# Patient Record
Sex: Female | Born: 1960 | Race: Black or African American | Hispanic: No | Marital: Married | Smoking: Former smoker
Health system: Southern US, Community
[De-identification: ages and names within clinical notes are randomized; demographics above are authoritative.]

## PROBLEM LIST (undated history)

## (undated) DIAGNOSIS — I480 Paroxysmal atrial fibrillation: Secondary | ICD-10-CM

## (undated) DIAGNOSIS — D696 Thrombocytopenia, unspecified: Secondary | ICD-10-CM

## (undated) DIAGNOSIS — I10 Essential (primary) hypertension: Secondary | ICD-10-CM

## (undated) DIAGNOSIS — D259 Leiomyoma of uterus, unspecified: Secondary | ICD-10-CM

## (undated) DIAGNOSIS — E049 Nontoxic goiter, unspecified: Secondary | ICD-10-CM

## (undated) DIAGNOSIS — M1990 Unspecified osteoarthritis, unspecified site: Secondary | ICD-10-CM

## (undated) DIAGNOSIS — D649 Anemia, unspecified: Secondary | ICD-10-CM

## (undated) DIAGNOSIS — N979 Female infertility, unspecified: Secondary | ICD-10-CM

## (undated) DIAGNOSIS — D219 Benign neoplasm of connective and other soft tissue, unspecified: Secondary | ICD-10-CM

## (undated) DIAGNOSIS — E0590 Thyrotoxicosis, unspecified without thyrotoxic crisis or storm: Secondary | ICD-10-CM

## (undated) DIAGNOSIS — J209 Acute bronchitis, unspecified: Secondary | ICD-10-CM

## (undated) DIAGNOSIS — R079 Chest pain, unspecified: Secondary | ICD-10-CM

## (undated) DIAGNOSIS — E876 Hypokalemia: Secondary | ICD-10-CM

## (undated) DIAGNOSIS — I4891 Unspecified atrial fibrillation: Secondary | ICD-10-CM

## (undated) HISTORY — DX: Hypokalemia: E87.6

## (undated) HISTORY — DX: Nontoxic goiter, unspecified: E04.9

## (undated) HISTORY — DX: Thrombocytopenia, unspecified: D69.6

## (undated) HISTORY — DX: Unspecified atrial fibrillation: I48.91

## (undated) HISTORY — DX: Benign neoplasm of connective and other soft tissue, unspecified: D21.9

## (undated) HISTORY — DX: Hypomagnesemia: E83.42

## (undated) HISTORY — DX: Thyrotoxicosis, unspecified without thyrotoxic crisis or storm: E05.90

## (undated) HISTORY — DX: Acute bronchitis, unspecified: J20.9

## (undated) HISTORY — DX: Leiomyoma of uterus, unspecified: D25.9

## (undated) HISTORY — DX: Paroxysmal atrial fibrillation: I48.0

## (undated) HISTORY — DX: Chest pain, unspecified: R07.9

## (undated) HISTORY — DX: Anemia, unspecified: D64.9

## (undated) HISTORY — PX: ABDOMINAL HYSTERECTOMY: SHX81

## (undated) HISTORY — DX: Female infertility, unspecified: N97.9

## (undated) HISTORY — PX: THYROIDECTOMY: SHX17

---

## 2001-10-25 ENCOUNTER — Emergency Department (HOSPITAL_COMMUNITY): Admission: EM | Admit: 2001-10-25 | Discharge: 2001-10-25 | Payer: Self-pay | Admitting: Emergency Medicine

## 2001-10-27 ENCOUNTER — Emergency Department (HOSPITAL_COMMUNITY): Admission: EM | Admit: 2001-10-27 | Discharge: 2001-10-27 | Payer: Self-pay | Admitting: Emergency Medicine

## 2002-07-30 ENCOUNTER — Emergency Department (HOSPITAL_COMMUNITY): Admission: EM | Admit: 2002-07-30 | Discharge: 2002-07-30 | Payer: Self-pay | Admitting: Emergency Medicine

## 2003-01-28 ENCOUNTER — Encounter: Payer: Self-pay | Admitting: Emergency Medicine

## 2003-01-28 ENCOUNTER — Emergency Department (HOSPITAL_COMMUNITY): Admission: EM | Admit: 2003-01-28 | Discharge: 2003-01-28 | Payer: Self-pay | Admitting: Emergency Medicine

## 2003-06-30 ENCOUNTER — Emergency Department (HOSPITAL_COMMUNITY): Admission: EM | Admit: 2003-06-30 | Discharge: 2003-06-30 | Payer: Self-pay | Admitting: Emergency Medicine

## 2004-01-26 ENCOUNTER — Emergency Department (HOSPITAL_COMMUNITY): Admission: EM | Admit: 2004-01-26 | Discharge: 2004-01-26 | Payer: Self-pay | Admitting: Emergency Medicine

## 2004-02-24 ENCOUNTER — Emergency Department (HOSPITAL_COMMUNITY): Admission: EM | Admit: 2004-02-24 | Discharge: 2004-02-24 | Payer: Self-pay | Admitting: Emergency Medicine

## 2005-09-25 ENCOUNTER — Emergency Department (HOSPITAL_COMMUNITY): Admission: EM | Admit: 2005-09-25 | Discharge: 2005-09-25 | Payer: Self-pay | Admitting: Emergency Medicine

## 2005-10-30 ENCOUNTER — Ambulatory Visit: Payer: Self-pay | Admitting: Internal Medicine

## 2006-02-01 ENCOUNTER — Encounter: Payer: Self-pay | Admitting: Internal Medicine

## 2006-02-01 ENCOUNTER — Ambulatory Visit: Payer: Self-pay | Admitting: Internal Medicine

## 2006-05-23 ENCOUNTER — Emergency Department (HOSPITAL_COMMUNITY): Admission: EM | Admit: 2006-05-23 | Discharge: 2006-05-23 | Payer: Self-pay | Admitting: Emergency Medicine

## 2006-09-26 ENCOUNTER — Emergency Department (HOSPITAL_COMMUNITY): Admission: EM | Admit: 2006-09-26 | Discharge: 2006-09-27 | Payer: Self-pay | Admitting: Emergency Medicine

## 2006-10-02 ENCOUNTER — Ambulatory Visit: Payer: Self-pay | Admitting: *Deleted

## 2006-11-30 ENCOUNTER — Emergency Department (HOSPITAL_COMMUNITY): Admission: EM | Admit: 2006-11-30 | Discharge: 2006-11-30 | Payer: Self-pay | Admitting: Emergency Medicine

## 2007-01-24 ENCOUNTER — Emergency Department (HOSPITAL_COMMUNITY): Admission: EM | Admit: 2007-01-24 | Discharge: 2007-01-24 | Payer: Self-pay | Admitting: Emergency Medicine

## 2007-01-27 ENCOUNTER — Emergency Department (HOSPITAL_COMMUNITY): Admission: EM | Admit: 2007-01-27 | Discharge: 2007-01-27 | Payer: Self-pay | Admitting: Emergency Medicine

## 2007-02-26 ENCOUNTER — Encounter (INDEPENDENT_AMBULATORY_CARE_PROVIDER_SITE_OTHER): Payer: Self-pay | Admitting: *Deleted

## 2007-11-22 ENCOUNTER — Emergency Department (HOSPITAL_COMMUNITY): Admission: EM | Admit: 2007-11-22 | Discharge: 2007-11-23 | Payer: Self-pay | Admitting: Physician Assistant

## 2008-08-21 ENCOUNTER — Encounter: Payer: Self-pay | Admitting: Emergency Medicine

## 2008-08-22 ENCOUNTER — Inpatient Hospital Stay (HOSPITAL_COMMUNITY): Admission: AD | Admit: 2008-08-22 | Discharge: 2008-08-24 | Payer: Self-pay | Admitting: Obstetrics and Gynecology

## 2008-08-22 DIAGNOSIS — N739 Female pelvic inflammatory disease, unspecified: Secondary | ICD-10-CM | POA: Insufficient documentation

## 2008-09-09 ENCOUNTER — Encounter (INDEPENDENT_AMBULATORY_CARE_PROVIDER_SITE_OTHER): Payer: Self-pay | Admitting: Family Medicine

## 2008-09-09 ENCOUNTER — Ambulatory Visit: Payer: Self-pay | Admitting: Obstetrics and Gynecology

## 2008-10-19 ENCOUNTER — Ambulatory Visit (HOSPITAL_COMMUNITY): Admission: RE | Admit: 2008-10-19 | Discharge: 2008-10-19 | Payer: Self-pay | Admitting: Family Medicine

## 2008-10-21 ENCOUNTER — Ambulatory Visit: Payer: Self-pay | Admitting: Internal Medicine

## 2008-10-21 DIAGNOSIS — N979 Female infertility, unspecified: Secondary | ICD-10-CM

## 2008-10-21 HISTORY — DX: Female infertility, unspecified: N97.9

## 2008-10-21 LAB — CONVERTED CEMR LAB
Bilirubin Urine: NEGATIVE
Blood in Urine, dipstick: NEGATIVE
Nitrite: NEGATIVE
Protein, U semiquant: NEGATIVE
Specific Gravity, Urine: 1.025
WBC Urine, dipstick: NEGATIVE

## 2008-11-04 ENCOUNTER — Encounter (INDEPENDENT_AMBULATORY_CARE_PROVIDER_SITE_OTHER): Payer: Self-pay | Admitting: Internal Medicine

## 2008-12-01 ENCOUNTER — Ambulatory Visit: Payer: Self-pay | Admitting: Obstetrics & Gynecology

## 2009-01-05 ENCOUNTER — Emergency Department (HOSPITAL_COMMUNITY): Admission: EM | Admit: 2009-01-05 | Discharge: 2009-01-05 | Payer: Self-pay | Admitting: Family Medicine

## 2009-01-23 ENCOUNTER — Emergency Department (HOSPITAL_COMMUNITY): Admission: EM | Admit: 2009-01-23 | Discharge: 2009-01-23 | Payer: Self-pay | Admitting: Emergency Medicine

## 2009-06-30 ENCOUNTER — Ambulatory Visit: Payer: Self-pay | Admitting: Internal Medicine

## 2009-06-30 DIAGNOSIS — R109 Unspecified abdominal pain: Secondary | ICD-10-CM

## 2009-06-30 LAB — CONVERTED CEMR LAB
Basophils Relative: 0 % (ref 0–1)
Beta hcg, urine, semiquantitative: NEGATIVE
Eosinophils Absolute: 0.2 10*3/uL (ref 0.0–0.7)
Eosinophils Relative: 2 % (ref 0–5)
HCT: 39.3 % (ref 36.0–46.0)
Lymphs Abs: 2.5 10*3/uL (ref 0.7–4.0)
MCHC: 31.8 g/dL (ref 30.0–36.0)
MCV: 86.9 fL (ref 78.0–100.0)
Monocytes Relative: 10 % (ref 3–12)
Nitrite: NEGATIVE
Platelets: 226 10*3/uL (ref 150–400)
Urobilinogen, UA: 0.2
WBC Urine, dipstick: NEGATIVE
WBC: 6.7 10*3/uL (ref 4.0–10.5)
Whiff Test: NEGATIVE
pH: 5

## 2009-07-07 ENCOUNTER — Ambulatory Visit (HOSPITAL_COMMUNITY): Admission: RE | Admit: 2009-07-07 | Discharge: 2009-07-07 | Payer: Self-pay | Admitting: Internal Medicine

## 2009-07-08 ENCOUNTER — Telehealth (INDEPENDENT_AMBULATORY_CARE_PROVIDER_SITE_OTHER): Payer: Self-pay | Admitting: Internal Medicine

## 2009-07-11 DIAGNOSIS — D259 Leiomyoma of uterus, unspecified: Secondary | ICD-10-CM | POA: Insufficient documentation

## 2009-07-11 HISTORY — DX: Leiomyoma of uterus, unspecified: D25.9

## 2010-07-06 ENCOUNTER — Emergency Department (HOSPITAL_COMMUNITY)
Admission: EM | Admit: 2010-07-06 | Discharge: 2010-07-06 | Payer: Self-pay | Source: Home / Self Care | Admitting: Emergency Medicine

## 2010-07-06 LAB — URINALYSIS, ROUTINE W REFLEX MICROSCOPIC
Hgb urine dipstick: NEGATIVE
Specific Gravity, Urine: 1.028 (ref 1.005–1.030)
Urine Glucose, Fasting: NEGATIVE mg/dL
pH: 6 (ref 5.0–8.0)

## 2010-07-06 LAB — CBC
HCT: 37.7 % (ref 36.0–46.0)
Hemoglobin: 12.5 g/dL (ref 12.0–15.0)
MCV: 86.3 fL (ref 78.0–100.0)
WBC: 5.6 10*3/uL (ref 4.0–10.5)

## 2010-07-06 LAB — POCT I-STAT, CHEM 8
Calcium, Ion: 1.14 mmol/L (ref 1.12–1.32)
Creatinine, Ser: 0.9 mg/dL (ref 0.4–1.2)
Glucose, Bld: 110 mg/dL — ABNORMAL HIGH (ref 70–99)
HCT: 40 % (ref 36.0–46.0)
Hemoglobin: 13.6 g/dL (ref 12.0–15.0)
Potassium: 3.9 mEq/L (ref 3.5–5.1)
Sodium: 140 mEq/L (ref 135–145)

## 2010-07-06 LAB — POCT PREGNANCY, URINE: Preg Test, Ur: NEGATIVE

## 2010-07-06 LAB — DIFFERENTIAL
Basophils Relative: 0 % (ref 0–1)
Eosinophils Absolute: 0.2 10*3/uL (ref 0.0–0.7)
Eosinophils Relative: 3 % (ref 0–5)
Lymphs Abs: 1.8 10*3/uL (ref 0.7–4.0)
Monocytes Relative: 6 % (ref 3–12)

## 2010-07-06 LAB — WET PREP, GENITAL: Yeast Wet Prep HPF POC: NONE SEEN

## 2010-07-07 ENCOUNTER — Emergency Department (HOSPITAL_COMMUNITY)
Admission: EM | Admit: 2010-07-07 | Discharge: 2010-07-07 | Payer: Self-pay | Source: Home / Self Care | Admitting: Emergency Medicine

## 2010-07-07 LAB — URINALYSIS, ROUTINE W REFLEX MICROSCOPIC
Hgb urine dipstick: NEGATIVE
Nitrite: NEGATIVE
Specific Gravity, Urine: 1.024 (ref 1.005–1.030)
Urobilinogen, UA: 0.2 mg/dL (ref 0.0–1.0)

## 2010-07-07 LAB — CBC
Hemoglobin: 12.6 g/dL (ref 12.0–15.0)
MCH: 28.3 pg (ref 26.0–34.0)
MCHC: 33.1 g/dL (ref 30.0–36.0)
Platelets: 237 10*3/uL (ref 150–400)
RBC: 4.45 MIL/uL (ref 3.87–5.11)
RDW: 14.1 % (ref 11.5–15.5)

## 2010-07-07 LAB — COMPREHENSIVE METABOLIC PANEL
ALT: 21 U/L (ref 0–35)
AST: 24 U/L (ref 0–37)
Alkaline Phosphatase: 83 U/L (ref 39–117)
CO2: 23 mEq/L (ref 19–32)
Chloride: 104 mEq/L (ref 96–112)
GFR calc non Af Amer: >60 mL/min (ref >60)
Sodium: 134 mEq/L — ABNORMAL LOW (ref 135–145)
Total Bilirubin: 0.3 mg/dL (ref 0.3–1.2)

## 2010-07-07 LAB — DIFFERENTIAL
Basophils Absolute: 0 10*3/uL (ref 0.0–0.1)
Basophils Relative: 0 % (ref 0–1)
Eosinophils Absolute: 0.1 10*3/uL (ref 0.0–0.7)
Eosinophils Relative: 1 % (ref 0–5)
Lymphs Abs: 1.6 10*3/uL (ref 0.7–4.0)

## 2010-07-07 LAB — GC/CHLAMYDIA PROBE AMP, GENITAL: Chlamydia, DNA Probe: NEGATIVE

## 2010-07-07 LAB — LIPASE, BLOOD: Lipase: 13 U/L (ref 11–59)

## 2010-07-11 NOTE — Assessment & Plan Note (Signed)
Summary: STOMACH HURTING REALLY BAD//SS   Vital Signs:  Patient profile:   50 year old female Menstrual status:  regular LMP:     05/11/2009 Weight:      268. pounds Temp:     97.2 degrees F oral Pulse rate:   66 / minute Pulse rhythm:   regular Resp:     18 per minute BP sitting:   133 / 82  (left arm) Cuff size:   large  Vitals Entered By: Geanie Cooley  (June 30, 2009 4:18 PM) CC: pt states she has been having stomach painson her right side since December, she thought it was from her cycle and it was just cramping but it has continued. Pt states that she wants to see a fertility doctor about helping her to get pregnant, pt states she wants to see if maybe her ovaries are blocked. Pain Assessment Patient in pain? yes     Location: abdomen Intensity: 8 Type: sharp Onset of pain  december  Does patient need assistance? Functional Status Self care Ambulation Normal Comments pt states she wanted to go to a fertility clinic, she was to obtain the doctor information and Dr. Delrae Alfred will see about referring her to one. Pt gave me information for a [facility]rolog[facility] Dr. Lynelle Smoke  not a fertility doctor call pt back and left message to call us back. LMP (date): 05/11/2009     Menstrual Status regular Enter LMP: 05/11/2009 Last PAP Result NEGATIVE FOR INTRAEPITHELIAL LESIONS OR MALIGNANCY.   CC:  pt states she has been having stomach painson her right side since December, she thought it was from her cycle and it was just cramping but it has continued. Pt states that she wants to see a fertility doctor about helping her to get pregnant, and pt states she wants to see if maybe her ovaries are blocked.Olivia Schmidt  History of Present Illness: 1.  Right sided pelvic/ lower abdominal pains since Christmas.  Feels like menstrual cramps.  Comes and goes.  Has at least every other day.  Can last anywhere from an hour to 24 hours.  LMP was the 1st of December.  Has not had a  period yet this month.  Feels like she needs to start her period.  No breast tenderness.  Was pregnant at age 64, but cannot recall what it felt like.  No nausea, diarrhea, constipation.  No dysuria.  No improvement with BM or urinating.  No vaginal discharge.  No fever.  This does not feel like it did with PID.    2.  Wants to go to a fertility specialist.  Discussed reality of having PID, advanced age, never able to get pregnant in past 15 years of marriage and concern for the cost, which generally is all out of pocket and quite expensive.  Pt. states her husband is seeing a specialist who has him on an unknown med to increase sperm count.  She can go to him as well.  She just needs to send her records.  Allergies (verified): No Known Drug Allergies  Physical Exam  General:  Morbidly obese, NAD Lungs:  Normal respiratory effort, chest expands symmetrically. Lungs are clear to auscultation, no crackles or wheezes. Heart:  Normal rate and regular rhythm. S1 and S2 normal without gallop, murmur, click, rub or other extra sounds. Abdomen:  No flank tenderness.  Soft, +BS, no HSM, no mass.  Tender RLQ--,mild Genitalia:  Scant thin white discharge.  No odor.  No cervical lesion.  No CMT.  Tender right adnexa, no mass appreciated, but limited with pt's size.  No uterine or left adnexal mass or tenderness.   Impression & Recommendations:  Problem # 1:  PELVIC PAIN, RIGHT (ICD-789.09) Suspect ovarian cyst. Unlikely an ectopic Orders: Urine Pregnancy Test  (16109) UA Dipstick w/o Micro (manual) (60454) T-CBC w/Diff (09811-91478) T- GC Chlamydia (29562) Pelvic ultrasound  Problem # 2:  FEMALE INFERTILITY (ICD-628.9) Long discussion regarding the reality of difficulty getting pregnant at this stage with age, husband's issues, and hx of PID.   Pt. just wants to see if any possibility. Counseled on adoption if unable   Other Orders: Ultrasound (Ultrasound)  Patient Instructions: 1)  Call  with name of fertility specialist  Laboratory Results   Urine Tests  Date/Time Received: June 30, 2009 6:10 PM   Routine Urinalysis   Color: lt. yellow Appearance: Clear Glucose: negative   (Normal Range: Negative) Bilirubin: negative   (Normal Range: Negative) Ketone: smal (15)   (Normal Range: Negative) Spec. Gravity: >=1.030   (Normal Range: 1.003-1.035) Blood: trace-intact   (Normal Range: Negative) pH: 5.0   (Normal Range: 5.0-8.0) Protein: negative   (Normal Range: Negative) Urobilinogen: 0.2   (Normal Range: 0-1) Nitrite: negative   (Normal Range: Negative) Leukocyte Esterace: negative   (Normal Range: Negative)    Urine HCG: negative   Wet Mount/KOH Source: vaginal WBC/hpf: 1-5 Bacteria/hpf: 1+ Clue cells/hpf: none  Negative whiff Yeast/hpf: none Trichomonas/hpf: none   Laboratory Results   Urine Tests    Routine Urinalysis   Color: lt. yellow Appearance: Clear Glucose: negative   (Normal Range: Negative) Bilirubin: negative   (Normal Range: Negative) Ketone: smal (15)   (Normal Range: Negative) Spec. Gravity: >=1.030   (Normal Range: 1.003-1.035) Blood: trace-intact   (Normal Range: Negative) pH: 5.0   (Normal Range: 5.0-8.0) Protein: negative   (Normal Range: Negative) Urobilinogen: 0.2   (Normal Range: 0-1) Nitrite: negative   (Normal Range: Negative) Leukocyte Esterace: negative   (Normal Range: Negative)    Urine HCG: negative   Wet Mount  Negative whiff Wet Mount KOH: Negative     Vital Signs:  Patient profile:   50 year old female Menstrual status:  regular LMP:     05/11/2009 Weight:      268. pounds Temp:     97.2 degrees F oral Pulse rate:   66 / minute Pulse rhythm:   regular Resp:     18 per minute BP sitting:   133 / 82  (left arm) Cuff size:   large  Vitals Entered By: Geanie Cooley  (June 30, 2009 4:18 PM)

## 2010-07-11 NOTE — Progress Notes (Signed)
Summary: Requesting for lab results   Phone Note Call from Patient Call back at Home Phone [phone]   Summary of Call: Pt wants to get her lab results.  Blakelynn Scheeler Md Initial call taken by: Manon Hilding,  July 08, 2009 3:46 PM  Follow-up for Phone Call        fwd for results. Follow-up by: Vesta Mixer CMA,  July 08, 2009 3:53 PM  Additional Follow-up for Phone Call Additional follow up Details #1::        Let her know her labs were fine.  Her pelvic ultrasound showed 3 fibroids of the uterus which are unchanged from previous studies--when she had the CT of her pelvis at the hospital, these were noted--they are not cancerous.   See if she is still having pelvic discomfort. Did she ever get the name of the fertility specialist as well--last I heard, he may have been a urologist, who would not do female infertility Additional Follow-up by: Julieanne Manson MD,  July 11, 2009 1:54 PM    Additional Follow-up for Phone Call Additional follow up Details #2::    Pt notified.  She said she is still having some pelvic discomfort.  She will look for name of fertility doctor. Follow-up by: Vesta Mixer CMA,  July 11, 2009 2:59 PM  Additional Follow-up for Phone Call Additional follow up Details #3:: Details for Additional Follow-up Action Taken: She actually was going to give me the name of the fertility specialist her husband goes to--was that a urologist?  Julieanne Manson MD  July 12, 2009 6:03 PM   Spoke with pt her husband doctor name is Dr. Lynelle Smoke L. Tannebaum............... Vesta Mixer CMA  July 15, 2009 5:07 PM  Please let her know that that he is not a fertility physician--he is a urologist and the urologists are also refusing to see our patients.  Julieanne Manson MD  July 21, 2009 6:10 PM   Pt notified and she will look around for a fertility specialist. Additional Follow-up by: Vesta Mixer CMA,  July 26, 2009 9:09 AM

## 2010-07-11 NOTE — Letter (Signed)
Summary: REFERRAL/ULTRASOUND  REFERRAL/ULTRASOUND   Imported By: Arta Bruce 08/23/2009 12:52:11  _____________________________________________________________________  External Attachment:    Type:   Image     Comment:   External Document

## 2010-08-29 ENCOUNTER — Inpatient Hospital Stay (HOSPITAL_COMMUNITY)
Admission: AD | Admit: 2010-08-29 | Discharge: 2010-08-29 | Disposition: A | Discharge status: Home / Self Care | Payer: Self-pay | Source: Ambulatory Visit | Attending: Obstetrics and Gynecology | Admitting: Obstetrics and Gynecology

## 2010-08-29 DIAGNOSIS — R1031 Right lower quadrant pain: Secondary | ICD-10-CM

## 2010-08-29 DIAGNOSIS — D259 Leiomyoma of uterus, unspecified: Secondary | ICD-10-CM | POA: Insufficient documentation

## 2010-09-11 ENCOUNTER — Other Ambulatory Visit: Payer: Self-pay | Admitting: Family Medicine

## 2010-09-11 ENCOUNTER — Encounter: Payer: Self-pay | Admitting: Occupational Therapy

## 2010-09-11 ENCOUNTER — Other Ambulatory Visit: Payer: Self-pay | Admitting: Obstetrics & Gynecology

## 2010-09-11 DIAGNOSIS — N949 Unspecified condition associated with female genital organs and menstrual cycle: Secondary | ICD-10-CM

## 2010-09-11 DIAGNOSIS — Z1231 Encounter for screening mammogram for malignant neoplasm of breast: Secondary | ICD-10-CM

## 2010-09-11 DIAGNOSIS — D259 Leiomyoma of uterus, unspecified: Secondary | ICD-10-CM

## 2010-09-12 NOTE — Progress Notes (Signed)
NAMEERIENNE, SPELMAN NO.:  [id number]  MEDICAL RECORD NO.:  [id number]           PATIENT TYPE:  A  LOCATION:  [facility]                   FACILITY:  [facility]  PHYSICIAN:  Allie Bossier, MD        DATE OF BIRTH:  1960-06-30  DATE OF SERVICE:  09/11/2010                                 CLINIC NOTE  IDENTIFICATION:  [name] is a 50 year old married black G1, P0, A1 who comes here complaining of painful periods.  Her periods have become significantly heavier and her pelvic pain (right greater than left) is daily.  She has been given Percocet and diclofenac which she has recently run out of it, she is currently using Aleve 3 times a day.  An ultrasound was done as they entered the [facility]U on July 07, 2010, and it showed a uterus that measures 10 x 5.6 x 6.2 with several fibroids, one of which in the fundus measures 5.8 x 5.3 x 5.4 cm.  There are other smaller fibroid some was partial submucosal component.  Her endometrium is normal at 6.4 mm and both ovaries are normal.  Other finding shows a trace of simple free fluid in the cul-de-sac.  PAST MEDICAL HISTORY:  Morbid obesity, fibroids, and chronic pelvic pain for last 10 years.  REVIEW OF SYSTEMS:  She has been married for last 18 years.  She has had dyspareunia for at least 10 of those years.  She has been having unprotected intercourse since she was 50 years old.  Her husband has had lymphoma and status post chemotherapy, but he seems to be doing well now.  She works as a Lawyer in [place].  Her last Pap smear was at [facility], and is currently due and it was always normal.  She says her mammogram is due and that her last mammogram was abnormal, but she did not followup.  PAST SURGICAL HISTORY:  She has elective AB at age 78.  FAMILY HISTORY:  Negative for breast, GYN, and colon malignancies.  No known drug allergies.  No latex allergies.  SOCIAL HISTORY:  She quit smoking 10 years ago but prior to that had  a 40 pack-year history.  MEDICATIONS: 1. She takes Aleve on a p.r.n. basis. 2. She recently finished Percocet and diclofenac.  PHYSICAL EXAMINATION:  GENERAL:  A well-nourished, well-hydrated very pleasant obese black female. VITAL SIGNS:  Weight 271 pounds, blood pressure 131/79, pulse 68, afebrile. HEENT:  Normal. HEART:  Regular rate and rhythm. LUNGS:  Clear to auscultation bilaterally. BREASTS:  Normal bilaterally. ABDOMEN:  Obese and tense. EXTERNAL GENITALIA:  Normal.  Cervix appears normal and a Pap smear was obtained.  Bimanual exam is essentially noncontributory since I am not able to feel her pelvic organs due to her centripetal obesity and the tense nature of her abdomen, her exam is however nontender.  ASSESSMENT AND PLAN: 1. Annual exam.  We will check her Pap smear, scheduled a mammogram.     Recommended weight loss, self-breast exam, and self-vulvar exam. 2. Symptomatic uterine fibroids and chronic pelvic pain.  After     discussion of risks, benefits, and alternatives, she  prefers to     have the robotic hysterectomy.  I have explained that I would     recommend removing her ovaries as well since she does have chronic     pelvic pain.  The surgery be scheduled in May and will await the     results of her Pap smear and mammogram.     Allie Bossier, MD    MCD/MEDQ  D:  09/11/2010  T:  09/12/2010  Job:  [id number]

## 2010-09-19 ENCOUNTER — Ambulatory Visit (HOSPITAL_COMMUNITY)
Admission: RE | Admit: 2010-09-19 | Discharge: 2010-09-19 | Disposition: A | Discharge status: Home / Self Care | Payer: Self-pay | Source: Ambulatory Visit | Attending: Family Medicine | Admitting: Family Medicine

## 2010-09-19 DIAGNOSIS — Z1231 Encounter for screening mammogram for malignant neoplasm of breast: Secondary | ICD-10-CM | POA: Insufficient documentation

## 2010-09-21 LAB — CBC
HCT: 32.3 % — ABNORMAL LOW (ref 36.0–46.0)
Hemoglobin: 10.9 g/dL — ABNORMAL LOW (ref 12.0–15.0)
Hemoglobin: 12.4 g/dL (ref 12.0–15.0)
Hemoglobin: 10.6 g/dL — ABNORMAL LOW (ref 12.0–15.0)
MCV: 86.9 fL (ref 78.0–100.0)
Platelets: 153 10*3/uL (ref 150–400)
RBC: 3.8 MIL/uL — ABNORMAL LOW (ref 3.87–5.11)
RBC: 4.3 MIL/uL (ref 3.87–5.11)
RBC: 3.7 MIL/uL — ABNORMAL LOW (ref 3.87–5.11)
WBC: 8.3 10*3/uL (ref 4.0–10.5)
WBC: 14.3 10*3/uL — ABNORMAL HIGH (ref 4.0–10.5)
WBC: 13.4 10*3/uL — ABNORMAL HIGH (ref 4.0–10.5)

## 2010-09-21 LAB — URINALYSIS, ROUTINE W REFLEX MICROSCOPIC
Glucose, UA: NEGATIVE mg/dL
Specific Gravity, Urine: 1.031 — ABNORMAL HIGH (ref 1.005–1.030)
Urobilinogen, UA: 1 mg/dL (ref 0.0–1.0)
pH: 5.5 (ref 5.0–8.0)

## 2010-09-21 LAB — LIPASE, BLOOD: Lipase: 13 U/L (ref 11–59)

## 2010-09-21 LAB — DIFFERENTIAL
Basophils Absolute: 0.1 10*3/uL (ref 0.0–0.1)
Basophils Relative: 1 % (ref 0–1)
Basophils Relative: 1 % (ref 0–1)
Eosinophils Absolute: 0 10*3/uL (ref 0.0–0.7)
Eosinophils Relative: 0 % (ref 0–5)
Eosinophils Relative: 0 % (ref 0–5)
Lymphocytes Relative: 14 % (ref 12–46)
Lymphocytes Relative: 7 % — ABNORMAL LOW (ref 12–46)
Lymphs Abs: 1.1 10*3/uL (ref 0.7–4.0)
Lymphs Abs: 0.9 10*3/uL (ref 0.7–4.0)
Monocytes Absolute: 0.8 10*3/uL (ref 0.1–1.0)
Monocytes Absolute: 1 10*3/uL (ref 0.1–1.0)
Monocytes Relative: 10 % (ref 3–12)
Neutro Abs: 6.2 10*3/uL (ref 1.7–7.7)
Neutrophils Relative %: 74 % (ref 43–77)

## 2010-09-21 LAB — URINE CULTURE

## 2010-09-21 LAB — COMPREHENSIVE METABOLIC PANEL
ALT: 14 U/L (ref 0–35)
AST: 17 U/L (ref 0–37)
CO2: 23 mEq/L (ref 19–32)
Chloride: 102 mEq/L (ref 96–112)
Creatinine, Ser: 0.83 mg/dL (ref 0.4–1.2)
GFR calc Af Amer: >60 mL/min (ref >60)
GFR calc non Af Amer: >60 mL/min (ref >60)
Sodium: 132 mEq/L — ABNORMAL LOW (ref 135–145)
Total Bilirubin: 1.1 mg/dL (ref 0.3–1.2)

## 2010-09-21 LAB — URINE MICROSCOPIC-ADD ON

## 2010-09-21 LAB — CA 125: CA 125: 20.8 U/mL (ref 0.0–30.2)

## 2010-09-22 ENCOUNTER — Inpatient Hospital Stay (HOSPITAL_COMMUNITY)
Admission: AD | Admit: 2010-09-22 | Discharge: 2010-09-22 | Disposition: A | Discharge status: Home / Self Care | Payer: Self-pay | Source: Ambulatory Visit | Attending: Obstetrics & Gynecology | Admitting: Obstetrics & Gynecology

## 2010-09-22 DIAGNOSIS — R109 Unspecified abdominal pain: Secondary | ICD-10-CM | POA: Insufficient documentation

## 2010-09-22 DIAGNOSIS — D259 Leiomyoma of uterus, unspecified: Secondary | ICD-10-CM | POA: Insufficient documentation

## 2010-10-18 ENCOUNTER — Ambulatory Visit: Payer: Self-pay | Admitting: Obstetrics & Gynecology

## 2010-10-18 ENCOUNTER — Other Ambulatory Visit: Payer: Self-pay | Admitting: Obstetrics & Gynecology

## 2010-10-18 ENCOUNTER — Encounter (HOSPITAL_COMMUNITY): Payer: Self-pay | Attending: Obstetrics & Gynecology

## 2010-10-18 DIAGNOSIS — Z01812 Encounter for preprocedural laboratory examination: Secondary | ICD-10-CM | POA: Insufficient documentation

## 2010-10-18 DIAGNOSIS — Z124 Encounter for screening for malignant neoplasm of cervix: Secondary | ICD-10-CM

## 2010-10-18 DIAGNOSIS — N949 Unspecified condition associated with female genital organs and menstrual cycle: Secondary | ICD-10-CM

## 2010-10-18 DIAGNOSIS — Z01818 Encounter for other preprocedural examination: Secondary | ICD-10-CM | POA: Insufficient documentation

## 2010-10-18 LAB — CBC
HCT: 38.5 % (ref 36.0–46.0)
MCV: 87.5 fL (ref 78.0–100.0)
RDW: 14.2 % (ref 11.5–15.5)
WBC: 7.7 10*3/uL (ref 4.0–10.5)

## 2010-10-19 NOTE — Group Therapy Note (Signed)
NAMERUPINDER, LIVINGSTON               ACCOUNT NO.:  [id number]  MEDICAL RECORD NO.:  [id number]           PATIENT TYPE:  O  LOCATION:  [facility]                    [facility]  PHYSICIAN:  Allie Bossier, MD        DATE OF BIRTH:  1960/06/25  DATE OF SERVICE:  10/18/2010                                 CLINIC NOTE  [name] is a 50 year old married black G1, P0, A1 who is scheduled for robotic laparoscopic T hysterectomy and BSO next week.  She comes in because she needed a followup Pap smear.  Her past Pap smear which was done on September 11, 2010, came back with Trichomonas as well as absent transformational/endocervical cells.  She says that she and her partner have both taken the Flagyl, and she thinks that the vaginal smell has resolved.  On exam today, I am doing a wet prep.  I do not see any abnormal discharge.  We did her Pap smear as well.  She is requesting a refill of her Percocet and says that she would prefer the 10 mg over the 5 since the 5 is not helping her pain.  We once again discussed her surgery.  I have given her 40 of the Percocet 10 mg/325 mg.  These will be used preoperatively and postoperatively again risks of surgery explained, understood, and accepted.     Allie Bossier, MD    MCD/MEDQ  D:  10/18/2010  T:  10/19/2010  Job:  [id number]

## 2010-10-24 NOTE — Group Therapy Note (Signed)
Olivia Schmidt, Schmidt NO.:  [id number]   MEDICAL RECORD NO.:  [id number]          PATIENT TYPE:  WOC   LOCATION:  [facility]                   FACILITY:  [facility]   PHYSICIAN:  Argentina Donovan, MD        DATE OF BIRTH:  04-08-1961   DATE OF SERVICE:  09/09/2008                                  CLINIC NOTE   REASON FOR VISIT:  Follow-up for hospitalization for PID.   HISTORY OF PRESENT ILLNESS:  [name] is a 50 year old gravida  1, para 0 0 1 0 who is here for follow-up for recent hospitalization for  PID.  The patient presented to the [facility] on August 21, 2008 with acutely  worsening abdominal pain and fevers. An ultrasound revealed a 4 cm  fibroid as well as a left ovarian complex cystic lesion with thick  septations approximately 6x7 cm.  Initially she had a white count of  14,000.  Patient was hospitalized and treated with antibiotics with  which she improved. She went home on 2-week course of doxycycline and  Flagyl.  She is here today for follow-up.  She relates improvement her  pain, although she does have some mid epigastric pain which existed  prior to this episode.  She notes a thick whitish vaginal discharge with  some redness and itchiness of her vaginal area.   PAST MEDICAL HISTORY:  None.   PAST SURGICAL HISTORY:  I and D  of some abscesses.   PAST OBSTETRICAL HISTORY:  She had elective termination.   PAST GYN HISTORY:  She has a remote history of gonorrhea at age17.  She  has not had a Pap smear in over 5 years.   SOCIAL HISTORY:  She denies tobacco and alcohol.   MEDICATIONS:  She states she is not taking any medications.   ALLERGIES:  She has no known allergies.   REVIEW OF SYSTEMS:  The patient relates a long history of infertility  for which she has previously been evaluated and states that she recalled  she had one tube blocked. Otherwise she had no problems.  She relates  monthly regular menstrual cycles.  She also does note that on  one  occasion her husband tried to have semen analysis, but it was reported  that he did not produce enough semen for them to do the analysis.   PHYSICAL EXAM:  VITAL SIGNS:  Blood pressure is 123/66, heart rate 68,  temperature 97, respiratory rate 20.  She is 5 feet 4 inches tall and  weighs 245 pounds.  GENERAL:  She is healthy-appearing, in no acute distress.  LUNGS:  Clear to auscultation.  CARDIOVASCULAR:  Regular.  ABDOMEN:  Soft. Some mild midepigastric tenderness.  No lower quadrant  tenderness.  GU: Her external genitalia is normal. Vaginal mucosa is normal.  She has  some thick whitish vaginal discharge.  Cervix is nulliparous-appearing  without lesion. There is no cervical motion tenderness and no mass could  be appreciated in the adnexal areas.  Uterus is normal size.  BREASTS: Her breasts were large and pendulous without evidence of  discrete masses or  nodules.  She had no axillary adenopathy.   ASSESSMENT/PLAN:  1. Tubo-ovarian abscess. This appears to be resolved. Most likely      etiology of her complex cystic mass.  However, given that large      cyst a follow-up ultrasound will be scheduled in 6 weeks from now      to document resolution of the cyst.  2. Infertility.  Given the patient's history I gave her a prescription      for her husband to have a semen analysis.  3. Vaginal candidiasis. Prescription for Diflucan was given.  4. Gastroesophageal reflux disease.  I gave the patient prescription      for a 1 month trial of Pepcid and told her that she is to follow up      with her primary care Kongmeng Santoro for further management of this      problem.  Pap smear was done today and the patient was also given      an appointment for a mammogram as well.     ______________________________  Odie Sera, D.O.    ______________________________  Argentina Donovan, MD    MC/MEDQ  D:  09/09/2008  T:  09/09/2008  Job:  [id number]

## 2010-10-24 NOTE — Discharge Summary (Signed)
Olivia Schmidt, Olivia Schmidt               ACCOUNT NO.:  [id number]   MEDICAL RECORD NO.:  [id number]          PATIENT TYPE:  INP   LOCATION:  9306                          [facility]   PHYSICIAN:  Tanya S. Shawnie Pons, M.D.   DATE OF BIRTH:  Oct 11, 1960   DATE OF ADMISSION:  08/22/2008  DATE OF DISCHARGE:  08/24/2008                               DISCHARGE SUMMARY   DIAGNOSES:  1. Pelvic inflammatory disease with a left tubo-ovarian abscess.  2. Obesity.  3. Infertility.  4. Abdominal pain.  5. Nausea, vomiting, and diarrhea.   PERTINENT LABORATORY DATA:  A CA-125 at 20.8.  Hemoglobin is 14.3, that  was down to 8 prior to discharge.  Hemoglobin of 10.6 and platelets of  153.   PERTINENT RADIOLOGY FINDINGS:  Partial ileus on flat and upright CT that  reveals a left complex mass separate from the sigmoid, pelvic sonogram  reveals a complex left cystic mass with air fluid levels and internal  debris consistent with TOA.   REASON FOR ADMISSION:  Briefly, please see H and P on chart.  The  patient was admitted from [facility] to Dr. Maxie Better' service.  She was found to have pretty significant cervical  motion tenderness, abdominal pain, white count, low grade fever of  100.4, and a left adnexal mass.  She was admitted for presenting to [facility]  for IV antibiotics.   HOSPITAL COURSE:  The patient was admitted to the third floor of [facility].  She was put on Mefoxin and doxycycline, which were continued  IV for 48 hours.  She was afebrile x48 hours.  She was tolerating  regular diet.  She had a much lower white count, left abdominal pain,  was discontinued off PCA.  On hospital day 2, for p.o. Percocet and it  was felt that the patient was stable for discharge at that time.   DISCHARGE DISPOSITION AND CONDITION:  Discharged home in good condition  or in improved condition.  Follow up in the OB/GYN clinic on August 09, 2008 at 1:45 p.m.   DISCHARGE MEDICATIONS:  1.  Percocet 5/325 one to two p.o. q.4-6 h. p.r.n. pain, #40 given.  2. Doxycycline 100 mg p.o. b.i.d. x14 days.  3. Flagyl 500 mg p.o. b.i.d. x14 days.  4. The patient is not to have sexual activity.  She is allowed to      resume work on Thursday if she feels up to it.     [name]. Shawnie Pons, M.D.  Electronically Signed    TSP/MEDQ  D:  08/24/2008  T:  08/25/2008  Job:  [id number]

## 2010-10-24 NOTE — Group Therapy Note (Signed)
Olivia Schmidt, Olivia Schmidt NO.:  [id number]   MEDICAL RECORD NO.:  [id number]          PATIENT TYPE:  WOC   LOCATION:  [facility]                   [facility]ACILITY:  [facility]   PHYSICIAN:  Allie Bossier, MD        DATE OF BIRTH:  [birth year]   DATE OF SERVICE:  12/01/2008                                  CLINIC NOTE   CHIEF COMPLAINT:  Follow up results.   HISTORY OF PRESENT ILLNESS:  Patient is a 50 year old G1, P0-0-1-0,  status post elective abortion at age 61, who presents for follow-up  ultrasound after recent hospitalization for PID and left tubo-ovarian  abscess.  The previous ultrasound showed a left ovarian complex cystic  lesion with thick septations, consistent with an abscess.  Given the  size of this, it was recommended for a 6-week follow-up ultrasounds to  assure resolutions.  That was obtained on May 11th and showed complete  interval resolution of left TOA.  It also still showed a fibroid uterus  with 2 subserosal fibroids measuring about 2 cm each.  Endometrial  stripe was within normal limits.  The patient reports feeling fine and  not having any other fevers, chills, or abdominal pain.  She does  endorse no further complications and states she has completed her  antibiotics.   Patient also has a longstanding history of infertility.  She states that  she has been trying to get pregnant with her husband for the last  several years and does not think she has undergone any workup but does  believe her husband underwent semen analysis but is unsure of the  results.  She desires to pursue fertility further today.   Past medical history, surgical history, social history and meds reviewed  and not otherwise significant.   PREVENTATIVE HEALTH:  She did have a Pap smear in April of this year,  which was normal, and a mammogram the same month, which was normal.   PHYSICAL EXAMINATION:  Temperature 97.6, pulse 84, respiratory rate 24,  blood pressure 112/80.  Weight  255.9 pounds.  GENERAL:  An African American female in no acute distress sitting in the  chair.  ABDOMEN:  Soft, nontender, nondistended.  Normoactive bowel sounds.   ASSESSMENT/PLAN:  1. Left tubo-ovarian abscess, complete resolution of abscess, as      witnessed by repeat ultrasound.  2. Infertility:  As patient desires further evaluation for this issue,      we provided her with a number for reproductive endocrinologist.  We      did advise the patient that this would be expensive and possibly      not successful, but      patient states that she still would like to pursue this.  3. Patient is to return to the clinic in 1 year for well-woman exam.      Allie Bossier, MD    ______________________________  Allie Bossier, MD    MCD/MEDQ  D:  12/01/2008  T:  12/01/2008  Job:  [id number]

## 2010-10-25 ENCOUNTER — Ambulatory Visit (HOSPITAL_COMMUNITY)
Admission: AD | Admit: 2010-10-25 | Discharge: 2010-10-25 | Disposition: A | Discharge status: Home / Self Care | Payer: Self-pay | Source: Ambulatory Visit | Attending: Obstetrics & Gynecology | Admitting: Obstetrics & Gynecology

## 2010-10-25 ENCOUNTER — Other Ambulatory Visit: Payer: Self-pay | Admitting: Obstetrics & Gynecology

## 2010-10-25 DIAGNOSIS — Z01818 Encounter for other preprocedural examination: Secondary | ICD-10-CM | POA: Insufficient documentation

## 2010-10-25 DIAGNOSIS — N949 Unspecified condition associated with female genital organs and menstrual cycle: Secondary | ICD-10-CM

## 2010-10-25 DIAGNOSIS — N946 Dysmenorrhea, unspecified: Secondary | ICD-10-CM | POA: Insufficient documentation

## 2010-10-25 DIAGNOSIS — D251 Intramural leiomyoma of uterus: Secondary | ICD-10-CM | POA: Insufficient documentation

## 2010-10-25 DIAGNOSIS — Z01812 Encounter for preprocedural laboratory examination: Secondary | ICD-10-CM | POA: Insufficient documentation

## 2010-10-25 DIAGNOSIS — E669 Obesity, unspecified: Secondary | ICD-10-CM

## 2010-10-25 LAB — HCG, SERUM, QUALITATIVE: Preg, Serum: NEGATIVE

## 2010-10-25 LAB — TYPE AND SCREEN

## 2010-10-25 LAB — ABO/RH: ABO/RH(D): O POS

## 2010-11-20 NOTE — Op Note (Signed)
Olivia Schmidt, Olivia Schmidt               ACCOUNT NO.:  [id number]  MEDICAL RECORD NO.:  [id number]           PATIENT TYPE:  LOCATION:                                 FACILITY:  PHYSICIAN:  Allie Bossier, MD        DATE OF BIRTH:  April 27, 1961  DATE OF PROCEDURE:  10/25/2010 DATE OF DISCHARGE:                              OPERATIVE REPORT   PREOPERATIVE DIAGNOSES: 1. Obesity. 2. Chronic pelvic pain. 3. Uterine fibroids. 4. Infertility.  POSTOPERATIVE DIAGNOSES: 1. Obesity. 2. Chronic pelvic pain. 3. Uterine fibroids. 4. Probable history of pelvic inflammatory disease, bilateral     hydrosalpinges.  PROCEDURES: 1. Robotic-assisted laparoscopic total hysterectomy. 2. Bilateral salpingo-oophorectomy. 3. Lysis of adhesions. 4. Cystoscopy.  SURGEON:  Allie Bossier, MD  ASSISTANT:  Horton Chin, MD  COMPLICATIONS:  None.  ESTIMATED BLOOD LOSS:  Less than 200 mL.  SPECIMENS:  Uterus, tubes, and ovaries.  FINDINGS: 1. Normal upper abdomen. 2. Ovaries were densely adherent to both side walls. 3. Dilated hydrosalpinges, bilateral. 4. Multiple fibroids including pedunculated and fundal intramural     fibroids.  DETAIL OF PROCEDURE AND FINDINGS:  The risks, benefits, and alternatives of surgery were explained, understood, and accepted.  Consents were signed.  She was taken to the operating room and placed in dorsal lithotomy position.  General anesthesia was applied without complication.  She was placed in Trendelenburg.  Her vagina and abdomen were prepped and draped in usual sterile fashion.  A Foley catheter was placed.  It drained clear urine throughout.  A weighted speculum was placed and a RUMI II uterine manipulator was placed.  Please note, the uterus sounded to 10 cm and a 4-cm cervical cuff was used based on the measurements of the cervix.  Please note, a bimanual exam revealed a generous suprapubic arch.  Gloves were changed.  Attention was turned to the abdomen.   A vertical umbilical incision was made after injecting approximately 3 mL of 0.5% Marcaine in the area to be cut.  A Veress needle was placed intraperitoneally.  Low-flow CO2 was used to insufflate the abdomen, approximately 4 L.  Once a good pneumoperitoneum was established, a 10-mm trocar was placed.  Laparoscopy confirmed correct placement.  The upper abdomen was inspected as was the remainder of the pelvis.  The above listed findings were noted.  The 8-mm ports were the 3 robotic arms were placed appropriately one to either side of the laparoscopic approximately 10 or 11 cm lateral to the laparoscope and approximately 2 cm below.  A third arm for the robot was placed in the left lower quadrant and an assistant port was placed on the right. Once the robot was docked and the robotic instruments were all placed in the appropriate position, I scrubbed out and went to the robotic console.  I began by visually inspecting the pelvis.  The above findings were noted.  Because the ovaries were so densely adherent, I felt it prudent to go ahead and do the hysterectomy to allow better visualization.  I cauterized the utero-ovarian ligaments, the oviducts, and then the round ligaments.  Excellent hemostasis was maintained.  I created a bladder flap anteriorly and made sure the uterine vessels bilaterally were skeletonized and cauterized.  I then made sure the bladder was well out of the operative site.  I entered the vaginal cuff, keeping the blue cervical ring inside.  I carried this around circumferentially and then the uterus was pulled into the vagina.  At this point, I was able to inspect all the pedicles once more and they were noted to be hemostatic.  At this point, I began the careful dissection of the ovaries free from the sidewall.  Please note that all of her tissue was indurated throughout the pelvis and much thicker than one would usually see.  This would certainly be consistent with  a longstanding inflammatory process.  I put the infundibulopelvic ligaments on stretch and I cauterized as close to the ovary as possible. Excellent hemostasis was noted.  At this point, I sutured the vagina with 4 interrupted z0 Vicryl sutures.  Excellent hemostasis was noted and cuff appeared to be well closed.  I then rescrubbed and undocked the robot and removed the ports.  The fascia at the 12 mm assistant port and at the umbilicus were closed with 0 Vicryl suture.  All of the incision sites were closed with a subcuticular closure of 4-0 Vicryl.  Steri- Strips were placed across each incision.  Excellent cosmetic results were obtained.  At this point, the patient was given indigo carmine and I visualized blue jets from both ureters.  In my attempt to manipulate the cystoscope, I caused some trauma to the uroepithelium and there was some blood in the catheter at the end of the case, this resolved.  She was taken to recovery room in stable condition.  The instrument, sponge, and needle counts were correct.  She tolerated the procedure well.     Allie Bossier, MD     MCD/MEDQ  D:  10/25/2010  T:  10/26/2010  Job:  [id number]  Electronically Signed by Nicholaus Bloom MD on 11/20/2010 09:45:23 AM

## 2010-11-22 ENCOUNTER — Ambulatory Visit: Payer: Self-pay | Admitting: Obstetrics & Gynecology

## 2010-11-22 ENCOUNTER — Inpatient Hospital Stay (HOSPITAL_COMMUNITY)
Admission: AD | Admit: 2010-11-22 | Discharge: 2010-11-23 | Disposition: A | Discharge status: Home / Self Care | Payer: Self-pay | Source: Ambulatory Visit | Attending: Obstetrics & Gynecology | Admitting: Obstetrics & Gynecology

## 2010-11-22 DIAGNOSIS — IMO0002 Reserved for concepts with insufficient information to code with codable children: Secondary | ICD-10-CM

## 2010-11-22 DIAGNOSIS — Z09 Encounter for follow-up examination after completed treatment for conditions other than malignant neoplasm: Secondary | ICD-10-CM

## 2010-11-22 DIAGNOSIS — Y838 Other surgical procedures as the cause of abnormal reaction of the patient, or of later complication, without mention of misadventure at the time of the procedure: Secondary | ICD-10-CM | POA: Insufficient documentation

## 2010-11-23 LAB — CBC
Hemoglobin: 10.6 g/dL — ABNORMAL LOW (ref 12.0–15.0)
MCHC: 32 g/dL (ref 30.0–36.0)
Platelets: 197 10*3/uL (ref 150–400)
RDW: 14.3 % (ref 11.5–15.5)

## 2010-11-23 NOTE — Group Therapy Note (Unsigned)
NAMEALVERTA, CACCAMO NO.:  [id number]  MEDICAL RECORD NO.:  [id number]           PATIENT TYPE:  O  LOCATION:  [facility]                    [facility]  PHYSICIAN:  Allie Bossier, MD        DATE OF BIRTH:  [birth year]  DATE OF SERVICE:  11/22/2010                                 CLINIC NOTE  Olivia Schmidt is a 50 year old lady who had a robotic assisted laparoscopic total hysterectomy, BSO and lysis of adhesions, and cystoscopy on Oct 25, 2010, she comes in today.  Her only complaint is that of some bloody mucus since surgery.  She has had no particular pain.  She has returned to work.  She has not had intercourse.  Her bowel and bladder function are normal.  On exam, her incisions were all well-healed.  Her vaginal cuff is well-healed.  She has some bloody mucus that one would expect with the dissolution of stitches.  I have recommended she return in 3 weeks or p.r.n. sooner and I have highly recommended that she not have intercourse until I see her in 3 weeks.     Allie Bossier, MD    MCD/MEDQ  D:  11/22/2010  T:  11/23/2010  Job:  [id number]

## 2010-11-24 NOTE — H&P (Signed)
  Olivia Schmidt, COSTABILE NO.:  [id number]  MEDICAL RECORD NO.:  [id number]  LOCATION:  [facility]                          FACILITY:  [facility]  PHYSICIAN:  Lazaro Arms, M.D.   DATE OF BIRTH:  [birth year]  DATE OF ADMISSION:  11/22/2010 DATE OF DISCHARGE:                             HISTORY & PHYSICAL   Olivia Schmidt is a 50 year old lady who is status post a robotic hysterectomy and BSO on May 16.  She had been doing terrific postop with no problems. She actually had her postop visit today, was seen by Dr. Marice Potter and had a speculum exam and everything appeared to be great.  No bleeding.  No problems at all.  However about 45 minutes after she had her exam, she started having a large amount of bright red vaginal bleeding.  She thought it may resolve and it did not.  She comes into the ER tonight, continued to have a large amount of passing clots and basically wearing a chuck with a large amount of blood.  She has minor pain nothing major. She did not have sex, did not put anything in her vagina.  On examination here in the MAU, she has bright red bleeding coming from the vaginal cuff.  I cannot certainly no vaginal dehiscence and I cannot specifically see the exact areas it is coming from, but it is coming from top of the cuff and it is fairly significant.  I held some pressure there.  It was unsuccessful and I have now packed her with Kerlix in the hopes of applying some pressure may indeed help and can avoid having her take her back to the OR to do vaginal suture placing.  The patient understands that and she will be admitted overnight for observation.  PAST MEDICAL HISTORY:  Significant for morbid obesity.  ALLERGIES:  She has no known drug allergies.  Her current medications are just as needed, pain meds that she went on postop.  PHYSICAL EXAMINATION:  HEENT:  Unremarkable. BREASTS:  Deferred. ABDOMEN:  Benign. PELVIC: As stated above, she has got blood on the external  vulva and on exam she has significant amount of blood coming from the vaginal apex cuff.  I do not see any sutures that had been disrupted by preciousness probably during the speculum exam, some tissue actually pulled through the sutures. EXTREMITIES:  Warmth with 2+ edema.  IMPRESSION:  Delayed postoperative vaginal bleeding probably secondary to tissue pulling through the sutures.  PLAN:  The patient has been packed and we will reevaluate in the morning for the possibility of placement of vaginal sutures if needed.  The patient understands and agrees and will stay overnight.     Lazaro Arms, M.D.     Loraine Maple  D:  11/23/2010  T:  11/23/2010  Job:  [id number]  Electronically Signed by Duane Lope M.D. on 11/24/2010 08:50:39 AM

## 2010-11-27 ENCOUNTER — Ambulatory Visit: Payer: Self-pay | Admitting: Obstetrics & Gynecology

## 2010-11-27 DIAGNOSIS — D259 Leiomyoma of uterus, unspecified: Secondary | ICD-10-CM

## 2010-11-28 NOTE — Group Therapy Note (Unsigned)
NAMEEARLY, ORD NO.:  [id number]  MEDICAL RECORD NO.:  [id number]           PATIENT TYPE:  A  LOCATION:  [facility]                   FACILITY:  [facility]  PHYSICIAN:  Scheryl Darter, MD       DATE OF BIRTH:  26-Feb-1961  DATE OF SERVICE:  11/27/2010                                 CLINIC NOTE  The patient returns today after she had episode of vaginal bleeding and she went to the emergency room on November 22, 2010.  The patient is status post robotic-assisted laparoscopic total hysterectomy, BSO, and lysis of adhesions and cystoscopy.  She saw Dr. Marice Potter last week.  She had noted some vaginal bleeding and she went to the MAU.  During that time, her vaginal bleeding stopped and she was discharged.  She has no bleeding now.  She asked if I could defer pelvic exam today and wait for about 2 weeks.  She will return to see Dr. Marice Potter.  She should notify us if her symptoms return.  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     Scheryl Darter, MD    JA/MEDQ  D:  11/27/2010  T:  11/28/2010  Job:  [id number]

## 2010-12-25 ENCOUNTER — Ambulatory Visit: Payer: Self-pay | Admitting: Obstetrics & Gynecology

## 2011-01-24 ENCOUNTER — Ambulatory Visit (INDEPENDENT_AMBULATORY_CARE_PROVIDER_SITE_OTHER): Payer: Self-pay | Admitting: Family Medicine

## 2011-01-24 ENCOUNTER — Encounter: Payer: Self-pay | Admitting: Family Medicine

## 2011-01-24 VITALS — BP 146/92 | HR 72 | Temp 97.3°F | Resp 20 | Ht 64.0 in | Wt 263.8 lb

## 2011-01-24 DIAGNOSIS — Z90710 Acquired absence of both cervix and uterus: Secondary | ICD-10-CM

## 2011-01-24 NOTE — Progress Notes (Deleted)
Subjective:     Patient ID: Olivia Schmidt, female   DOB: November 14, 1960, 50 y.o.   MRN: [id number]  HPI   Review of Systems     Objective:   Physical Exam     Assessment:     ***    Plan:     ***

## 2011-01-24 NOTE — Patient Instructions (Signed)
Work with your family doctor to further assess reasons for pain. We will call you regarding your lab results.

## 2011-01-24 NOTE — Progress Notes (Signed)
Pt requests refill of HCTZ

## 2011-01-24 NOTE — Progress Notes (Signed)
Subjective:     Olivia Schmidt Minor is a 50 y.o. female who presents to the clinic for follow up, status post total abdominal hysterectomy for abnormal uterine bleeding and fibroids. Eating a regular diet without difficulty. Bowel movements are normal. Pain is not well controlled.  Medications being used: ibuprofen (OTC).  Patient complains of intermittent pain, that is episodic.  She has pain in her legs about the time she expects her periods to start.  It is a deep ache, and makes it difficult for her to work.  She also complains of increased vaginal discharge since surgery.   The following portions of the patient's history were reviewed and updated as appropriate: allergies, current medications, past family history, past medical history, past social history, past surgical history and problem list.  Review of Systems Pertinent items are noted in HPI.    Objective:    BP 146/92  Pulse 72  Temp(Src) 97.3 F (36.3 C) (Oral)  Resp 20  Ht 5\' 4"  (1.626 m)  Wt 263 lb 12.8 oz (119.659 kg)  BMI 45.28 kg/m2  LMP 09/14/2010 General:  alert, cooperative and no distress  Abdomen: soft, bowel sounds active, non-tender  Incision:   healing well, no drainage, no erythema, no hernia, no seroma, no swelling, no dehiscence, incision well approximated    Pelvic exam: Mild tenderness with exam, vagina appears normal, no lesions, vaginal cuff well healed.  Thin white discharge.  Assessment:    Doing well postoperatively. Operative findings again reviewed.   Plan:    1. Continue any current medications. 2. Advised starting Vitamin D and Calcium supplements in setting of bone pain and surgical menopause.  Will check Vitamin D level, advised pt follow up with PCP for leg pain.  3. Activity restrictions: none 4. Anticipated return to work: now. 5. Follow up as needed.  Ardyth Gal, MD Family Practice Resident, PGY2  __________________________ Pt seen and examined with Dr Lula Olszewski. Agree with  above.  Lucina Mellow, DO Fellow

## 2011-01-27 LAB — VITAMIN D 1,25 DIHYDROXY
Vitamin D 1, 25 (OH)2 Total: 81 pg/mL — ABNORMAL HIGH (ref 18–72)
Vitamin D3 1, 25 (OH)2: 81 pg/mL

## 2011-01-30 ENCOUNTER — Telehealth (HOSPITAL_COMMUNITY): Payer: Self-pay

## 2011-01-30 NOTE — Telephone Encounter (Signed)
Called pt and informed pt of vit D deficiency and informed her to cont the regimen of vit d and calcium that Dr. Natale Milch recommended to her and to follow up with her PCP in 4-69months.  Pt stated understanding.

## 2011-01-30 NOTE — Telephone Encounter (Signed)
Message copied by Faythe Casa on Tue Jan 30, 2011 11:01 AM ------      Message from: Delena Bali      Created: Tue Jan 30, 2011 10:19 AM       Please notify patient that she did have a vitamin d deficiency and to cont to take the vitamin d and calcium as I prescribed/recommended; she will need to follow this with her primary care provider (repeat testing in 4-6 months would be appropriate). Thanks!

## 2011-03-08 LAB — COMPREHENSIVE METABOLIC PANEL
ALT: 15
AST: 17
CO2: 27
Calcium: 9
Creatinine, Ser: 1.01
GFR calc Af Amer: >60
GFR calc non Af Amer: 60 — ABNORMAL LOW
Sodium: 139
Total Protein: 6.7

## 2011-03-08 LAB — URINALYSIS, ROUTINE W REFLEX MICROSCOPIC
Nitrite: NEGATIVE
Specific Gravity, Urine: 1.034 — ABNORMAL HIGH
Urobilinogen, UA: 0.2
pH: 6

## 2011-03-08 LAB — DIFFERENTIAL
Eosinophils Relative: 2
Lymphocytes Relative: 29
Lymphs Abs: 2.1
Monocytes Relative: 8
Neutrophils Relative %: 60

## 2011-03-08 LAB — URINE CULTURE: Culture: NO GROWTH

## 2011-03-08 LAB — URINE MICROSCOPIC-ADD ON

## 2011-03-08 LAB — PREGNANCY, URINE: Preg Test, Ur: NEGATIVE

## 2011-03-08 LAB — CBC
MCHC: 33.2
MCV: 86
RBC: 4.09
RDW: 14.6

## 2011-03-08 LAB — LIPASE, BLOOD: Lipase: 11

## 2011-03-23 LAB — WOUND CULTURE: Gram Stain: NONE SEEN

## 2012-08-19 ENCOUNTER — Encounter (HOSPITAL_COMMUNITY): Payer: Self-pay | Admitting: Emergency Medicine

## 2012-08-19 ENCOUNTER — Emergency Department (HOSPITAL_COMMUNITY): Payer: Self-pay

## 2012-08-19 ENCOUNTER — Emergency Department (HOSPITAL_COMMUNITY)
Admission: EM | Admit: 2012-08-19 | Discharge: 2012-08-19 | Disposition: A | Discharge status: Home / Self Care | Payer: Self-pay | Attending: Emergency Medicine | Admitting: Emergency Medicine

## 2012-08-19 DIAGNOSIS — Z79899 Other long term (current) drug therapy: Secondary | ICD-10-CM | POA: Insufficient documentation

## 2012-08-19 DIAGNOSIS — R0602 Shortness of breath: Secondary | ICD-10-CM | POA: Insufficient documentation

## 2012-08-19 DIAGNOSIS — Z8742 Personal history of other diseases of the female genital tract: Secondary | ICD-10-CM | POA: Insufficient documentation

## 2012-08-19 DIAGNOSIS — R110 Nausea: Secondary | ICD-10-CM | POA: Insufficient documentation

## 2012-08-19 DIAGNOSIS — R42 Dizziness and giddiness: Secondary | ICD-10-CM | POA: Insufficient documentation

## 2012-08-19 DIAGNOSIS — Z87891 Personal history of nicotine dependence: Secondary | ICD-10-CM | POA: Insufficient documentation

## 2012-08-19 LAB — URINALYSIS, ROUTINE W REFLEX MICROSCOPIC
Glucose, UA: NEGATIVE mg/dL
Hgb urine dipstick: NEGATIVE
Ketones, ur: NEGATIVE mg/dL
Leukocytes, UA: NEGATIVE
Protein, ur: NEGATIVE mg/dL

## 2012-08-19 LAB — CBC WITH DIFFERENTIAL/PLATELET
Basophils Absolute: 0.1 10*3/uL (ref 0.0–0.1)
Eosinophils Absolute: 0.1 10*3/uL (ref 0.0–0.7)
Eosinophils Relative: 2 % (ref 0–5)
Lymphs Abs: 1.5 10*3/uL (ref 0.7–4.0)
MCH: 27.3 pg (ref 26.0–34.0)
MCHC: 32.2 g/dL (ref 30.0–36.0)
MCV: 84.9 fL (ref 78.0–100.0)
Monocytes Absolute: 0.4 10*3/uL (ref 0.1–1.0)
Platelets: 178 10*3/uL (ref 150–400)
RDW: 14.6 % (ref 11.5–15.5)

## 2012-08-19 LAB — BASIC METABOLIC PANEL
CO2: 24 mEq/L (ref 19–32)
Calcium: 8.9 mg/dL (ref 8.4–10.5)
Creatinine, Ser: 0.75 mg/dL (ref 0.50–1.10)
GFR calc non Af Amer: >90 mL/min (ref >90)

## 2012-08-19 MED ORDER — MECLIZINE HCL 25 MG PO TABS
25.0000 mg | ORAL_TABLET | Freq: Once | ORAL | Status: AC
  Administered 2012-08-19: 25 mg via ORAL
  Filled 2012-08-19: qty 1

## 2012-08-19 MED ORDER — MECLIZINE HCL 25 MG PO TABS: 25.0000 mg | ORAL_TABLET | Freq: Three times a day (TID) | ORAL | Status: DC | PRN

## 2012-08-19 MED ORDER — SODIUM CHLORIDE 0.9 % IV BOLUS (SEPSIS)
500.0000 mL | Freq: Once | INTRAVENOUS | Status: AC
  Administered 2012-08-19: 500 mL via INTRAVENOUS

## 2012-08-19 MED ORDER — DIAZEPAM 5 MG/ML IJ SOLN
5.0000 mg | Freq: Once | INTRAMUSCULAR | Status: AC
  Administered 2012-08-19: 5 mg via INTRAVENOUS
  Filled 2012-08-19: qty 2

## 2012-08-19 NOTE — ED Notes (Signed)
Per patient, states woke up this am with room spinning/SOB-states she doesn't feel right

## 2012-08-19 NOTE — ED Notes (Signed)
[id number]<AV> Expected date:<BR> Expected time:<BR> Means of arrival:<BR> Comments:<BR> For triage 2

## 2012-08-19 NOTE — ED Provider Notes (Signed)
History     CSN: [id number]  Arrival date & time 08/19/12  6213   First MD Initiated Contact with Patient 08/19/12 [phone]      Chief Complaint  Patient presents with  . Dizziness  . Shortness of Breath    (Consider location/radiation/quality/duration/timing/severity/associated sxs/prior treatment) HPI Comments: Patient comes to the ER for evaluation of dizziness. Patient reports that she woke this morning feeling like the room is spinning. It has caused nausea. She continues to have symptoms here in the ER. Symptoms aren't worsened by moving her head, she feels better if she lies still with her eyes closed. She has not had any headache. There is no fever. Patient appears very anxious.  Patient is a 52 y.o. female presenting with shortness of breath.  Shortness of Breath Associated symptoms: no headaches     Past Medical History  Diagnosis Date  . Fibroids     Past Surgical History  Procedure Laterality Date  . Abdominal hysterectomy      Family History  Problem Relation Age of Onset  . Aneurysm Father     brain  . Hypertension Sister   . Hypertension Brother     History  Substance Use Topics  . Smoking status: Former Smoker -- 24 years    Quit date: 06/12/2003  . Smokeless tobacco: Not on file  . Alcohol Use: No    OB History   Grav Para Term Preterm Abortions TAB SAB Ect Mult Living   1    1           Review of Systems  Respiratory: Positive for shortness of breath.   Neurological: Positive for dizziness. Negative for headaches.  All other systems reviewed and are negative.    Allergies  Review of patient's allergies indicates no known allergies.  Home Medications   Current Outpatient Rx  Name  Route  Sig  Dispense  Refill  . hydrochlorothiazide 25 MG tablet   Oral   Take 25 mg by mouth daily.           Marland Kitchen ibuprofen (ADVIL,MOTRIN) 800 MG tablet   Oral   Take 800 mg by mouth at bedtime.             BP 171/85  Pulse 57  Temp(Src) 97.6  F (36.4 C) (Oral)  Resp 20  SpO2 100%  LMP 09/14/2010  Physical Exam  Constitutional: She is oriented to person, place, and time. She appears well-developed and well-nourished. No distress.  HENT:  Head: Normocephalic and atraumatic.  Right Ear: Hearing normal.  Nose: Nose normal.  Mouth/Throat: Oropharynx is clear and moist and mucous membranes are normal.  Eyes: Conjunctivae and EOM are normal. Pupils are equal, round, and reactive to light.  Neck: Normal range of motion. Neck supple.  Cardiovascular: Normal rate, regular rhythm, S1 normal and S2 normal.  Exam reveals no gallop and no friction rub.   No murmur heard. Pulmonary/Chest: Effort normal and breath sounds normal. No respiratory distress. She exhibits no tenderness.  Abdominal: Soft. Normal appearance and bowel sounds are normal. There is no hepatosplenomegaly. There is no tenderness. There is no rebound, no guarding, no tenderness at McBurney's point and negative Murphy's sign. No hernia.  Musculoskeletal: Normal range of motion.  Neurological: She is alert and oriented to person, place, and time. She has normal strength. No cranial nerve deficit or sensory deficit. Coordination normal. GCS eye subscore is 4. GCS verbal subscore is 5. GCS motor subscore is 6.  Skin: Skin  is warm, dry and intact. No rash noted. No cyanosis.  Psychiatric: She has a normal mood and affect. Her speech is normal and behavior is normal. Thought content normal.    ED Course  Procedures (including critical care time)    Date: 08/19/2012  Rate: 58  Rhythm: normal sinus rhythm  QRS Axis: normal  Intervals: normal  ST/T Wave abnormalities: normal  Conduction Disutrbances: none  Narrative Interpretation: unremarkable    Labs Reviewed  CBC WITH DIFFERENTIAL - Abnormal; Notable for the following:    Hemoglobin 11.9 (*)    All other components within normal limits  BASIC METABOLIC PANEL - Abnormal; Notable for the following:    Glucose, Bld  100 (*)    All other components within normal limits  URINALYSIS, ROUTINE W REFLEX MICROSCOPIC   Dg Chest 2 View  08/19/2012  *RADIOLOGY REPORT*  Clinical Data: Short of breath, generalized weakness  CHEST - 2 VIEW  Comparison: Chest radiograph 11/30/2006  Findings: Normal mediastinum and heart silhouette.  The are low lung volumes.  No effusion, infiltrate, or pneumothorax.  IMPRESSION: Low lung volumes.  No acute cardiopulmonary findings.   Original Report Authenticated By: Genevive Bi, M.D.    Ct Head Wo Contrast  08/19/2012  *RADIOLOGY REPORT*  Clinical Data: Dizziness.  Nausea and vomiting  CT HEAD WITHOUT CONTRAST  Technique:  Contiguous axial images were obtained from the base of the skull through the vertex without contrast.  Comparison: None  Findings: Mild patchy low attenuation is identified within the subcortical white matter.  The brain has an otherwise appearance without evidence for hemorrhage, infarction, hydrocephalus, or mass lesion.  There is no extra axial fluid collection.  The skull and paranasal sinuses are normal.  IMPRESSION:  1.  Nonspecific white matter abnormality likely reflecting mild small vessel ischemic change. This could be better assessed with MRI of the brain.  2.  No acute intracranial abnormalities   Original Report Authenticated By: Signa Kell, M.D.      Diagnosis: Vertigo    MDM  Patient presents to the ER for evaluation of severe dizziness which began this morning. Patient indicates that the room is spinning and she has worsening with moving her head and eyes. Symptoms are consistent with peripheral vertigo. The remainder of the neurologic exam was unremarkable. CT head was unremarkable. Patient has improved with IV fluids and meclizine. We'll continue outpatient treatment of her peripheral vertigo.  Patient did indicate that she has had shortness of breath earlier. This started after the dizziness. Patient was very anxious at arrival suspect there  is a component of this. Chest x-ray did not show any acute findings. Her oxygenation is 100% on room air. Patient did not have any suspicion for PE based on her presentation (PERC negative).        Gilda Crease, MD 08/19/12 1324

## 2013-07-04 ENCOUNTER — Encounter (HOSPITAL_COMMUNITY): Payer: Self-pay | Admitting: Emergency Medicine

## 2013-07-04 ENCOUNTER — Emergency Department (HOSPITAL_COMMUNITY)
Admission: EM | Admit: 2013-07-04 | Discharge: 2013-07-04 | Discharge status: Against Medical Advice | Payer: Self-pay | Attending: Emergency Medicine | Admitting: Emergency Medicine

## 2013-07-04 DIAGNOSIS — R079 Chest pain, unspecified: Secondary | ICD-10-CM | POA: Insufficient documentation

## 2013-07-04 DIAGNOSIS — J029 Acute pharyngitis, unspecified: Secondary | ICD-10-CM | POA: Insufficient documentation

## 2013-07-04 LAB — BASIC METABOLIC PANEL
BUN: 11 mg/dL (ref 6–23)
CALCIUM: 9.1 mg/dL (ref 8.4–10.5)
CHLORIDE: 102 meq/L (ref 96–112)
CO2: 28 meq/L (ref 19–32)
Creatinine, Ser: 0.81 mg/dL (ref 0.50–1.10)
GFR calc Af Amer: >90 mL/min (ref >90)
GFR calc non Af Amer: 83 mL/min — ABNORMAL LOW (ref >90)
GLUCOSE: 92 mg/dL (ref 70–99)
POTASSIUM: 4.7 meq/L (ref 3.7–5.3)
SODIUM: 139 meq/L (ref 137–147)

## 2013-07-04 LAB — POCT I-STAT TROPONIN I: Troponin i, poc: 0 ng/mL (ref 0.00–0.08)

## 2013-07-04 LAB — CBC
HCT: 38.2 % (ref 36.0–46.0)
HEMOGLOBIN: 12.5 g/dL (ref 12.0–15.0)
MCH: 28.2 pg (ref 26.0–34.0)
MCHC: 32.7 g/dL (ref 30.0–36.0)
MCV: 86.2 fL (ref 78.0–100.0)
PLATELETS: 179 10*3/uL (ref 150–400)
RBC: 4.43 MIL/uL (ref 3.87–5.11)
RDW: 14.7 % (ref 11.5–15.5)
WBC: 8.2 10*3/uL (ref 4.0–10.5)

## 2013-07-04 NOTE — ED Notes (Signed)
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

## 2013-07-04 NOTE — ED Notes (Signed)
Pt c/o CP and sore throat x 5 days.  States pain is over right chest and is sharp.

## 2013-07-04 NOTE — ED Notes (Signed)
Pt called x3, no repsonse

## 2013-07-06 ENCOUNTER — Emergency Department (HOSPITAL_COMMUNITY): Payer: Self-pay

## 2013-07-06 ENCOUNTER — Emergency Department (HOSPITAL_COMMUNITY)
Admission: EM | Admit: 2013-07-06 | Discharge: 2013-07-07 | Disposition: A | Discharge status: Home / Self Care | Payer: Self-pay | Attending: Emergency Medicine | Admitting: Emergency Medicine

## 2013-07-06 ENCOUNTER — Encounter (HOSPITAL_COMMUNITY): Payer: Self-pay | Admitting: Emergency Medicine

## 2013-07-06 DIAGNOSIS — Z7982 Long term (current) use of aspirin: Secondary | ICD-10-CM | POA: Insufficient documentation

## 2013-07-06 DIAGNOSIS — Z8742 Personal history of other diseases of the female genital tract: Secondary | ICD-10-CM | POA: Insufficient documentation

## 2013-07-06 DIAGNOSIS — Z87891 Personal history of nicotine dependence: Secondary | ICD-10-CM | POA: Insufficient documentation

## 2013-07-06 DIAGNOSIS — R197 Diarrhea, unspecified: Secondary | ICD-10-CM | POA: Insufficient documentation

## 2013-07-06 DIAGNOSIS — R071 Chest pain on breathing: Secondary | ICD-10-CM | POA: Insufficient documentation

## 2013-07-06 DIAGNOSIS — R0789 Other chest pain: Secondary | ICD-10-CM

## 2013-07-06 DIAGNOSIS — J069 Acute upper respiratory infection, unspecified: Secondary | ICD-10-CM | POA: Insufficient documentation

## 2013-07-06 LAB — POCT I-STAT TROPONIN I: Troponin i, poc: 0 ng/mL (ref 0.00–0.08)

## 2013-07-06 LAB — CBC
HCT: 36.7 % (ref 36.0–46.0)
HEMOGLOBIN: 11.6 g/dL — AB (ref 12.0–15.0)
MCH: 27 pg (ref 26.0–34.0)
MCHC: 31.6 g/dL (ref 30.0–36.0)
MCV: 85.5 fL (ref 78.0–100.0)
PLATELETS: 190 10*3/uL (ref 150–400)
RBC: 4.29 MIL/uL (ref 3.87–5.11)
RDW: 14.7 % (ref 11.5–15.5)
WBC: 7.3 10*3/uL (ref 4.0–10.5)

## 2013-07-06 LAB — BASIC METABOLIC PANEL
BUN: 14 mg/dL (ref 6–23)
CALCIUM: 9.3 mg/dL (ref 8.4–10.5)
CO2: 28 mEq/L (ref 19–32)
Chloride: 101 mEq/L (ref 96–112)
Creatinine, Ser: 0.79 mg/dL (ref 0.50–1.10)
GFR calc non Af Amer: >90 mL/min (ref >90)
GLUCOSE: 106 mg/dL — AB (ref 70–99)
Potassium: 4.5 mEq/L (ref 3.7–5.3)
Sodium: 140 mEq/L (ref 137–147)

## 2013-07-06 LAB — D-DIMER, QUANTITATIVE: D-Dimer, Quant: 1.26 ug/mL-FEU — ABNORMAL HIGH (ref 0.00–0.48)

## 2013-07-06 MED ORDER — IBUPROFEN 800 MG PO TABS
800.0000 mg | ORAL_TABLET | Freq: Once | ORAL | Status: AC
  Administered 2013-07-06: 800 mg via ORAL
  Filled 2013-07-06: qty 1

## 2013-07-06 MED ORDER — IOHEXOL 350 MG/ML SOLN
100.0000 mL | Freq: Once | INTRAVENOUS | Status: AC | PRN
  Administered 2013-07-06: 100 mL via INTRAVENOUS

## 2013-07-06 NOTE — Progress Notes (Signed)
Patient confirms she does not have a pcp or insurance. Patient reports she has applied for the orange card and has an appointment with [facility] on Feb 18th. [facility] provided patient with information regarding [place] and Medicaid, fiancial resources in the community such as local [employer] and [employer], [employer], discounted pharmacies, and dental assistance for patients without insurance.  Patient thankful for resources.

## 2013-07-06 NOTE — ED Provider Notes (Signed)
CSN: [id number]     Arrival date & time 07/06/13  1548 History   First MD Initiated Contact with Patient 07/06/13 1938     Chief Complaint  Patient presents with  . Chest Pain  . Sore Throat   (Consider location/radiation/quality/duration/timing/severity/associated sxs/prior Treatment) HPI Comments: 53 year old female presents with 6 days of intermittent chest pain. She states is sharp pain. There is no reason he comes or goes. The patient is not worse with exertion. Is not pleuritic. 2 days ago she started having a cough and sore throat. She states it hurts to swallow. She denies any fevers. Denies a shortness of breath, diaphoresis, nausea or vomiting. No abdominal pain. There are no sick contacts. She has been having muscle aches in the last couple days as well. She states now her cough has a little but of phlegm but no hemoptysis. No leg swelling or history of pulmonary embolism or DVT. She rates her pain as a 6/10 and states his been continuous over the past 12 hours.   Past Medical History  Diagnosis Date  . Fibroids    Past Surgical History  Procedure Laterality Date  . Abdominal hysterectomy     Family History  Problem Relation Age of Onset  . Aneurysm Father     brain  . Hypertension Sister   . Hypertension Brother    History  Substance Use Topics  . Smoking status: Former Smoker -- 24 years    Quit date: 06/12/2003  . Smokeless tobacco: Not on file  . Alcohol Use: No   OB History   Grav Para Term Preterm Abortions TAB SAB Ect Mult Living   1    1          Review of Systems  Constitutional: Negative for fever.  HENT: Positive for sore throat.   Respiratory: Positive for cough. Negative for shortness of breath.   Cardiovascular: Positive for chest pain.  Gastrointestinal: Positive for diarrhea. Negative for nausea, vomiting and abdominal pain.  Musculoskeletal: Negative for back pain.  Skin: Negative for rash.  Neurological: Negative for weakness.  All other  systems reviewed and are negative.    Allergies  Review of patient's allergies indicates no known allergies.  Home Medications   Current Outpatient Rx  Name  Route  Sig  Dispense  Refill  . aspirin 81 MG tablet   Oral   Take 81 mg by mouth daily. As needed for chest pain          BP 151/69  Pulse 72  Temp(Src) 98 F (36.7 C) (Oral)  Resp 18  SpO2 100%  LMP 09/14/2010 Physical Exam  Nursing note and vitals reviewed. Constitutional: She is oriented to person, place, and time. She appears well-developed and well-nourished. No distress.  HENT:  Head: Normocephalic and atraumatic.  Right Ear: External ear normal.  Left Ear: External ear normal.  Nose: Nose normal.  Mouth/Throat: Oropharynx is clear and moist. No oropharyngeal exudate.  Mild erythema to posterior oropharynx but no swelling or exudate  Eyes: Right eye exhibits no discharge. Left eye exhibits no discharge.  Neck: Neck supple.  Cardiovascular: Normal rate, regular rhythm and normal heart sounds.   Pulmonary/Chest: Effort normal and breath sounds normal. She exhibits tenderness (Left anterior chest).  Abdominal: Soft. She exhibits no distension. There is no tenderness.  Lymphadenopathy:    She has no cervical adenopathy.  Neurological: She is alert and oriented to person, place, and time.  Skin: Skin is warm and dry.  ED Course  Procedures (including critical care time) Labs Review Labs Reviewed  CBC - Abnormal; Notable for the following:    Hemoglobin 11.6 (*)    All other components within normal limits  BASIC METABOLIC PANEL - Abnormal; Notable for the following:    Glucose, Bld 106 (*)    All other components within normal limits  D-DIMER, QUANTITATIVE - Abnormal; Notable for the following:    D-Dimer, Quant 1.26 (*)    All other components within normal limits  POCT I-STAT TROPONIN I   Imaging Review Dg Chest 2 View  07/06/2013   CLINICAL DATA:  Chest pain.  EXAM: CHEST - 2 VIEW   COMPARISON:  08/19/2012  FINDINGS: The heart size and mediastinal contours are within normal limits. There is no evidence of pulmonary edema, consolidation, pneumothorax, nodule or pleural fluid. The visualized skeletal structures are unremarkable.  IMPRESSION: No active disease.   Electronically Signed   By: Aletta Edouard M.D.   On: 07/06/2013 20:20   Ct Angio Chest Pe W/cm &/or Wo Cm  07/07/2013   CLINICAL DATA:  Chest pain, sore throat, productive cough. Diarrhea. Assess for pulmonary embolism.  EXAM: CT ANGIOGRAPHY CHEST WITH CONTRAST  TECHNIQUE: Multidetector CT imaging of the chest was performed using the standard protocol during bolus administration of intravenous contrast. Multiplanar CT image reconstructions including MIPs were obtained to evaluate the vascular anatomy.  CONTRAST:  131mL OMNIPAQUE IOHEXOL 350 MG/ML SOLN  COMPARISON:  Chest radiograph July 06, 2013 at 2007 hr  FINDINGS: Suboptimal bolus timing may limit sensitivity for subsegmental pulmonary emboli. The main pulmonary artery is not enlarged. No pulmonary arterial filling defects to at least the segmental branches.  Heart is upper limits of normal in size. Pericardium is nonsuspicious. Two vessel aortic arch, thoracic aorta is normal.  Tracheobronchial tree is widely patent, no pneumothorax. Trace bronchial wall thickening. No pleural effusions or focal consolidations. No pulmonary nodules or masses.  No lymphadenopathy by CT size criteria. Thoracic esophagus is unremarkable. Included view of the abdomen is normal. 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  Review of the MIP images confirms the above findings.  IMPRESSION: No pulmonary embolism though, a suboptimal bolus timing may decrease sensitivity for subsegmental pulmonary artery filling defects.  Trace bronchial wall thickening may reflect bronchitis without pneumonia.   Electronically Signed   By: Elon Alas    On: 07/07/2013 00:04    EKG Interpretation    Date/Time:  Monday July 06 2013 16:08:09 EST Ventricular Rate:  82 PR Interval:  169 QRS Duration: 68 QT Interval:  370 QTC Calculation: 432 R Axis:   22 Text Interpretation:  Sinus rhythm Probable left atrial enlargement Baseline wander in lead(s) V1 No significant change since last tracing Confirmed by Destyni Hoppel  MD, Dagoberto Nealy ([id number]) on 07/06/2013 7:46:35 PM            MDM   1. Upper respiratory infection   2. Chest wall pain    Patient with atypical CP, now in the setting of URI symptoms. Normal voice, no neck swelling or trismus to suggest more serious neck infection. Given the CP started first, ddimer checked to r/o low risk PE. Ddimer elevated, CT obtained. Very atypical, unlikely to be ACS with normal EKG, low risk HEART score and prolonged symptoms.     Ephraim Hamburger, MD 07/07/13 [phone]

## 2013-07-06 NOTE — ED Notes (Signed)
Pt reports chest pain for 6 days and sore throat with cough that is productive. Denies n/v. Reports positive for diarrhea.

## 2013-07-07 NOTE — Discharge Instructions (Signed)
Chest Pain (Nonspecific) °It is often hard to give a specific diagnosis for the cause of chest pain. There is always a chance that your pain could be related to something serious, such as a heart attack or a blood clot in the lungs. You need to follow up with your caregiver for further evaluation. °CAUSES  °· Heartburn. °· Pneumonia or bronchitis. °· Anxiety or stress. °· Inflammation around your heart (pericarditis) or lung (pleuritis or pleurisy). °· A blood clot in the lung. °· A collapsed lung (pneumothorax). It can develop suddenly on its own (spontaneous pneumothorax) or from injury (trauma) to the chest. °· Shingles infection (herpes zoster virus). °The chest wall is composed of bones, muscles, and cartilage. Any of these can be the source of the pain. °· The bones can be bruised by injury. °· The muscles or cartilage can be strained by coughing or overwork. °· The cartilage can be affected by inflammation and become sore (costochondritis). °DIAGNOSIS  °Lab tests or other studies, such as X-rays, electrocardiography, stress testing, or cardiac imaging, may be needed to find the cause of your pain.  °TREATMENT  °· Treatment depends on what may be causing your chest pain. Treatment may include: °· Acid blockers for heartburn. °· Anti-inflammatory medicine. °· Pain medicine for inflammatory conditions. °· Antibiotics if an infection is present. °· You may be advised to change lifestyle habits. This includes stopping smoking and avoiding alcohol, caffeine, and chocolate. °· You may be advised to keep your head raised (elevated) when sleeping. This reduces the chance of acid going backward from your stomach into your esophagus. °· Most of the time, nonspecific chest pain will improve within 2 to 3 days with rest and mild pain medicine. °HOME CARE INSTRUCTIONS  °· If antibiotics were prescribed, take your antibiotics as directed. Finish them even if you start to feel better. °· For the next few days, avoid physical  activities that bring on chest pain. Continue physical activities as directed. °· Do not smoke. °· Avoid drinking alcohol. °· Only take over-the-counter or prescription medicine for pain, discomfort, or fever as directed by your caregiver. °· Follow your caregiver's suggestions for further testing if your chest pain does not go away. °· Keep any follow-up appointments you made. If you do not go to an appointment, you could develop lasting (chronic) problems with pain. If there is any problem keeping an appointment, you must call to reschedule. °SEEK MEDICAL CARE IF:  °· You think you are having problems from the medicine you are taking. Read your medicine instructions carefully. °· Your chest pain does not go away, even after treatment. °· You develop a rash with blisters on your chest. °SEEK IMMEDIATE MEDICAL CARE IF:  °· You have increased chest pain or pain that spreads to your arm, neck, jaw, back, or abdomen. °· You develop shortness of breath, an increasing cough, or you are coughing up blood. °· You have severe back or abdominal pain, feel nauseous, or vomit. °· You develop severe weakness, fainting, or chills. °· You have a fever. °THIS IS AN EMERGENCY. Do not wait to see if the pain will go away. Get medical help at once. Call your local emergency services (911 in [place] Do not drive yourself to the hospital. °MAKE SURE YOU:  °· Understand these instructions. °· Will watch your condition. °· Will get help right away if you are not doing well or get worse. °Document Released: 03/07/2005 Document Revised: 08/20/2011 Document Reviewed: 01/01/2008 °[employer]® Patient Information ©2014 [employer]   [employer]. ° °Chest Wall Pain °Chest wall pain is pain in or around the bones and muscles of your chest. It may take up to 6 weeks to get better. It may take longer if you must stay physically active in your work and activities.  °CAUSES  °Chest wall pain may happen on its own. However, it may be caused by: °· A viral illness  like the flu. °· Injury. °· Coughing. °· Exercise. °· Arthritis. °· Fibromyalgia. °· Shingles. °HOME CARE INSTRUCTIONS  °· Avoid overtiring physical activity. Try not to strain or perform activities that cause pain. This includes any activities using your chest or your abdominal and side muscles, especially if heavy weights are used. °· Put ice on the sore area. °· Put ice in a plastic bag. °· Place a towel between your skin and the bag. °· Leave the ice on for 15-20 minutes per hour while awake for the first 2 days. °· Only take over-the-counter or prescription medicines for pain, discomfort, or fever as directed by your caregiver. °SEEK IMMEDIATE MEDICAL CARE IF:  °· Your pain increases, or you are very uncomfortable. °· You have a fever. °· Your chest pain becomes worse. °· You have new, unexplained symptoms. °· You have nausea or vomiting. °· You feel sweaty or lightheaded. °· You have a cough with phlegm (sputum), or you cough up blood. °MAKE SURE YOU:  °· Understand these instructions. °· Will watch your condition. °· Will get help right away if you are not doing well or get worse. °Document Released: 05/28/2005 Document Revised: 08/20/2011 Document Reviewed: 01/22/2011 °Exit[employer]® Patient Information ©2014 [employer]. ° °

## 2013-07-31 ENCOUNTER — Ambulatory Visit: Payer: Self-pay

## 2014-03-06 IMAGING — CT CT ANGIO CHEST
1 of 2 series · 19 of 32 positions shown · IV contrast (OMNIPAQUE 300)
Comparison: Chest radiograph July 06, 2013 at 5224 hr

CLINICAL DATA: Chest pain, sore throat, productive cough. Diarrhea.
Assess for pulmonary embolism.

EXAM:
CT ANGIOGRAPHY CHEST WITH CONTRAST
TECHNIQUE: Multidetector CT imaging of the chest was performed using the
standard protocol during bolus administration of intravenous
contrast. Multiplanar CT image reconstructions including MIPs were
obtained to evaluate the vascular anatomy.
CONTRAST:  100mL OMNIPAQUE IOHEXOL 350 MG/ML SOLN

[Series 10: thins for pacs · axial · 0.74mm/px · z∈[-329,-135]mm · 19 of 216 slices shown]
[im 11/216  lung]
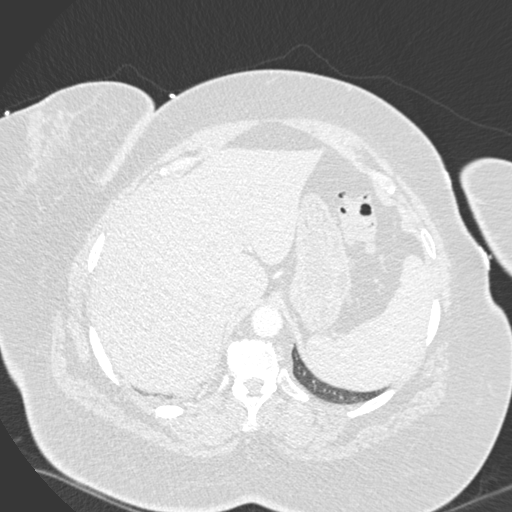
[im 22/216  mediastinal]
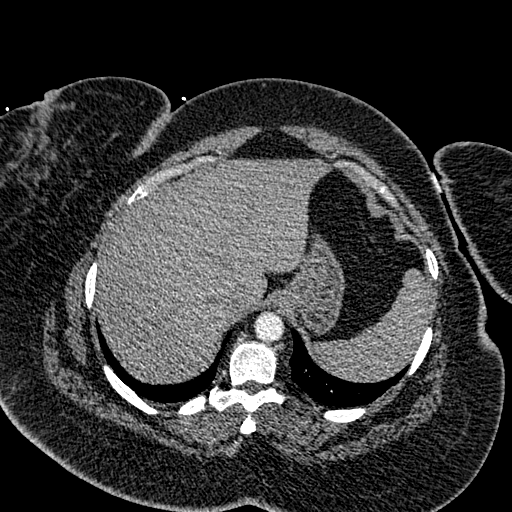
[im 33/216  lung]
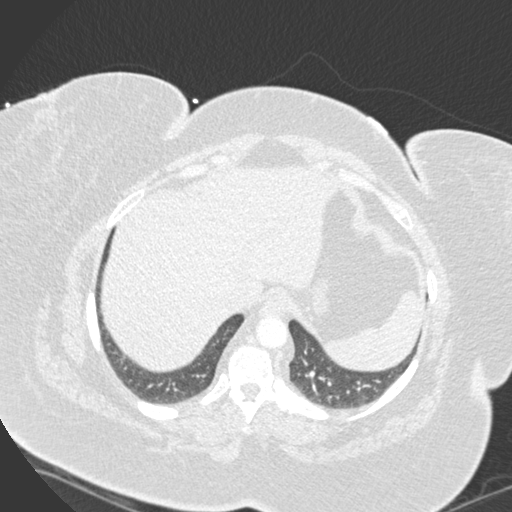
[im 54/216  mediastinal]
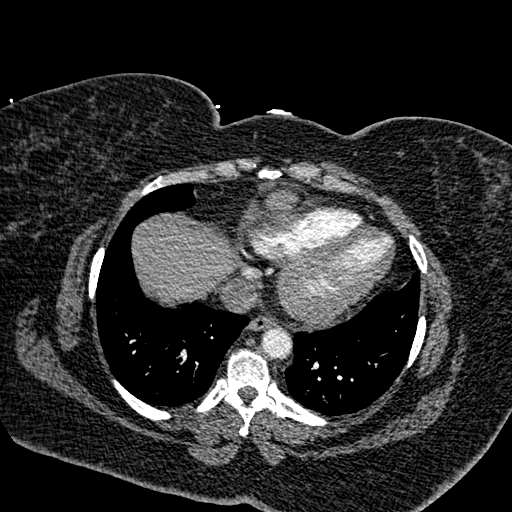
[im 65/216  lung]
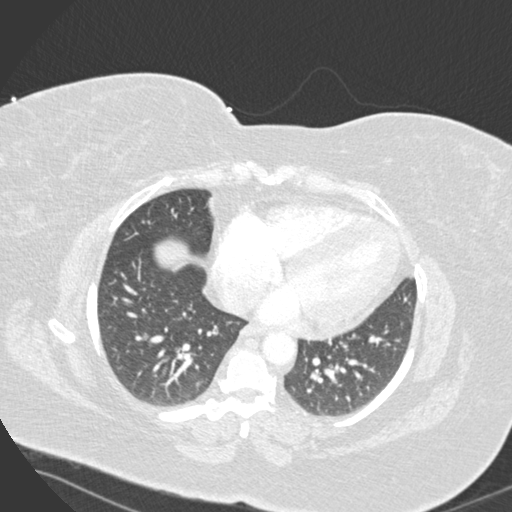
[im 72/216  mediastinal]
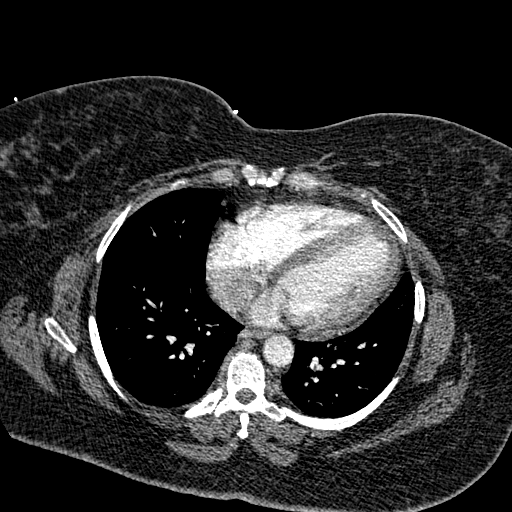
[im 76/216  lung]
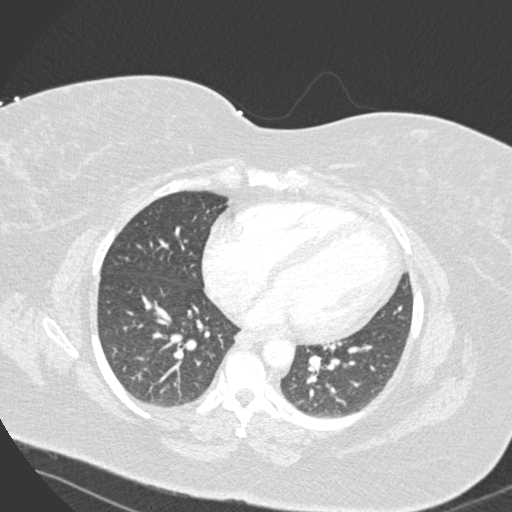
[im 87/216  mediastinal]
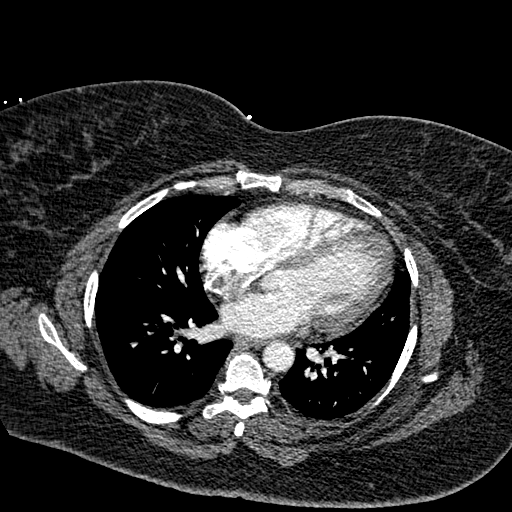
[im 97/216  lung]
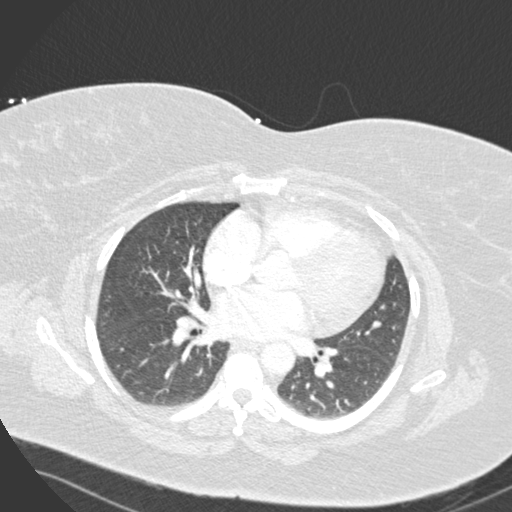
[im 108/216  mediastinal]
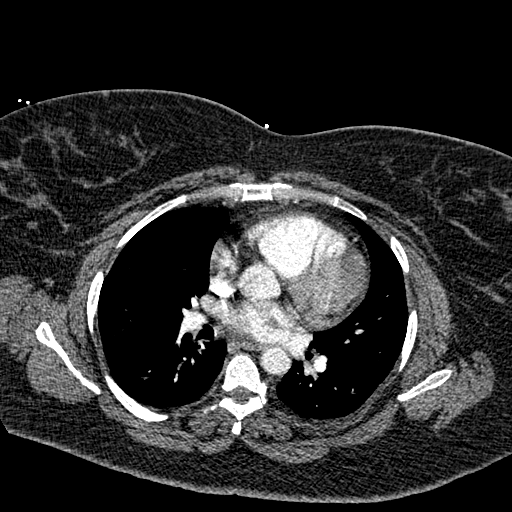
[im 119/216  lung]
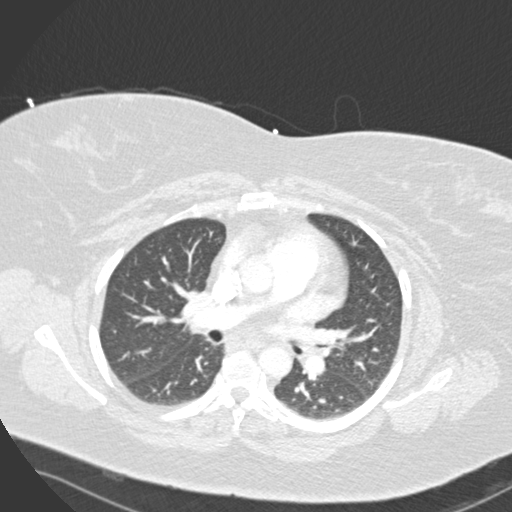
[im 130/216  mediastinal]
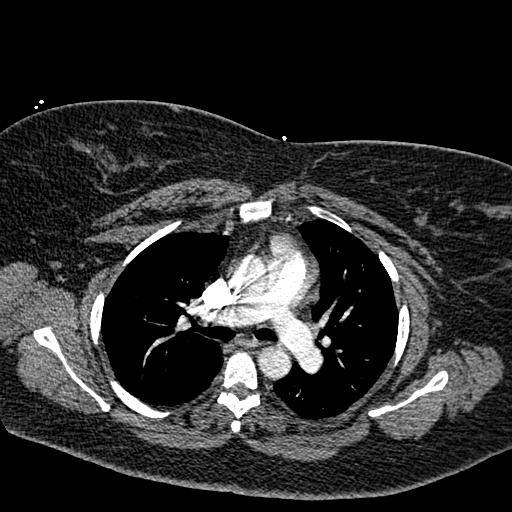
[im 140/216  lung]
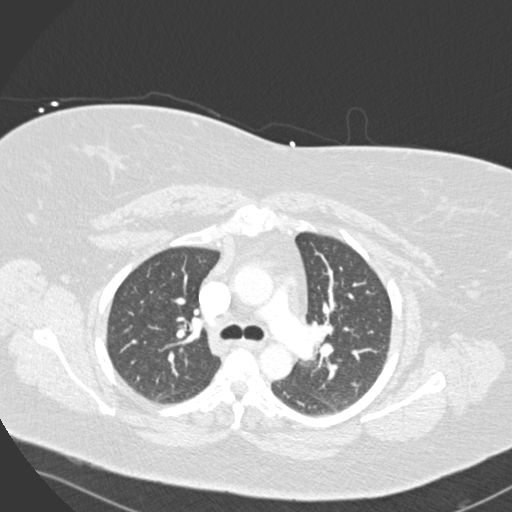
[im 144/216  mediastinal]
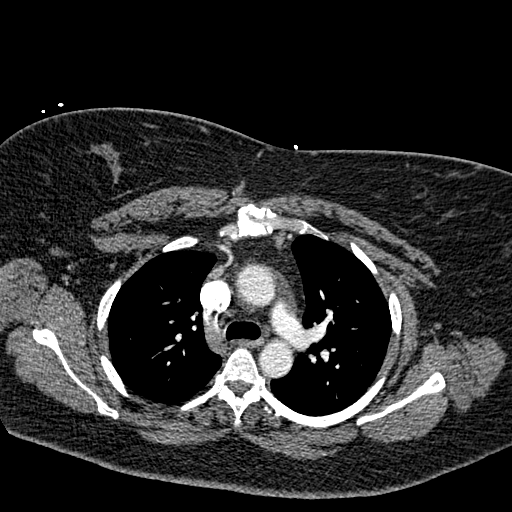
[im 151/216  lung]
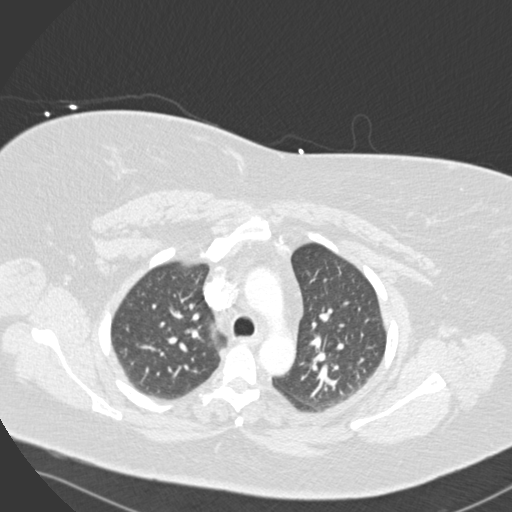
[im 162/216  mediastinal]
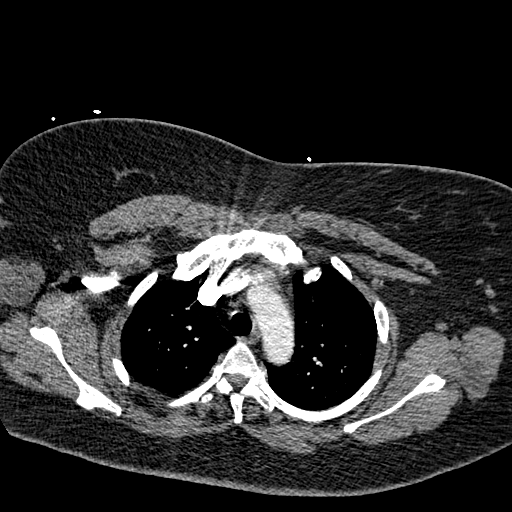
[im 183/216  lung]
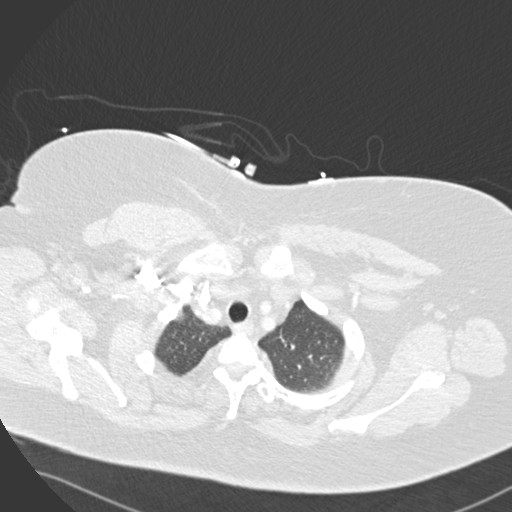
[im 194/216  mediastinal]
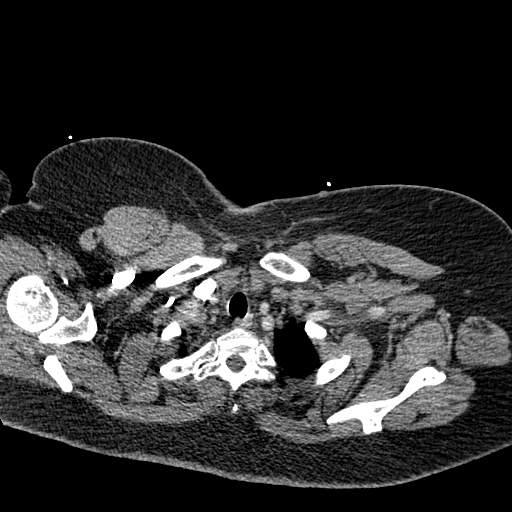
[im 205/216  lung]
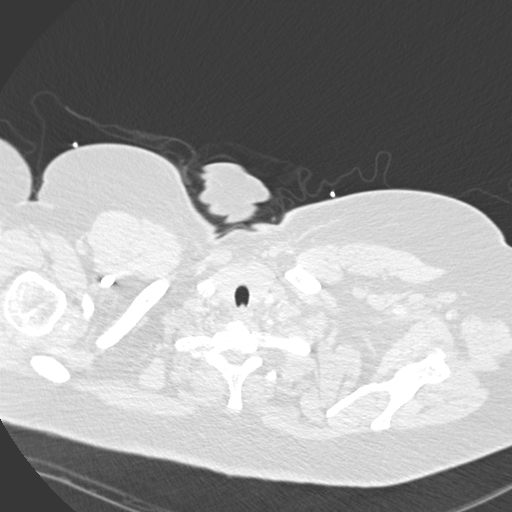

[19 of 32 positions shown; findings below may reference images not displayed]

FINDINGS: Suboptimal bolus timing may limit sensitivity for subsegmental
pulmonary emboli. The main pulmonary artery is not enlarged. No
pulmonary arterial filling defects to at least the segmental
branches.

Heart is upper limits of normal in size. Pericardium is
nonsuspicious. Two vessel aortic arch, thoracic aorta is normal.

Tracheobronchial tree is widely patent, no pneumothorax. Trace
bronchial wall thickening. No pleural effusions or focal
consolidations. No pulmonary nodules or masses.

No lymphadenopathy by CT size criteria. Thoracic esophagus is
unremarkable. Included view of the abdomen is normal. Osseous
structures are nonsuspicious, apparent sternal-manubrial
osteoarthrosis versus remote injury.

Review of the MIP images confirms the above findings.
IMPRESSION: No pulmonary embolism though, a suboptimal bolus timing may decrease
sensitivity for subsegmental pulmonary artery filling defects.

Trace bronchial wall thickening may reflect bronchitis without
pneumonia.

  By: Kieu Nga Tereza

## 2014-04-12 ENCOUNTER — Encounter (HOSPITAL_COMMUNITY): Payer: Self-pay | Admitting: Emergency Medicine

## 2014-04-30 ENCOUNTER — Emergency Department (HOSPITAL_COMMUNITY)
Admission: EM | Admit: 2014-04-30 | Discharge: 2014-04-30 | Disposition: A | Discharge status: Home / Self Care | Payer: Self-pay | Attending: Emergency Medicine | Admitting: Emergency Medicine

## 2014-04-30 ENCOUNTER — Encounter (HOSPITAL_COMMUNITY): Payer: Self-pay

## 2014-04-30 DIAGNOSIS — Z87891 Personal history of nicotine dependence: Secondary | ICD-10-CM | POA: Insufficient documentation

## 2014-04-30 DIAGNOSIS — Z8742 Personal history of other diseases of the female genital tract: Secondary | ICD-10-CM | POA: Insufficient documentation

## 2014-04-30 DIAGNOSIS — N390 Urinary tract infection, site not specified: Secondary | ICD-10-CM | POA: Insufficient documentation

## 2014-04-30 LAB — URINE MICROSCOPIC-ADD ON

## 2014-04-30 LAB — URINALYSIS, ROUTINE W REFLEX MICROSCOPIC
Bilirubin Urine: NEGATIVE
Glucose, UA: NEGATIVE mg/dL
KETONES UR: NEGATIVE mg/dL
Nitrite: NEGATIVE
PH: 6 (ref 5.0–8.0)
Protein, ur: 30 mg/dL — AB
SPECIFIC GRAVITY, URINE: 1.02 (ref 1.005–1.030)
Urobilinogen, UA: 0.2 mg/dL (ref 0.0–1.0)

## 2014-04-30 MED ORDER — CEPHALEXIN 500 MG PO CAPS: 500.0000 mg | ORAL_CAPSULE | Freq: Four times a day (QID) | ORAL | Status: DC

## 2014-04-30 MED ORDER — PHENAZOPYRIDINE HCL 200 MG PO TABS: 200.0000 mg | ORAL_TABLET | Freq: Three times a day (TID) | ORAL | Status: DC

## 2014-04-30 NOTE — ED Provider Notes (Signed)
CSN: [id number]     Arrival date & time 04/30/14  3299 History   First MD Initiated Contact with Patient 04/30/14 [phone]     Chief Complaint  Patient presents with  . Dysuria     HPI   Olivia Schmidt presents for evaluation of dysuria, urinary urgency, and urinary frequency. She reports 1 week of dysuria and urinary frequency which became much more significant last 2 days. She tried an over-the-counter topical medication that did not provide any relief. She denies fevers, abdominal pain, nausea, vomiting, vaginal discharge. She denies hematuria, or foul odor. She has had a hysterectomy. She has no history of urinary tract infections or kidney stones. Symptoms are moderate, constant, and worsening. Patient is not a diabetic.   Past Medical History  Diagnosis Date  . Fibroids    Past Surgical History  Procedure Laterality Date  . Abdominal hysterectomy     Family History  Problem Relation Age of Onset  . Aneurysm Father     brain  . Hypertension Sister   . Hypertension Brother    History  Substance Use Topics  . Smoking status: Former Smoker -- 24 years    Quit date: 06/12/2003  . Smokeless tobacco: Not on file  . Alcohol Use: No   OB History    Gravida Para Term Preterm AB TAB SAB Ectopic Multiple Living   1    1          Review of Systems  All other systems reviewed and are negative.     Allergies  Review of patient's allergies indicates no known allergies.  Home Medications   Prior to Admission medications   Medication Sig Start Date End Date Taking? Authorizing Provider  cephALEXin (KEFLEX) 500 MG capsule Take 1 capsule (500 mg total) by mouth 4 (four) times daily. 04/30/14   Quintella Reichert, MD  phenazopyridine (PYRIDIUM) 200 MG tablet Take 1 tablet (200 mg total) by mouth 3 (three) times daily. 04/30/14   Quintella Reichert, MD   BP 156/98 mmHg  Pulse 65  Temp(Src) 97.5 F (36.4 C) (Oral)  SpO2 99%  LMP 09/14/2010 Physical Exam  Constitutional: She is oriented  to person, place, and time. She appears well-developed and well-nourished.  HENT:  Head: Normocephalic and atraumatic.  Cardiovascular: Normal rate and regular rhythm.   No murmur heard. Pulmonary/Chest: Effort normal and breath sounds normal. No respiratory distress.  Abdominal: Soft. There is no tenderness. There is no rebound and no guarding.   No CVA tenderness  Musculoskeletal: She exhibits no edema or tenderness.  Neurological: She is alert and oriented to person, place, and time.  Skin: Skin is warm and dry.  Psychiatric: She has a normal mood and affect. Her behavior is normal.  Nursing note and vitals reviewed.   ED Course  Procedures (including critical care time) Labs Review Labs Reviewed  URINALYSIS, ROUTINE W REFLEX MICROSCOPIC - Abnormal; Notable for the following:    APPearance CLOUDY (*)    Hgb urine dipstick MODERATE (*)    Protein, ur 30 (*)    Leukocytes, UA LARGE (*)    All other components within normal limits  URINE MICROSCOPIC-ADD ON - Abnormal; Notable for the following:    Squamous Epithelial / LPF FEW (*)    Bacteria, UA MANY (*)    All other components within normal limits    Imaging Review No results found.   EKG Interpretation None      MDM   Final diagnoses:  Acute UTI (  urinary tract infection)    Patient here with dysuria, history and urinalysis consistent with acute urinary tract infection. This is consistent with lower tract infection and no evidence of pyelonephritis. There is blood in the urine that history is not consist renal colic. Discussed with patient home care for UTI as well as importance of return precautions. Treating with Keflex and Pyridium.    Quintella Reichert, MD 04/30/14 [phone]

## 2014-04-30 NOTE — Discharge Instructions (Signed)
Get rechecked if you develop fevers, abdominal pain, vomiting, or new concerning symptoms. Take to entire course of antibiotics.   Urinary Tract Infection Urinary tract infections (UTIs) can develop anywhere along your urinary tract. Your urinary tract is your body's drainage system for removing wastes and extra water. Your urinary tract includes two kidneys, two ureters, a bladder, and a urethra. Your kidneys are a pair of bean-shaped organs. Each kidney is about the size of your fist. They are located below your ribs, one on each side of your spine. CAUSES Infections are caused by microbes, which are microscopic organisms, including fungi, viruses, and bacteria. These organisms are so small that they can only be seen through a microscope. Bacteria are the microbes that most commonly cause UTIs. SYMPTOMS  Symptoms of UTIs may vary by age and gender of the patient and by the location of the infection. Symptoms in young women typically include a frequent and intense urge to urinate and a painful, burning feeling in the bladder or urethra during urination. Older women and men are more likely to be tired, shaky, and weak and have muscle aches and abdominal pain. A fever may mean the infection is in your kidneys. Other symptoms of a kidney infection include pain in your back or sides below the ribs, nausea, and vomiting. DIAGNOSIS To diagnose a UTI, your caregiver will ask you about your symptoms. Your caregiver also will ask to provide a urine sample. The urine sample will be tested for bacteria and white blood cells. White blood cells are made by your body to help fight infection. TREATMENT  Typically, UTIs can be treated with medication. Because most UTIs are caused by a bacterial infection, they usually can be treated with the use of antibiotics. The choice of antibiotic and length of treatment depend on your symptoms and the type of bacteria causing your infection. HOME CARE INSTRUCTIONS  If you  were prescribed antibiotics, take them exactly as your caregiver instructs you. Finish the medication even if you feel better after you have only taken some of the medication.  Drink enough water and fluids to keep your urine clear or pale yellow.  Avoid caffeine, tea, and carbonated beverages. They tend to irritate your bladder.  Empty your bladder often. Avoid holding urine for long periods of time.  Empty your bladder before and after sexual intercourse.  After a bowel movement, women should cleanse from front to back. Use each tissue only once. SEEK MEDICAL CARE IF:   You have back pain.  You develop a fever.  Your symptoms do not begin to resolve within 3 days. SEEK IMMEDIATE MEDICAL CARE IF:   You have severe back pain or lower abdominal pain.  You develop chills.  You have nausea or vomiting.  You have continued burning or discomfort with urination. MAKE SURE YOU:   Understand these instructions.  Will watch your condition.  Will get help right away if you are not doing well or get worse. Document Released: 03/07/2005 Document Revised: 11/27/2011 Document Reviewed: 07/06/2011 [facility] Patient Information 2015 [place], [place]. This information is not intended to replace advice given to you by your health care provider. Make sure you discuss any questions you have with your health care provider.

## 2014-04-30 NOTE — ED Notes (Signed)
Per pt, started having pain on Monday.  Burning with urination, frequent urination.  No fever.  No flank pain.  Lower abdominal pain noted at times.  No odor specifically noted.  Having trouble controlling bladder.

## 2014-05-25 ENCOUNTER — Encounter: Payer: Self-pay | Admitting: Internal Medicine

## 2014-05-25 ENCOUNTER — Ambulatory Visit: Payer: Self-pay | Attending: Internal Medicine | Admitting: Internal Medicine

## 2014-05-25 VITALS — BP 125/75 | HR 81 | Temp 97.5°F | Resp 16 | Ht 64.0 in | Wt 284.0 lb

## 2014-05-25 DIAGNOSIS — K0889 Other specified disorders of teeth and supporting structures: Secondary | ICD-10-CM

## 2014-05-25 DIAGNOSIS — Z2821 Immunization not carried out because of patient refusal: Secondary | ICD-10-CM

## 2014-05-25 DIAGNOSIS — R42 Dizziness and giddiness: Secondary | ICD-10-CM

## 2014-05-25 DIAGNOSIS — K088 Other specified disorders of teeth and supporting structures: Secondary | ICD-10-CM

## 2014-05-25 DIAGNOSIS — Z90710 Acquired absence of both cervix and uterus: Secondary | ICD-10-CM | POA: Insufficient documentation

## 2014-05-25 MED ORDER — MECLIZINE HCL 25 MG PO TABS: 25.0000 mg | ORAL_TABLET | Freq: Three times a day (TID) | ORAL | Status: DC | PRN

## 2014-05-25 NOTE — Progress Notes (Signed)
Patient ID: Olivia Schmidt, female   DOB: 06/30/1960, 53 y.o.   MRN: [id number]   QJJ:[id number]  [id number]  DOB - May 14, 1961  CC:  Chief Complaint  Patient presents with  . Establish Care       HPI: Olivia Schmidt is a 53 y.o. female here today to establish medical care.  Patient has a past medical history of fibroids in which she received a hysterectomy.  Patient states that she was diagnosed with vertigo 4 months ago and was given Meclizine which helped her symptoms.  Today she reports that the symptoms of dizziness have return for the past one week.  She denies any weakness, headaches, chest pain, nausea, vomiting, or tingling.  She also c/o of loose teeth that have been causing her pain for one month.  She is requesting a dental referral.    No Known Allergies Past Medical History  Diagnosis Date  . Fibroids    Current Outpatient Prescriptions on File Prior to Visit  Medication Sig Dispense Refill  . cephALEXin (KEFLEX) 500 MG capsule Take 1 capsule (500 mg total) by mouth 4 (four) times daily. (Patient not taking: Reported on 05/25/2014) 28 capsule 0  . phenazopyridine (PYRIDIUM) 200 MG tablet Take 1 tablet (200 mg total) by mouth 3 (three) times daily. (Patient not taking: Reported on 05/25/2014) 6 tablet 0   No current facility-administered medications on file prior to visit.   Family History  Problem Relation Age of Onset  . Aneurysm Father     brain  . Hypertension Sister   . Hypertension Brother    History   Social History  . Marital Status: Married    Spouse Name: N/A    Number of Children: N/A  . Years of Education: N/A   Occupational History  . Not on file.   Social History Main Topics  . Smoking status: Former Smoker -- 24 years    Quit date: 06/12/2003  . Smokeless tobacco: Not on file  . Alcohol Use: No  . Drug Use: No  . Sexual Activity: Yes    Birth Control/ Protection: Surgical   Other Topics Concern  . Not on file   Social History  Narrative    Review of Systems: See hPI   Objective:   Filed Vitals:   05/25/14 1557  BP: 125/75  Pulse: 81  Temp: 97.5 F (36.4 C)  Resp: 16    Physical Exam: Constitutional: Patient appears well-developed and well-nourished. No distress. HENT: Normocephalic, atraumatic, External right and left ear normal. Oropharynx is clear and moist. Poor dentition  Eyes: Conjunctivae and EOM are normal. PERRLA, no scleral icterus. Neck: Normal ROM. Neck supple. No JVD. No tracheal deviation. No thyromegaly. CVS: RRR, S1/S2 +, no murmurs, no gallops, no carotid bruit.  Pulmonary: Effort and breath sounds normal, no stridor, rhonchi, wheezes, rales.  Abdominal: Soft. BS +, no distension, tenderness, rebound or guarding.  Musculoskeletal: Normal range of motion. No edema and no tenderness.  Lymphadenopathy: No lymphadenopathy noted, cervical, Neuro: Alert.. No cranial nerve deficit. Skin: Skin is warm and dry. No rash noted. Not diaphoretic. No erythema. No pallor. Psychiatric: Normal mood and affect. Behavior, judgment, thought content normal.  Lab Results  Component Value Date   WBC 7.3 07/06/2013   HGB 11.6* 07/06/2013   HCT 36.7 07/06/2013   MCV 85.5 07/06/2013   PLT 190 07/06/2013   Lab Results  Component Value Date   CREATININE 0.79 07/06/2013   BUN 14 07/06/2013   NA 140 07/06/2013  K 4.5 07/06/2013   CL 101 07/06/2013   CO2 28 07/06/2013    No results found for: HGBA1C Lipid Panel  No results found for: CHOL, TRIG, HDL, CHOLHDL, VLDL, LDLCALC     Assessment and plan:   Olivia Schmidt was seen today for establish care.  Diagnoses and associated orders for this visit:  Vertigo - meclizine (ANTIVERT) 25 MG tablet; Take 1 tablet (25 mg total) by mouth 3 (three) times daily as needed for dizziness.  Loosening of tooth Dental list given   Refused influenza vaccine Explained that annual influenza is recommended per CDC guidelines and is highly suggested to anyone who  has has CHF, COPD, DM or immunocompromised. Benefits of influenza described in detail.    Return for physical.  The patient was given clear instructions to go to ER or return to medical center if symptoms don't improve, worsen or new problems develop. The patient verbalized understanding. The patient was told to call to get lab results if they haven't heard anything in the next week.     Chari Manning, NP-C [facility] [phone] 05/25/2014, 4:52 PM

## 2014-05-25 NOTE — Progress Notes (Signed)
Pt is here to establish care. Pt reports that her teeth need to be pulled and now her teeth are very loose. Pt states that she is been feeling very dizzy.

## 2014-05-25 NOTE — Patient Instructions (Signed)

## 2014-06-25 ENCOUNTER — Ambulatory Visit: Payer: Self-pay

## 2014-07-23 ENCOUNTER — Ambulatory Visit: Payer: No Typology Code available for payment source | Attending: Internal Medicine

## 2014-10-05 ENCOUNTER — Encounter (HOSPITAL_COMMUNITY): Payer: Self-pay

## 2014-10-05 ENCOUNTER — Emergency Department (HOSPITAL_COMMUNITY)
Admission: EM | Admit: 2014-10-05 | Discharge: 2014-10-05 | Disposition: A | Discharge status: Home / Self Care | Payer: No Typology Code available for payment source | Attending: Emergency Medicine | Admitting: Emergency Medicine

## 2014-10-05 DIAGNOSIS — R2243 Localized swelling, mass and lump, lower limb, bilateral: Secondary | ICD-10-CM | POA: Diagnosis not present

## 2014-10-05 DIAGNOSIS — G43009 Migraine without aura, not intractable, without status migrainosus: Secondary | ICD-10-CM | POA: Insufficient documentation

## 2014-10-05 DIAGNOSIS — Z87891 Personal history of nicotine dependence: Secondary | ICD-10-CM | POA: Diagnosis not present

## 2014-10-05 DIAGNOSIS — Z79899 Other long term (current) drug therapy: Secondary | ICD-10-CM | POA: Diagnosis not present

## 2014-10-05 DIAGNOSIS — Z792 Long term (current) use of antibiotics: Secondary | ICD-10-CM | POA: Insufficient documentation

## 2014-10-05 DIAGNOSIS — R51 Headache: Secondary | ICD-10-CM | POA: Diagnosis present

## 2014-10-05 DIAGNOSIS — Z86018 Personal history of other benign neoplasm: Secondary | ICD-10-CM | POA: Diagnosis not present

## 2014-10-05 DIAGNOSIS — M7989 Other specified soft tissue disorders: Secondary | ICD-10-CM

## 2014-10-05 LAB — BASIC METABOLIC PANEL
ANION GAP: 6 (ref 5–15)
BUN: 14 mg/dL (ref 6–23)
CALCIUM: 9.2 mg/dL (ref 8.4–10.5)
CHLORIDE: 106 mmol/L (ref 96–112)
CO2: 27 mmol/L (ref 19–32)
Creatinine, Ser: 0.6 mg/dL (ref 0.50–1.10)
GFR calc Af Amer: >90 mL/min (ref >90)
Glucose, Bld: 117 mg/dL — ABNORMAL HIGH (ref 70–99)
Potassium: 3.8 mmol/L (ref 3.5–5.1)
SODIUM: 139 mmol/L (ref 135–145)

## 2014-10-05 LAB — CBC WITH DIFFERENTIAL/PLATELET
Basophils Absolute: 0 10*3/uL (ref 0.0–0.1)
Basophils Relative: 0 % (ref 0–1)
Eosinophils Absolute: 0.2 10*3/uL (ref 0.0–0.7)
Eosinophils Relative: 3 % (ref 0–5)
HEMATOCRIT: 36.5 % (ref 36.0–46.0)
Hemoglobin: 11.5 g/dL — ABNORMAL LOW (ref 12.0–15.0)
Lymphocytes Relative: 34 % (ref 12–46)
Lymphs Abs: 1.8 10*3/uL (ref 0.7–4.0)
MCH: 26.8 pg (ref 26.0–34.0)
MCHC: 31.5 g/dL (ref 30.0–36.0)
MCV: 85.1 fL (ref 78.0–100.0)
Monocytes Absolute: 0.4 10*3/uL (ref 0.1–1.0)
Monocytes Relative: 7 % (ref 3–12)
NEUTROS PCT: 56 % (ref 43–77)
Neutro Abs: 3 10*3/uL (ref 1.7–7.7)
Platelets: 192 10*3/uL (ref 150–400)
RBC: 4.29 MIL/uL (ref 3.87–5.11)
RDW: 14 % (ref 11.5–15.5)
WBC: 5.4 10*3/uL (ref 4.0–10.5)

## 2014-10-05 LAB — URINE MICROSCOPIC-ADD ON

## 2014-10-05 LAB — URINALYSIS, ROUTINE W REFLEX MICROSCOPIC
BILIRUBIN URINE: NEGATIVE
Glucose, UA: NEGATIVE mg/dL
Ketones, ur: NEGATIVE mg/dL
Nitrite: NEGATIVE
PH: 5.5 (ref 5.0–8.0)
Protein, ur: NEGATIVE mg/dL
SPECIFIC GRAVITY, URINE: 1.033 — AB (ref 1.005–1.030)
UROBILINOGEN UA: 1 mg/dL (ref 0.0–1.0)

## 2014-10-05 MED ORDER — DIPHENHYDRAMINE HCL 50 MG/ML IJ SOLN
25.0000 mg | Freq: Once | INTRAMUSCULAR | Status: AC
  Administered 2014-10-05: 25 mg via INTRAVENOUS
  Filled 2014-10-05: qty 1

## 2014-10-05 MED ORDER — SODIUM CHLORIDE 0.9 % IV BOLUS (SEPSIS)
500.0000 mL | Freq: Once | INTRAVENOUS | Status: AC
  Administered 2014-10-05: 500 mL via INTRAVENOUS

## 2014-10-05 MED ORDER — PROCHLORPERAZINE EDISYLATE 5 MG/ML IJ SOLN
10.0000 mg | Freq: Four times a day (QID) | INTRAMUSCULAR | Status: DC | PRN
  Administered 2014-10-05: 10 mg via INTRAVENOUS
  Filled 2014-10-05: qty 2

## 2014-10-05 NOTE — ED Notes (Signed)
Pt asking to go home.

## 2014-10-05 NOTE — ED Notes (Signed)
Pt c/o intermittent head pain and "burning" x 2 weeks and R foot pain/swelling x 1 week.  Pain score 9/10.  Denies injury.  Pt reports taking extra strength Tylenol last night w/o relief.

## 2014-10-05 NOTE — Discharge Instructions (Signed)
Migraine Headache A migraine headache is an intense, throbbing pain on one or both sides of your head. A migraine can last for 30 minutes to several hours. CAUSES  The exact cause of a migraine headache is not always known. However, a migraine may be caused when nerves in the brain become irritated and release chemicals that cause inflammation. This causes pain. Certain things may also trigger migraines, such as:  Alcohol.  Smoking.  Stress.  Menstruation.  Aged cheeses.  Foods or drinks that contain nitrates, glutamate, aspartame, or tyramine.  Lack of sleep.  Chocolate.  Caffeine.  Hunger.  Physical exertion.  Fatigue.  Medicines used to treat chest pain (nitroglycerine), birth control pills, estrogen, and some blood pressure medicines. SIGNS AND SYMPTOMS  Pain on one or both sides of your head.  Pulsating or throbbing pain.  Severe pain that prevents daily activities.  Pain that is aggravated by any physical activity.  Nausea, vomiting, or both.  Dizziness.  Pain with exposure to bright lights, loud noises, or activity.  General sensitivity to bright lights, loud noises, or smells. Before you get a migraine, you may get warning signs that a migraine is coming (aura). An aura may include:  Seeing flashing lights.  Seeing bright spots, halos, or zigzag lines.  Having tunnel vision or blurred vision.  Having feelings of numbness or tingling.  Having trouble talking.  Having muscle weakness. DIAGNOSIS  A migraine headache is often diagnosed based on:  Symptoms.  Physical exam.  A CT scan or MRI of your head. These imaging tests cannot diagnose migraines, but they can help rule out other causes of headaches. TREATMENT Medicines may be given for pain and nausea. Medicines can also be given to help prevent recurrent migraines.  HOME CARE INSTRUCTIONS  Only take over-the-counter or prescription medicines for pain or discomfort as directed by your  health care provider. The use of long-term narcotics is not recommended.  Lie down in a dark, quiet room when you have a migraine.  Keep a journal to find out what may trigger your migraine headaches. For example, write down:  What you eat and drink.  How much sleep you get.  Any change to your diet or medicines.  Limit alcohol consumption.  Quit smoking if you smoke.  Get 7-9 hours of sleep, or as recommended by your health care provider.  Limit stress.  Keep lights dim if bright lights bother you and make your migraines worse. SEEK IMMEDIATE MEDICAL CARE IF:   Your migraine becomes severe.  You have a fever.  You have a stiff neck.  You have vision loss.  You have muscular weakness or loss of muscle control.  You start losing your balance or have trouble walking.  You feel faint or pass out.  You have severe symptoms that are different from your first symptoms. MAKE SURE YOU:   Understand these instructions.  Will watch your condition.  Will get help right away if you are not doing well or get worse. Document Released: 05/28/2005 Document Revised: 10/12/2013 Document Reviewed: 02/02/2013 [facility] Patient Information 2015 [place], [place]. This information is not intended to replace advice given to you by your health care provider. Make sure you discuss any questions you have with your health care provider.  Edema Edema is an abnormal buildup of fluids. It is more common in your legs and thighs. Painless swelling of the feet and ankles is more likely as a person ages. It also is common in looser skin, like  around your eyes. HOME CARE   Keep the affected body part above the level of the heart while lying down.  Do not sit still or stand for a long time.  Do not put anything right under your knees when you lie down.  Do not wear tight clothes on your upper legs.  Exercise your legs to help the puffiness (swelling) go down.  Wear elastic bandages or support  stockings as told by your doctor.  A low-salt diet may help lessen the puffiness.  Only take medicine as told by your doctor. GET HELP IF:  Treatment is not working.  You have heart, liver, or kidney disease and notice that your skin looks puffy or shiny.  You have puffiness in your legs that does not get better when you raise your legs.  You have sudden weight gain for no reason. GET HELP RIGHT AWAY IF:   You have shortness of breath or chest pain.  You cannot breathe when you lie down.  You have pain, redness, or warmth in the areas that are puffy.  You have heart, liver, or kidney disease and get edema all of a sudden.  You have a fever and your symptoms get worse all of a sudden. MAKE SURE YOU:   Understand these instructions.  Will watch your condition.  Will get help right away if you are not doing well or get worse. Document Released: 11/14/2007 Document Revised: 06/02/2013 Document Reviewed: 03/20/2013 [facility] Patient Information 2015 [place], [place]. This information is not intended to replace advice given to you by your health care provider. Make sure you discuss any questions you have with your health care provider.

## 2014-10-05 NOTE — ED Provider Notes (Signed)
CSN: [id number]     Arrival date & time 10/05/14  0901 History   First MD Initiated Contact with Patient 10/05/14 1014     Chief Complaint  Patient presents with  . Headache  . Foot Swelling     (Consider location/radiation/quality/duration/timing/severity/associated sxs/prior Treatment) HPI  This is a 54 year old female who presents with headache and bilateral foot swelling. Patient reports intermittent headache for the last 3 months. She states "it feels like it's on fire." She reports photosensitivity. Comes and goes. It is bitemporal. Denies history of migraines. It is worse at night. Current pain is 8 out of 10. She denies any vision changes, weakness, numbness, or tingling. Patient also reports bilateral lower extremity swelling for 1 week. Denies any history of heart failure. Denies chest pain or shortness of breath. Does report a high salt diet.  Past Medical History  Diagnosis Date  . Fibroids    Past Surgical History  Procedure Laterality Date  . Abdominal hysterectomy     Family History  Problem Relation Age of Onset  . Aneurysm Father     brain  . Hypertension Sister   . Hypertension Brother    History  Substance Use Topics  . Smoking status: Former Smoker -- 24 years    Quit date: 06/12/2003  . Smokeless tobacco: Not on file  . Alcohol Use: No   OB History    Gravida Para Term Preterm AB TAB SAB Ectopic Multiple Living   1    1          Review of Systems  Constitutional: Negative for fever.  Eyes: Positive for photophobia.  Respiratory: Negative for cough, chest tightness and shortness of breath.   Cardiovascular: Positive for leg swelling. Negative for chest pain.  Gastrointestinal: Negative for nausea, vomiting and abdominal pain.  Genitourinary: Negative for dysuria.  Musculoskeletal: Negative for back pain.  Skin: Negative for wound.  Neurological: Positive for headaches. Negative for weakness and numbness.  Psychiatric/Behavioral: Negative for  confusion.  All other systems reviewed and are negative.     Allergies  Review of patient's allergies indicates no known allergies.  Home Medications   Prior to Admission medications   Medication Sig Start Date End Date Taking? Authorizing Provider  acetaminophen (TYLENOL) 500 MG tablet Take 500 mg by mouth every 6 (six) hours as needed for moderate pain or headache.   Yes Historical Provider, MD  meclizine (ANTIVERT) 25 MG tablet Take 1 tablet (25 mg total) by mouth 3 (three) times daily as needed for dizziness. 05/25/14  Yes Lance Bosch, NP  cephALEXin (KEFLEX) 500 MG capsule Take 1 capsule (500 mg total) by mouth 4 (four) times daily. Patient not taking: Reported on 05/25/2014 04/30/14   Quintella Reichert, MD  phenazopyridine (PYRIDIUM) 200 MG tablet Take 1 tablet (200 mg total) by mouth 3 (three) times daily. Patient not taking: Reported on 05/25/2014 04/30/14   Quintella Reichert, MD   BP 110/50 mmHg  Pulse 93  Temp(Src) 97.7 F (36.5 C) (Oral)  Resp 15  SpO2 99%  LMP 09/14/2010 Physical Exam  Constitutional: She is oriented to person, place, and time. She appears well-developed and well-nourished. No distress.  HENT:  Head: Normocephalic and atraumatic.  Mouth/Throat: Oropharynx is clear and moist.  Eyes: Pupils are equal, round, and reactive to light.  Neck: Neck supple.  Cardiovascular: Normal rate, regular rhythm and normal heart sounds.   No murmur heard. Pulmonary/Chest: Effort normal and breath sounds normal. No respiratory distress. She has no  wheezes.  Abdominal: Soft. Bowel sounds are normal. There is no tenderness. There is no rebound.  Musculoskeletal:  1+ symmetric bilateral lower extremity edema  Neurological: She is alert and oriented to person, place, and time.  5 out of 5 strength in all 4 extremities, no dysmetria to finger-nose-finger  Skin: Skin is warm and dry.  Psychiatric: She has a normal mood and affect.  Nursing note and vitals reviewed.   ED  Course  Procedures (including critical care time) Labs Review Labs Reviewed  CBC WITH DIFFERENTIAL/PLATELET - Abnormal; Notable for the following:    Hemoglobin 11.5 (*)    All other components within normal limits  BASIC METABOLIC PANEL - Abnormal; Notable for the following:    Glucose, Bld 117 (*)    All other components within normal limits  URINALYSIS, ROUTINE W REFLEX MICROSCOPIC - Abnormal; Notable for the following:    APPearance CLOUDY (*)    Specific Gravity, Urine 1.033 (*)    Hgb urine dipstick TRACE (*)    Leukocytes, UA SMALL (*)    All other components within normal limits  URINE MICROSCOPIC-ADD ON - Abnormal; Notable for the following:    Squamous Epithelial / LPF MANY (*)    Bacteria, UA FEW (*)    Crystals CA OXALATE CRYSTALS (*)    All other components within normal limits    Imaging Review No results found.   EKG Interpretation None      MDM   Final diagnoses:  Migraine without aura and without status migrainosus, not intractable  Swelling of lower extremity   Patient presents with headache and lower ext swelling. Both symptoms are chronic in nature. Low suspicion for subarachnoid hemorrhage or meningitis. Suspect patient's bilateral lower extremity swelling is secondary to high salt diet. Patient given migraine cocktail. Basic labwork obtained and largely reassuring.  Following administration of migraine cocktail, patient reports improvement of symptoms iand is requesting discharged home.  Will have patient follow-up with primary physician.  After history, exam, and medical workup I feel the patient has been appropriately medically screened and is safe for discharge home. Pertinent diagnoses were discussed with the patient. Patient was given return precautions.   Merryl Hacker, MD 10/05/14 [phone]

## 2014-10-11 ENCOUNTER — Encounter: Payer: Self-pay | Admitting: Internal Medicine

## 2014-10-11 ENCOUNTER — Ambulatory Visit: Payer: No Typology Code available for payment source | Attending: Internal Medicine | Admitting: Internal Medicine

## 2014-10-11 VITALS — BP 125/78 | HR 104 | Temp 98.1°F | Resp 16 | Ht 64.0 in | Wt 269.0 lb

## 2014-10-11 DIAGNOSIS — M25472 Effusion, left ankle: Secondary | ICD-10-CM | POA: Insufficient documentation

## 2014-10-11 DIAGNOSIS — M25471 Effusion, right ankle: Secondary | ICD-10-CM | POA: Diagnosis not present

## 2014-10-11 DIAGNOSIS — Z87891 Personal history of nicotine dependence: Secondary | ICD-10-CM | POA: Insufficient documentation

## 2014-10-11 DIAGNOSIS — R519 Headache, unspecified: Secondary | ICD-10-CM

## 2014-10-11 DIAGNOSIS — R51 Headache: Secondary | ICD-10-CM | POA: Diagnosis present

## 2014-10-11 DIAGNOSIS — R609 Edema, unspecified: Secondary | ICD-10-CM

## 2014-10-11 MED ORDER — TRAMADOL HCL 50 MG PO TABS: 50.0000 mg | ORAL_TABLET | Freq: Two times a day (BID) | ORAL | Status: DC | PRN

## 2014-10-11 MED ORDER — POTASSIUM CHLORIDE ER 10 MEQ PO TBCR: 10.0000 meq | EXTENDED_RELEASE_TABLET | Freq: Every day | ORAL | Status: DC

## 2014-10-11 MED ORDER — FUROSEMIDE 20 MG PO TABS: 20.0000 mg | ORAL_TABLET | Freq: Every day | ORAL | Status: DC

## 2014-10-11 NOTE — Progress Notes (Signed)
Pt is here today having reoccurring swelling in her left and right legs and feet.

## 2014-10-11 NOTE — Progress Notes (Signed)
Patient ID: Olivia Schmidt, female   DOB: 10/20/1960, 54 y.o.   MRN: [id number]  CC: headaches, ankle swelling  HPI: Olivia Schmidt is a 54 y.o. female here today for a follow up visit.  Patient reports that she was seen in the ER several days ago for BLE edema. She reports that she was told nothing was found to be the cause and she should f/u with her PCP. Patient does admit to having a high sodium diet, She has tried OTC diuretic pills with some relief.  She also reports generalized headaches described as a throb and burning sensation. No nausea, vomiting, photophobia, phonophobia. She reports that she often feels like passing out so she takes a meclizine. No history of headaches in the past. Father and aunt both had aneurysm, she is concerned she may have one. States that she has been using excedrin and cold rags on her head with minimal relief. No history of HTN.    Patient has No chest pain, No abdominal pain - No Nausea, No new weakness tingling or numbness, No Cough - SOB.  No Known Allergies Past Medical History  Diagnosis Date  . Fibroids    Current Outpatient Prescriptions on File Prior to Visit  Medication Sig Dispense Refill  . acetaminophen (TYLENOL) 500 MG tablet Take 500 mg by mouth every 6 (six) hours as needed for moderate pain or headache.    . cephALEXin (KEFLEX) 500 MG capsule Take 1 capsule (500 mg total) by mouth 4 (four) times daily. (Patient not taking: Reported on 05/25/2014) 28 capsule 0  . meclizine (ANTIVERT) 25 MG tablet Take 1 tablet (25 mg total) by mouth 3 (three) times daily as needed for dizziness. (Patient not taking: Reported on 10/11/2014) 90 tablet 1  . phenazopyridine (PYRIDIUM) 200 MG tablet Take 1 tablet (200 mg total) by mouth 3 (three) times daily. (Patient not taking: Reported on 05/25/2014) 6 tablet 0   No current facility-administered medications on file prior to visit.   Family History  Problem Relation Age of Onset  . Aneurysm Father     brain   . Hypertension Sister   . Hypertension Brother    History   Social History  . Marital Status: Married    Spouse Name: N/A  . Number of Children: N/A  . Years of Education: N/A   Occupational History  . Not on file.   Social History Main Topics  . Smoking status: Former Smoker -- 24 years    Quit date: 06/12/2003  . Smokeless tobacco: Not on file  . Alcohol Use: No  . Drug Use: No  . Sexual Activity: Yes    Birth Control/ Protection: Surgical   Other Topics Concern  . Not on file   Social History Narrative    Review of Systems  Eyes: Negative.   Respiratory: Negative.   Cardiovascular: Negative.   Neurological: Positive for dizziness and headaches.     Objective:   Filed Vitals:   10/11/14 1540  BP: 125/78  Pulse: 104  Temp: 98.1 F (36.7 C)  Resp: 16    Physical Exam  Constitutional: She is oriented to person, place, and time.  Eyes: EOM are normal. Pupils are equal, round, and reactive to light.  Cardiovascular: Normal rate, regular rhythm and normal heart sounds.   Pulmonary/Chest: Effort normal.  Neurological: She is alert and oriented to person, place, and time. She displays normal reflexes. No cranial nerve deficit. Coordination normal.  Skin: Skin is warm and dry.  Lab Results  Component Value Date   WBC 5.4 10/05/2014   HGB 11.5* 10/05/2014   HCT 36.5 10/05/2014   MCV 85.1 10/05/2014   PLT 192 10/05/2014   Lab Results  Component Value Date   CREATININE 0.60 10/05/2014   BUN 14 10/05/2014   NA 139 10/05/2014   K 3.8 10/05/2014   CL 106 10/05/2014   CO2 27 10/05/2014    No results found for: HGBA1C Lipid Panel  No results found for: CHOL, TRIG, HDL, CHOLHDL, VLDL, LDLCALC     Assessment and plan:   Talana was seen today for follow-up.  Diagnoses and all orders for this visit:  New onset of headaches after age 40 Orders: -     CT Head W Contrast; Future---r/o aneurysm or tumor -     traMADol (ULTRAM) 50 MG tablet;  Take 1 tablet (50 mg total) by mouth every 12 (twelve) hours as needed. Headaches do not sound characteristic of migraine and patient denies ever having headaches in past or as severe as this.  Edema Orders: -     furosemide (LASIX) 20 MG tablet; Take 1 tablet (20 mg total) by mouth daily. -     potassium chloride (K-DUR) 10 MEQ tablet; Take 1 tablet (10 mEq total) by mouth daily. Only with furosemide use Will give short course of diuretic and advised low sodium diet, elevation, and compression socks while working     Return if symptoms worsen or fail to improve.   Chari Manning, NP-C [facility] [phone] 10/11/2014, 5:01 PM ]

## 2014-10-11 NOTE — Patient Instructions (Addendum)
Try compression socks and elevate the legs when not at work    Edema Edema is an abnormal buildup of fluids in your bodytissues. Edema is somewhatdependent on gravity to pull the fluid to the lowest place in your body. That makes the condition more common in the legs and thighs (lower extremities). Painless swelling of the feet and ankles is common and becomes more likely as you get older. It is also common in looser tissues, like around your eyes.  When the affected area is squeezed, the fluid may move out of that spot and leave a dent for a few moments. This dent is called pitting.  CAUSES  There are many possible causes of edema. Eating too much salt and being on your feet or sitting for a long time can cause edema in your legs and ankles. Hot weather may make edema worse. Common medical causes of edema include:  Heart failure.  Liver disease.  Kidney disease.  Weak blood vessels in your legs.  Cancer.  An injury.  Pregnancy.  Some medications.  Obesity. SYMPTOMS  Edema is usually painless.Your skin may look swollen or shiny.  DIAGNOSIS  Your health care provider may be able to diagnose edema by asking about your medical history and doing a physical exam. You may need to have tests such as X-rays, an electrocardiogram, or blood tests to check for medical conditions that may cause edema.  TREATMENT  Edema treatment depends on the cause. If you have heart, liver, or kidney disease, you need the treatment appropriate for these conditions. General treatment may include:  Elevation of the affected body part above the level of your heart.  Compression of the affected body part. Pressure from elastic bandages or support stockings squeezes the tissues and forces fluid back into the blood vessels. This keeps fluid from entering the tissues.  Restriction of fluid and salt intake.  Use of a water pill (diuretic). These medications are appropriate only for some types of edema. They  pull fluid out of your body and make you urinate more often. This gets rid of fluid and reduces swelling, but diuretics can have side effects. Only use diuretics as directed by your health care provider. HOME CARE INSTRUCTIONS   Keep the affected body part above the level of your heart when you are lying down.   Do not sit still or stand for prolonged periods.   Do not put anything directly under your knees when lying down.  Do not wear constricting clothing or garters on your upper legs.   Exercise your legs to work the fluid back into your blood vessels. This may help the swelling go down.   Wear elastic bandages or support stockings to reduce ankle swelling as directed by your health care provider.   Eat a low-salt diet to reduce fluid if your health care provider recommends it.   Only take medicines as directed by your health care provider. SEEK MEDICAL CARE IF:   Your edema is not responding to treatment.  You have heart, liver, or kidney disease and notice symptoms of edema.  You have edema in your legs that does not improve after elevating them.   You have sudden and unexplained weight gain. SEEK IMMEDIATE MEDICAL CARE IF:   You develop shortness of breath or chest pain.   You cannot breathe when you lie down.  You develop pain, redness, or warmth in the swollen areas.   You have heart, liver, or kidney disease and suddenly get edema.  You have a fever and your symptoms suddenly get worse. MAKE SURE YOU:   Understand these instructions.  Will watch your condition.  Will get help right away if you are not doing well or get worse. Document Released: 05/28/2005 Document Revised: 10/12/2013 Document Reviewed: 03/20/2013 [facility] Patient Information 2015 [place], [place]. This information is not intended to replace advice given to you by your health care provider. Make sure you discuss any questions you have with your health care provider.

## 2014-10-22 ENCOUNTER — Encounter (HOSPITAL_COMMUNITY): Payer: Self-pay

## 2014-10-22 ENCOUNTER — Ambulatory Visit (HOSPITAL_COMMUNITY)
Admission: RE | Admit: 2014-10-22 | Discharge: 2014-10-22 | Disposition: A | Discharge status: Home / Self Care | Payer: No Typology Code available for payment source | Source: Ambulatory Visit | Attending: Internal Medicine | Admitting: Internal Medicine

## 2014-10-22 ENCOUNTER — Other Ambulatory Visit: Payer: Self-pay | Admitting: Internal Medicine

## 2014-10-22 DIAGNOSIS — R519 Headache, unspecified: Secondary | ICD-10-CM

## 2014-10-22 DIAGNOSIS — R51 Headache: Secondary | ICD-10-CM | POA: Insufficient documentation

## 2014-10-22 MED ORDER — IOHEXOL 300 MG/ML  SOLN
75.0000 mL | Freq: Once | INTRAMUSCULAR | Status: AC | PRN
  Administered 2014-10-22: 75 mL via INTRAVENOUS

## 2014-11-03 ENCOUNTER — Telehealth: Payer: Self-pay | Admitting: *Deleted

## 2014-11-03 NOTE — Telephone Encounter (Signed)
Gave results.  Patient had no further questions and verbalized understanding.

## 2014-11-03 NOTE — Telephone Encounter (Signed)
-----   Message from Tresa Garter, MD sent at 10/27/2014  4:41 PM EDT ----- Please inform patient that her head CT is negative for acute disease. There is no radiographic explanation for her headaches, advise patient to continue current medication and if headache persists will refer to neurologist.

## 2014-12-01 ENCOUNTER — Ambulatory Visit: Payer: No Typology Code available for payment source | Admitting: Internal Medicine

## 2014-12-20 ENCOUNTER — Ambulatory Visit: Payer: No Typology Code available for payment source

## 2014-12-21 ENCOUNTER — Encounter (HOSPITAL_COMMUNITY): Payer: Self-pay | Admitting: Emergency Medicine

## 2014-12-21 ENCOUNTER — Emergency Department (HOSPITAL_COMMUNITY)
Admission: EM | Admit: 2014-12-21 | Discharge: 2014-12-22 | Disposition: A | Discharge status: Home / Self Care | Payer: Self-pay | Attending: Emergency Medicine | Admitting: Emergency Medicine

## 2014-12-21 DIAGNOSIS — Z90710 Acquired absence of both cervix and uterus: Secondary | ICD-10-CM | POA: Insufficient documentation

## 2014-12-21 DIAGNOSIS — N200 Calculus of kidney: Secondary | ICD-10-CM | POA: Insufficient documentation

## 2014-12-21 DIAGNOSIS — Z87891 Personal history of nicotine dependence: Secondary | ICD-10-CM | POA: Insufficient documentation

## 2014-12-21 DIAGNOSIS — R61 Generalized hyperhidrosis: Secondary | ICD-10-CM | POA: Insufficient documentation

## 2014-12-21 DIAGNOSIS — Z86018 Personal history of other benign neoplasm: Secondary | ICD-10-CM | POA: Insufficient documentation

## 2014-12-21 LAB — CBC WITH DIFFERENTIAL/PLATELET
BASOS PCT: 0 % (ref 0–1)
Basophils Absolute: 0 10*3/uL (ref 0.0–0.1)
EOS ABS: 0.2 10*3/uL (ref 0.0–0.7)
Eosinophils Relative: 2 % (ref 0–5)
HEMATOCRIT: 32.3 % — AB (ref 36.0–46.0)
HEMOGLOBIN: 10.3 g/dL — AB (ref 12.0–15.0)
LYMPHS ABS: 2.8 10*3/uL (ref 0.7–4.0)
Lymphocytes Relative: 37 % (ref 12–46)
MCH: 24.4 pg — ABNORMAL LOW (ref 26.0–34.0)
MCHC: 31.9 g/dL (ref 30.0–36.0)
MCV: 76.5 fL — ABNORMAL LOW (ref 78.0–100.0)
MONO ABS: 0.5 10*3/uL (ref 0.1–1.0)
Monocytes Relative: 7 % (ref 3–12)
Neutro Abs: 3.9 10*3/uL (ref 1.7–7.7)
Neutrophils Relative %: 53 % (ref 43–77)
Platelets: 216 10*3/uL (ref 150–400)
RBC: 4.22 MIL/uL (ref 3.87–5.11)
RDW: 14.2 % (ref 11.5–15.5)
WBC: 7.4 10*3/uL (ref 4.0–10.5)

## 2014-12-21 LAB — I-STAT CG4 LACTIC ACID, ED: Lactic Acid, Venous: 1.61 mmol/L (ref 0.5–2.0)

## 2014-12-21 MED ORDER — MORPHINE SULFATE 4 MG/ML IJ SOLN
4.0000 mg | Freq: Once | INTRAMUSCULAR | Status: AC
  Administered 2014-12-21: 4 mg via INTRAVENOUS
  Filled 2014-12-21: qty 1

## 2014-12-21 MED ORDER — SODIUM CHLORIDE 0.9 % IV BOLUS (SEPSIS)
1000.0000 mL | Freq: Once | INTRAVENOUS | Status: AC
  Administered 2014-12-21: 1000 mL via INTRAVENOUS

## 2014-12-21 MED ORDER — ONDANSETRON HCL 4 MG/2ML IJ SOLN
4.0000 mg | Freq: Once | INTRAMUSCULAR | Status: AC
  Administered 2014-12-21: 4 mg via INTRAVENOUS
  Filled 2014-12-21: qty 2

## 2014-12-21 NOTE — ED Provider Notes (Signed)
CSN: [id number]     Arrival date & time 12/21/14  2300 History  This chart was scribed for Linton Flemings, MD by Randa Evens, ED Scribe. This patient was seen in room A07C/A07C and the patient's care was started at 11:14 PM.      Chief Complaint  Patient presents with  . Abdominal Pain    The patient said she started having abdominal pain and she says she has been straining to pee.  She rates her pain 10/10.   Patient is a 54 y.o. female presenting with abdominal pain. The history is provided by the patient. No language interpreter was used.  Abdominal Pain Associated symptoms: nausea and vomiting    HPI Comments: Olivia Schmidt is a 54 y.o. female with PMHx of fibroids and abdominal hysterectomy who presents to the Emergency Department complaining of sudden left sided abdominal pain onset tonight at 9 :30 PM. Pt states that the pain began after eating. Pt report associated nausea and vomiting. Pt also reports difficulty urinating. Pt doesn't report any alleviating symptoms. Pt doesn't report any medications PTA. Pt states she previously has had difficulty urinating and was told she had an UTI. Pt doesn't report any other symptoms.   Past Medical History  Diagnosis Date  . Fibroids    Past Surgical History  Procedure Laterality Date  . Abdominal hysterectomy     Family History  Problem Relation Age of Onset  . Aneurysm Father     brain  . Hypertension Sister   . Hypertension Brother    History  Substance Use Topics  . Smoking status: Former Smoker -- 24 years    Quit date: 06/12/2003  . Smokeless tobacco: Not on file  . Alcohol Use: No   OB History    Gravida Para Term Preterm AB TAB SAB Ectopic Multiple Living   1    1          Review of Systems  Gastrointestinal: Positive for nausea, vomiting and abdominal pain.  Genitourinary: Positive for difficulty urinating.  All other systems reviewed and are negative.     Allergies  Review of patient's allergies indicates  no known allergies.  Home Medications   Prior to Admission medications   Medication Sig Start Date End Date Taking? Authorizing Provider  acetaminophen (TYLENOL) 500 MG tablet Take 1,000 mg by mouth every 6 (six) hours as needed for moderate pain or headache.    Yes Historical Provider, MD  aspirin-acetaminophen-caffeine (EXCEDRIN MIGRAINE) [phone] MG per tablet Take 1-2 tablets by mouth every 6 (six) hours as needed for headache.    Yes Historical Provider, MD  Naproxen Sod-Diphenhydramine (ALEVE PM) 220-25 MG TABS Take 1 tablet by mouth daily as needed (pain).   Yes Historical Provider, MD   BP 150/51 mmHg  Pulse 103  Temp(Src) 97.4 F (36.3 C) (Oral)  Resp 18  SpO2 100%  LMP 09/14/2010   Physical Exam  Constitutional: She is oriented to person, place, and time. She appears well-developed and well-nourished. She appears distressed.  HENT:  Head: Normocephalic and atraumatic.  Nose: Nose normal.  Mouth/Throat: Oropharynx is clear and moist.  Eyes: Conjunctivae and EOM are normal. Pupils are equal, round, and reactive to light.  Neck: Normal range of motion. Neck supple. No JVD present. No tracheal deviation present. No thyromegaly present.  Cardiovascular: Normal rate, regular rhythm, normal heart sounds and intact distal pulses.  Exam reveals no gallop and no friction rub.   No murmur heard. Pulmonary/Chest: Effort normal and  breath sounds normal. No stridor. No respiratory distress. She has no wheezes. She has no rales. She exhibits no tenderness.  Abdominal: Soft. Bowel sounds are normal. She exhibits no distension and no mass. There is tenderness (TTP over LUQ, umbilicus, LLQ). There is no rebound and no guarding.  Musculoskeletal: Normal range of motion. She exhibits no edema or tenderness.  Lymphadenopathy:    She has no cervical adenopathy.  Neurological: She is alert and oriented to person, place, and time. She displays normal reflexes. She exhibits normal muscle tone.  Coordination normal.  Skin: Skin is warm. No rash noted. She is diaphoretic. No erythema. No pallor.  Psychiatric: She has a normal mood and affect. Her behavior is normal. Judgment and thought content normal.  Nursing note and vitals reviewed.   ED Course  Procedures (including critical care time) DIAGNOSTIC STUDIES: Oxygen Saturation is 100% on RA, normal by my interpretation.    COORDINATION OF CARE: 11:23 PM-Discussed treatment plan with pt at bedside and pt agreed to plan.     Labs Review Labs Reviewed  CBC WITH DIFFERENTIAL/PLATELET - Abnormal; Notable for the following:    Hemoglobin 10.3 (*)    HCT 32.3 (*)    MCV 76.5 (*)    MCH 24.4 (*)    All other components within normal limits  COMPREHENSIVE METABOLIC PANEL - Abnormal; Notable for the following:    Potassium 3.3 (*)    Glucose, Bld 142 (*)    All other components within normal limits  LIPASE, BLOOD - Abnormal; Notable for the following:    Lipase 13 (*)    All other components within normal limits  URINALYSIS, ROUTINE W REFLEX MICROSCOPIC (NOT AT [facility]) - Abnormal; Notable for the following:    APPearance CLOUDY (*)    Hgb urine dipstick SMALL (*)    All other components within normal limits  URINE MICROSCOPIC-ADD ON - Abnormal; Notable for the following:    Crystals CA OXALATE CRYSTALS (*)    All other components within normal limits  URINE CULTURE  I-STAT CG4 LACTIC ACID, ED    Imaging Review Ct Abdomen Pelvis W Contrast  12/22/2014   CLINICAL DATA:  54 year old female with left lower quadrant pain nausea and vomiting.  EXAM: CT ABDOMEN AND PELVIS WITH CONTRAST  TECHNIQUE: Multidetector CT imaging of the abdomen and pelvis was performed using the standard protocol following bolus administration of intravenous contrast.  CONTRAST:  32mL OMNIPAQUE IOHEXOL 300 MG/ML SOLN, 157mL OMNIPAQUE IOHEXOL 300 MG/ML SOLN  COMPARISON:  CT dated 07/06/2010  FINDINGS: The visualized lung bases are clear.  Mild cardiomegaly.   No intra-abdominal free air no free fluid.  Subcentimeter right hepatic hypodense lesion is too small to characterize. The gallbladder, pancreas appear unremarkable. Focal 1.5 cm ill-defined splenic hypodensity is not well characterized but may represent a hemangioma. The adrenal glands, right kidney, right ureter appear unremarkable. There is a 2 mm calculus along the posterior wall of the urinary bladder adjacent to the left ureterovesical junction which may represent a recently passed left renal calculus versus a left UVJ stone. There is mild left hydronephrosis. The correlation with urinalysis recommended to exclude superimposed UTI. The urinary bladder is grossly unremarkable. Hysterectomy.  There is no evidence of bowel obstruction or inflammation. Normal appendix.  The visualized abdominal aorta and IVC are patent. No portal venous gas identified. There is no adenopathy. Degenerative changes of the spine. No acute fracture.  IMPRESSION: A 2 mm left UVJ stone versus a recently passed left  renal calculus with mild left hydronephrosis. Correlation with urinalysis recommended to exclude superimposed UTI.   Electronically Signed   By: Anner Crete M.D.   On: 12/22/2014 02:47     EKG Interpretation None      MDM   Final diagnoses:  Kidney stone on left side      I personally performed the services described in this documentation, which was scribed in my presence. The recorded information has been reviewed and is accurate.  54 yo female with acute onset of let sided abdominal pain, dysuria, n/v.  Plan for labs, pain/nausea control, possible CT scan.     Linton Flemings, MD 12/22/14 [phone]

## 2014-12-21 NOTE — ED Notes (Signed)
The patient said she started having abdominal pain and she says she has been straining to pee.  She rates her pain 10/10.  She has also been nauseated and vomiting.

## 2014-12-22 ENCOUNTER — Emergency Department (HOSPITAL_COMMUNITY): Payer: No Typology Code available for payment source

## 2014-12-22 LAB — COMPREHENSIVE METABOLIC PANEL
ALBUMIN: 3.5 g/dL (ref 3.5–5.0)
ALT: 24 U/L (ref 14–54)
ANION GAP: 11 (ref 5–15)
AST: 33 U/L (ref 15–41)
Alkaline Phosphatase: 99 U/L (ref 38–126)
BUN: 12 mg/dL (ref 6–20)
CO2: 23 mmol/L (ref 22–32)
CREATININE: 0.73 mg/dL (ref 0.44–1.00)
Calcium: 9.6 mg/dL (ref 8.9–10.3)
Chloride: 104 mmol/L (ref 101–111)
Glucose, Bld: 142 mg/dL — ABNORMAL HIGH (ref 65–99)
POTASSIUM: 3.3 mmol/L — AB (ref 3.5–5.1)
SODIUM: 138 mmol/L (ref 135–145)
TOTAL PROTEIN: 7.2 g/dL (ref 6.5–8.1)
Total Bilirubin: 0.4 mg/dL (ref 0.3–1.2)

## 2014-12-22 LAB — URINALYSIS, ROUTINE W REFLEX MICROSCOPIC
Bilirubin Urine: NEGATIVE
Glucose, UA: NEGATIVE mg/dL
Ketones, ur: NEGATIVE mg/dL
Leukocytes, UA: NEGATIVE
NITRITE: NEGATIVE
PH: 5.5 (ref 5.0–8.0)
Protein, ur: NEGATIVE mg/dL
Specific Gravity, Urine: 1.023 (ref 1.005–1.030)
Urobilinogen, UA: 0.2 mg/dL (ref 0.0–1.0)

## 2014-12-22 LAB — URINE MICROSCOPIC-ADD ON

## 2014-12-22 LAB — LIPASE, BLOOD: Lipase: 13 U/L — ABNORMAL LOW (ref 22–51)

## 2014-12-22 MED ORDER — IOHEXOL 300 MG/ML  SOLN
25.0000 mL | Freq: Once | INTRAMUSCULAR | Status: AC | PRN
  Administered 2014-12-22: 25 mL via ORAL

## 2014-12-22 MED ORDER — HYDROMORPHONE HCL 1 MG/ML IJ SOLN
1.0000 mg | Freq: Once | INTRAMUSCULAR | Status: AC
  Administered 2014-12-22: 1 mg via INTRAVENOUS
  Filled 2014-12-22: qty 1

## 2014-12-22 MED ORDER — NAPROXEN 250 MG PO TABS: 500.0000 mg | ORAL_TABLET | Freq: Once | ORAL | Status: DC

## 2014-12-22 MED ORDER — IOHEXOL 300 MG/ML  SOLN
100.0000 mL | Freq: Once | INTRAMUSCULAR | Status: AC | PRN
  Administered 2014-12-22: 100 mL via INTRAVENOUS

## 2014-12-22 MED ORDER — OXYCODONE-ACETAMINOPHEN 5-325 MG PO TABS: 2.0000 | ORAL_TABLET | ORAL | Status: DC | PRN

## 2014-12-22 MED ORDER — ONDANSETRON 8 MG PO TBDP: 8.0000 mg | ORAL_TABLET | Freq: Three times a day (TID) | ORAL | Status: DC | PRN

## 2014-12-22 NOTE — Discharge Instructions (Signed)
You have a 2 mm stone on the left that has passed into your bladder.    Drink plenty of fluids.  Take pain and nausea medications as prescribed.  Return to the ER for worsening pain, or nausea despite medications, fever, or other new concerning symptoms.  Follow up with urology as listed above. .  If you have problems with kidney stones either with your current stone or with future stones, the urology group prefers that you be seen at the [facility] ER versus [facility].  They are better equipped to handle complications at the [facility].     Kidney Stones Kidney stones (urolithiasis) are deposits that form inside your kidneys. The intense pain is caused by the stone moving through the urinary tract. When the stone moves, the ureter goes into spasm around the stone. The stone is usually passed in the urine.  CAUSES   A disorder that makes certain neck glands produce too much parathyroid hormone (primary hyperparathyroidism).  A buildup of uric acid crystals, similar to gout in your joints.  Narrowing (stricture) of the ureter.  A kidney obstruction present at birth (congenital obstruction).  Previous surgery on the kidney or ureters.  Numerous kidney infections. SYMPTOMS   Feeling sick to your stomach (nauseous).  Throwing up (vomiting).  Blood in the urine (hematuria).  Pain that usually spreads (radiates) to the groin.  Frequency or urgency of urination. DIAGNOSIS   Taking a history and physical exam.  Blood or urine tests.  CT scan.  Occasionally, an examination of the inside of the urinary bladder (cystoscopy) is performed. TREATMENT   Observation.  Increasing your fluid intake.  Extracorporeal shock wave lithotripsy--This is a noninvasive procedure that uses shock waves to break up kidney stones.  Surgery may be needed if you have severe pain or persistent obstruction. There are various surgical procedures. Most of the procedures are performed with the  use of small instruments. Only small incisions are needed to accommodate these instruments, so recovery time is minimized. The size, location, and chemical composition are all important variables that will determine the proper choice of action for you. Talk to your health care provider to better understand your situation so that you will minimize the risk of injury to yourself and your kidney.  HOME CARE INSTRUCTIONS   Drink enough water and fluids to keep your urine clear or pale yellow. This will help you to pass the stone or stone fragments.  Strain all urine through the provided strainer. Keep all particulate matter and stones for your health care provider to see. The stone causing the pain may be as small as a grain of salt. It is very important to use the strainer each and every time you pass your urine. The collection of your stone will allow your health care provider to analyze it and verify that a stone has actually passed. The stone analysis will often identify what you can do to reduce the incidence of recurrences.  Only take over-the-counter or prescription medicines for pain, discomfort, or fever as directed by your health care provider.  Make a follow-up appointment with your health care provider as directed.  Get follow-up X-rays if required. The absence of pain does not always mean that the stone has passed. It may have only stopped moving. If the urine remains completely obstructed, it can cause loss of kidney function or even complete destruction of the kidney. It is your responsibility to make sure X-rays and follow-ups are completed. Ultrasounds  of the kidney can show blockages and the status of the kidney. Ultrasounds are not associated with any radiation and can be performed easily in a matter of minutes. SEEK MEDICAL CARE IF:  You experience pain that is progressive and unresponsive to any pain medicine you have been prescribed. SEEK IMMEDIATE MEDICAL CARE IF:   Pain cannot  be controlled with the prescribed medicine.  You have a fever or shaking chills.  The severity or intensity of pain increases over 18 hours and is not relieved by pain medicine.  You develop a new onset of abdominal pain.  You feel faint or pass out.  You are unable to urinate. MAKE SURE YOU:   Understand these instructions.  Will watch your condition.  Will get help right away if you are not doing well or get worse. Document Released: 05/28/2005 Document Revised: 01/28/2013 Document Reviewed: 10/29/2012 [facility] Patient Information 2015 [place], [place]. This information is not intended to replace advice given to you by your health care provider. Make sure you discuss any questions you have with your health care provider.  Dietary Guidelines to Help Prevent Kidney Stones Your risk of kidney stones can be decreased by adjusting the foods you eat. The most important thing you can do is drink enough fluid. You should drink enough fluid to keep your urine clear or pale yellow. The following guidelines provide specific information for the type of kidney stone you have had. GUIDELINES ACCORDING TO TYPE OF KIDNEY STONE Calcium Oxalate Kidney Stones  Reduce the amount of salt you eat. Foods that have a lot of salt cause your body to release excess calcium into your urine. The excess calcium can combine with a substance called oxalate to form kidney stones.  Reduce the amount of animal protein you eat if the amount you eat is excessive. Animal protein causes your body to release excess calcium into your urine. Ask your dietitian how much protein from animal sources you should be eating.  Avoid foods that are high in oxalates. If you take vitamins, they should have less than 500 mg of vitamin C. Your body turns vitamin C into oxalates. You do not need to avoid fruits and vegetables high in vitamin C. Calcium Phosphate Kidney Stones  Reduce the amount of salt you eat to help prevent the release  of excess calcium into your urine.  Reduce the amount of animal protein you eat if the amount you eat is excessive. Animal protein causes your body to release excess calcium into your urine. Ask your dietitian how much protein from animal sources you should be eating.  Get enough calcium from food or take a calcium supplement (ask your dietitian for recommendations). Food sources of calcium that do not increase your risk of kidney stones include:  Broccoli.  Dairy products, such as cheese and yogurt.  Pudding. Uric Acid Kidney Stones  Do not have more than 6 oz of animal protein per day. FOOD SOURCES Animal Protein Sources  Meat (all types).  Poultry.  Eggs.  Fish, seafood. Foods High in [employer] seasonings.  Soy sauce.  Teriyaki sauce.  Cured and processed meats.  Salted crackers and snack foods.  Fast food.  Canned soups and most canned foods. Foods High in Oxalates  Grains:  Amaranth.  Barley.  Grits.  Wheat germ.  Bran.  Buckwheat flour.  All bran cereals.  Pretzels.  Whole wheat bread.  Vegetables:  Beans (wax).  Beets and beet greens.  Collard greens.  Eggplant.  [place].  Leeks.  Okra.  Parsley.  R[place]abagas.  Spinach.  Swiss chard.  Tomato paste.  Fried potatoes.  Sweet potatoes.  Fruits:  Red currants.  Figs.  Kiwi.  Rhubarb.  Meat and Other Protein Sources:  Beans (dried).  Soy burgers and other soybean products.  Miso.  Nuts (peanuts, almonds, pecans, cashews, hazelnuts).  Nut butters.  Sesame seeds and tahini (paste made of sesame seeds).  Poppy seeds.  Beverages:  Chocolate drink mixes.  Soy milk.  Instant iced tea.  Juices made from high-oxalate fruits or vegetables.  Other:  Carob.  Chocolate.  Fruitcake.  Marmalades. Document Released: 09/22/2010 Document Revised: 06/02/2013 Document Reviewed: 04/24/2013 [facility] Patient Information 2015 [place], [place]. This  information is not intended to replace advice given to you by your health care provider. Make sure you discuss any questions you have with your health care provider.

## 2014-12-22 NOTE — ED Notes (Signed)
Pt. Left with all belongings 

## 2014-12-23 LAB — URINE CULTURE

## 2014-12-24 ENCOUNTER — Encounter: Payer: Self-pay | Admitting: Internal Medicine

## 2014-12-24 ENCOUNTER — Ambulatory Visit: Payer: No Typology Code available for payment source | Attending: Internal Medicine | Admitting: Internal Medicine

## 2014-12-24 VITALS — BP 132/72 | HR 76 | Temp 98.2°F | Resp 20 | Ht 64.0 in | Wt 238.0 lb

## 2014-12-24 DIAGNOSIS — K047 Periapical abscess without sinus: Secondary | ICD-10-CM | POA: Diagnosis not present

## 2014-12-24 DIAGNOSIS — M25532 Pain in left wrist: Secondary | ICD-10-CM | POA: Diagnosis not present

## 2014-12-24 DIAGNOSIS — N200 Calculus of kidney: Secondary | ICD-10-CM | POA: Insufficient documentation

## 2014-12-24 DIAGNOSIS — R911 Solitary pulmonary nodule: Secondary | ICD-10-CM | POA: Insufficient documentation

## 2014-12-24 DIAGNOSIS — L301 Dyshidrosis [pompholyx]: Secondary | ICD-10-CM | POA: Diagnosis not present

## 2014-12-24 MED ORDER — TRIAMCINOLONE ACETONIDE 0.1 % EX CREA: 1.0000 "application " | TOPICAL_CREAM | Freq: Two times a day (BID) | CUTANEOUS | Status: DC

## 2014-12-24 MED ORDER — ONDANSETRON 8 MG PO TBDP: 8.0000 mg | ORAL_TABLET | Freq: Three times a day (TID) | ORAL | Status: DC | PRN

## 2014-12-24 MED ORDER — AMOXICILLIN 500 MG PO CAPS
500.0000 mg | ORAL_CAPSULE | Freq: Three times a day (TID) | ORAL | Status: DC
Start: 2014-12-24 — End: 2015-05-11

## 2014-12-24 MED ORDER — ACETAMINOPHEN-CODEINE #3 300-30 MG PO TABS: 1.0000 | ORAL_TABLET | Freq: Three times a day (TID) | ORAL | Status: DC | PRN

## 2014-12-24 MED ORDER — TAMSULOSIN HCL 0.4 MG PO CAPS: 0.4000 mg | ORAL_CAPSULE | Freq: Every day | ORAL | Status: DC

## 2014-12-24 NOTE — Progress Notes (Signed)
Patient here for ED follow up. Patient reports she has been in the hospital four times recently. Patient states the last time she was in the hospital she was told she has a kidney stone that has not passed yet which was shown on her MRI. Patient reports she has been vomiting for over a month and last emesis was yesterday. Patient reports she was also told she had a spot on her heart. Patient denies any pain today. Patient states that her left forearm has been in pain and swelling. She is concerned about this.

## 2014-12-24 NOTE — Patient Instructions (Signed)
Pulmonary Nodule A pulmonary nodule is a small, round growth of tissue in the lung. Pulmonary nodules can range in size from less than 1/5 inch (4 mm) to a little bigger than an inch (25 mm). Most pulmonary nodules are detected when imaging tests of the lung are being performed for a different problem. Pulmonary nodules are usually not cancerous (benign). However, some pulmonary nodules are cancerous (malignant). Follow-up treatment or testing is based on the size of the pulmonary nodule and your risk of getting lung cancer.  CAUSES Benign pulmonary nodules can be caused by various things. Some of the causes include:   Bacterial, fungal, or viral infections. This is usually an old infection that is no longer active, but it can sometimes be a current, active infection.  A benign mass of tissue.  Inflammation from conditions such as rheumatoid arthritis.   Abnormal blood vessels in the lungs. Malignant pulmonary nodules can result from lung cancer or from cancers that spread to the lung from other places in the body. SIGNS AND SYMPTOMS Pulmonary nodules usually do not cause symptoms. DIAGNOSIS Most often, pulmonary nodules are found incidentally when an X-ray or CT scan is performed to look for some other problem in the lung area. To help determine whether a pulmonary nodule is benign or malignant, your health care provider will take a medical history and order a variety of tests. Tests done may include:   Blood tests.  A skin test called a tuberculin test. This test is used to determine if you have been exposed to the germ that causes tuberculosis.   Chest X-rays. If possible, a new X-ray may be compared with X-rays you have had in the past.   CT scan. This test shows smaller pulmonary nodules more clearly than an X-ray.   Positron emission tomography (PET) scan. In this test, a safe amount of a radioactive substance is injected into the bloodstream. Then, the scan takes a picture of  the pulmonary nodule. The radioactive substance is eliminated from your body in your urine.   Biopsy. A tiny piece of the pulmonary nodule is removed so it can be checked under a microscope. TREATMENT  Pulmonary nodules that are benign normally do not require any treatment because they usually do not cause symptoms or breathing problems. Your health care provider may want to monitor the pulmonary nodule through follow-up CT scans. The frequency of these CT scans will vary based on the size of the nodule and the risk factors for lung cancer. For example, CT scans will need to be done more frequently if the pulmonary nodule is larger and if you have a history of smoking and a family history of cancer. Further testing or biopsies may be done if any follow-up CT scan shows that the size of the pulmonary nodule has increased. HOME CARE INSTRUCTIONS  Only take over-the-counter or prescription medicines as directed by your health care provider.  Keep all follow-up appointments with your health care provider. SEEK MEDICAL CARE IF:  You have trouble breathing when you are active.   You feel sick or unusually tired.   You do not feel like eating.   You lose weight without trying to.   You develop chills or night sweats.  SEEK IMMEDIATE MEDICAL CARE IF:  You cannot catch your breath, or you begin wheezing.   You cannot stop coughing.   You cough up blood.   You become dizzy or feel like you are going to pass out.   You   have sudden chest pain.   You have a fever or persistent symptoms for more than 2-3 days.   You have a fever and your symptoms suddenly get worse. MAKE SURE YOU:  Understand these instructions.  Will watch your condition.  Will get help right away if you are not doing well or get worse. Document Released: 03/25/2009 Document Revised: 01/28/2013 Document Reviewed: 11/17/2012 [employer] Patient Information 2015 [employer]. This information is not intended  to replace advice given to you by your health care provider. Make sure you discuss any questions you have with your health care provider.  

## 2014-12-24 NOTE — Progress Notes (Signed)
Patient ID: Olivia Schmidt, female   DOB: 07/13/1960, 54 y.o.   MRN: [id number]  CC: ED follow up   HPI: Olivia Schmidt is a 54 y.o. female here today for a follow up visit.  Patient has no past medical history. Patient reports that she has been to the ER 4 times recently here and in [place]. She reports that while in [place] she was told that she had a spot on her lungs but did not bring any records to show this finding. She was told that the spots were not cancerous but could turn to cancer. Review of charts reveal that patient was seen 3 days ago and was found to have a kidney stone that has not passed at the time. She reports that she has had nausea and vomiting for the past one month. She has been unable to pick up her prescription for Zofran due to cost of medication.  Left forearm swelling and painful. No pain today but she notes that when she is attempting to pick up something heavy the pain is worse. She denies injury or numbness/tingling.    No Known Allergies Past Medical History  Diagnosis Date  . Fibroids    Current Outpatient Prescriptions on File Prior to Visit  Medication Sig Dispense Refill  . acetaminophen (TYLENOL) 500 MG tablet Take 1,000 mg by mouth every 6 (six) hours as needed for moderate pain or headache.     Marland Kitchen aspirin-acetaminophen-caffeine (EXCEDRIN MIGRAINE) 250-250-65 MG per tablet Take 1-2 tablets by mouth every 6 (six) hours as needed for headache.     . Naproxen Sod-Diphenhydramine (ALEVE PM) 220-25 MG TABS Take 1 tablet by mouth daily as needed (pain).    . ondansetron (ZOFRAN ODT) 8 MG disintegrating tablet Take 1 tablet (8 mg total) by mouth every 8 (eight) hours as needed for nausea or vomiting. 20 tablet 0  . oxyCODONE-acetaminophen (PERCOCET/ROXICET) 5-325 MG per tablet Take 2 tablets by mouth every 4 (four) hours as needed for severe pain. 20 tablet 0   No current facility-administered medications on file prior to visit.   Family History  Problem  Relation Age of Onset  . Aneurysm Father     brain  . Hypertension Sister   . Hypertension Brother    History   Social History  . Marital Status: Married    Spouse Name: N/A  . Number of Children: N/A  . Years of Education: N/A   Occupational History  . Not on file.   Social History Main Topics  . Smoking status: Former Smoker -- 24 years    Quit date: 06/12/2003  . Smokeless tobacco: Not on file  . Alcohol Use: No  . Drug Use: No  . Sexual Activity: Yes    Birth Control/ Protection: Surgical   Other Topics Concern  . Not on file   Social History Narrative    Review of Systems: See HPI   Objective:   Filed Vitals:   12/24/14 1457  BP: 132/72  Pulse: 76  Temp: 98.2 F (36.8 C)  Resp: 20    Physical Exam  Constitutional: She is oriented to person, place, and time.  Cardiovascular: Normal rate, regular rhythm and normal heart sounds.   Pulmonary/Chest: Effort normal and breath sounds normal.  Abdominal: There is no tenderness.  Musculoskeletal: She exhibits tenderness (left wrist. Negative phalen's test).  Neurological: She is alert and oriented to person, place, and time.  Skin: Skin is warm and dry.     Lab Results  Component Value Date   WBC 7.4 12/21/2014   HGB 10.3* 12/21/2014   HCT 32.3* 12/21/2014   MCV 76.5* 12/21/2014   PLT 216 12/21/2014   Lab Results  Component Value Date   CREATININE 0.73 12/21/2014   BUN 12 12/21/2014   NA 138 12/21/2014   K 3.3* 12/21/2014   CL 104 12/21/2014   CO2 23 12/21/2014    No results found for: HGBA1C Lipid Panel  No results found for: CHOL, TRIG, HDL, CHOLHDL, VLDL, LDLCALC     Assessment and plan:   Olivia Schmidt was seen today for follow-up.  Diagnoses and all orders for this visit:  Kidney stone Orders: -     acetaminophen-codeine (TYLENOL #3) 300-30 MG per tablet; Take 1-2 tablets by mouth every 8 (eight) hours as needed for moderate pain. -     tamsulosin (FLOMAX) 0.4 MG CAPS capsule; Take 1  capsule (0.4 mg total) by mouth daily. To help pass stone -     ondansetron (ZOFRAN ODT) 8 MG disintegrating tablet; Take 1 tablet (8 mg total) by mouth every 8 (eight) hours as needed for nausea or vomiting.  Dyshidrotic eczema Orders: -     triamcinolone cream (KENALOG) 0.1 %; Apply 1 application topically 2 (two) times daily. Alternate days with Kenalog and Excipial cream  Dental infection Orders: -     amoxicillin (AMOXIL) 500 MG capsule; Take 1 capsule (500 mg total) by mouth 3 (three) times daily.  Left wrist pain Use ibuprofen for pain.   Lung nodule Per patient report. Will bring discharge papers for review. Will recheck lung nodules in 6 months with CT scan. Patient will bring hospital discharge forms on Tuesday.    Return if symptoms worsen or fail to improve.     Lance Bosch, [facility] [phone] 12/24/2014, 2:53 PM

## 2015-01-13 ENCOUNTER — Ambulatory Visit: Payer: Self-pay | Attending: Internal Medicine

## 2015-03-29 ENCOUNTER — Ambulatory Visit: Payer: Self-pay | Attending: Internal Medicine | Admitting: Internal Medicine

## 2015-03-29 ENCOUNTER — Encounter: Payer: Self-pay | Admitting: Internal Medicine

## 2015-03-29 VITALS — BP 122/79 | HR 101 | Temp 98.0°F | Resp 16 | Ht 64.0 in | Wt 232.0 lb

## 2015-03-29 DIAGNOSIS — Z87891 Personal history of nicotine dependence: Secondary | ICD-10-CM | POA: Insufficient documentation

## 2015-03-29 DIAGNOSIS — L0201 Cutaneous abscess of face: Secondary | ICD-10-CM | POA: Insufficient documentation

## 2015-03-29 DIAGNOSIS — L0291 Cutaneous abscess, unspecified: Secondary | ICD-10-CM

## 2015-03-29 MED ORDER — SULFAMETHOXAZOLE-TRIMETHOPRIM 800-160 MG PO TABS: 1.0000 | ORAL_TABLET | Freq: Two times a day (BID) | ORAL | Status: DC

## 2015-03-29 NOTE — Progress Notes (Signed)
Patient ID: Olivia Schmidt Schmidt, female   DOB: September 19, 1960, 54 y.o.   MRN: [id number]  CC: abscess  HPI: Olivia Schmidt is a 54 y.o. female here today for a follow up visit.  Patient has past medical history of fibroids. Patient is here today for a abscess under her thr right side of her chin. She states that she has had this same abscess in the past that was I&D'ed but now the area is bigger. The area is not painful, open, or draining.   Patient has No headache, No chest pain, No abdominal pain - No Nausea, No new weakness tingling or numbness, No Cough - SOB.  No Known Allergies Past Medical History  Diagnosis Date  . Fibroids    Current Outpatient Prescriptions on File Prior to Visit  Medication Sig Dispense Refill  . acetaminophen (TYLENOL) 500 MG tablet Take 1,000 mg by mouth every 6 (six) hours as needed for moderate pain or headache.     Marland Kitchen aspirin-acetaminophen-caffeine (EXCEDRIN MIGRAINE) 250-250-65 MG per tablet Take 1-2 tablets by mouth every 6 (six) hours as needed for headache.     . Naproxen Sod-Diphenhydramine (ALEVE PM) 220-25 MG TABS Take 1 tablet by mouth daily as needed (pain).    . tamsulosin (FLOMAX) 0.4 MG CAPS capsule Take 1 capsule (0.4 mg total) by mouth daily. To help pass stone 14 capsule 0  . acetaminophen-codeine (TYLENOL #3) 300-30 MG per tablet Take 1-2 tablets by mouth every 8 (eight) hours as needed for moderate pain. 60 tablet 0  . amoxicillin (AMOXIL) 500 MG capsule Take 1 capsule (500 mg total) by mouth 3 (three) times daily. 30 capsule 0  . ondansetron (ZOFRAN ODT) 8 MG disintegrating tablet Take 1 tablet (8 mg total) by mouth every 8 (eight) hours as needed for nausea or vomiting. 30 tablet 0  . oxyCODONE-acetaminophen (PERCOCET/ROXICET) 5-325 MG per tablet Take 2 tablets by mouth every 4 (four) hours as needed for severe pain. 20 tablet 0  . triamcinolone cream (KENALOG) 0.1 % Apply 1 application topically 2 (two) times daily. 45 g 1   No current  facility-administered medications on file prior to visit.   Family History  Problem Relation Age of Onset  . Aneurysm Father     brain  . Hypertension Sister   . Hypertension Brother    Social History   Social History  . Marital Status: Married    Spouse Name: N/A  . Number of Children: N/A  . Years of Education: N/A   Occupational History  . Not on file.   Social History Main Topics  . Smoking status: Former Smoker -- 24 years    Quit date: 06/12/2003  . Smokeless tobacco: Not on file  . Alcohol Use: No  . Drug Use: No  . Sexual Activity: Yes    Birth Control/ Protection: Surgical   Other Topics Concern  . Not on file   Social History Narrative    Review of Systems: Other than what is stated in HPI, all other systems are negative.   Objective:   Filed Vitals:   03/29/15 1644  BP: 122/79  Pulse: 101  Temp: 98 F (36.7 C)  Resp: 16    Physical Exam  Constitutional: She is oriented to person, place, and time.  HENT:  Head:    Abscess formation  Lymphadenopathy:    She has no cervical adenopathy.  Neurological: She is alert and oriented to person, place, and time.  Skin: Skin is warm and dry.  Lab Results  Component Value Date   WBC 7.4 12/21/2014   HGB 10.3* 12/21/2014   HCT 32.3* 12/21/2014   MCV 76.5* 12/21/2014   PLT 216 12/21/2014   Lab Results  Component Value Date   CREATININE 0.73 12/21/2014   BUN 12 12/21/2014   NA 138 12/21/2014   K 3.3* 12/21/2014   CL 104 12/21/2014   CO2 23 12/21/2014    No results found for: HGBA1C Lipid Panel  No results found for: CHOL, TRIG, HDL, CHOLHDL, VLDL, LDLCALC     Assessment and plan:   Athenia was seen today for abscess.  Diagnoses and all orders for this visit:  Abscess versus cyst -     sulfamethoxazole-trimethoprim (BACTRIM DS,SEPTRA DS) 800-160 MG tablet; Take 1 tablet by mouth 2 (two) times daily. Area is non draining or open. I have given her a script to have in the event  the area opens, becomes painful, or begins to drain.     Return if symptoms worsen or fail to improve.   Lance Bosch, [facility] [phone] 03/29/2015, 5:03 PM

## 2015-03-29 NOTE — Patient Instructions (Signed)

## 2015-03-29 NOTE — Progress Notes (Signed)
Patient complains of having an abscess under her chin on her right side Patient states she has had an I&D to it in the past but now it is back and bigger Patient has declined the flu vaccine

## 2015-05-07 ENCOUNTER — Inpatient Hospital Stay (HOSPITAL_COMMUNITY)
Admission: EM | Admit: 2015-05-07 | Discharge: 2015-05-11 | DRG: 310 | Disposition: A | Discharge status: Home / Self Care | Payer: Self-pay | Attending: Internal Medicine | Admitting: Internal Medicine

## 2015-05-07 ENCOUNTER — Emergency Department (HOSPITAL_COMMUNITY): Payer: MEDICAID

## 2015-05-07 ENCOUNTER — Encounter (HOSPITAL_COMMUNITY): Payer: Self-pay

## 2015-05-07 ENCOUNTER — Emergency Department (HOSPITAL_COMMUNITY): Payer: Self-pay

## 2015-05-07 DIAGNOSIS — J209 Acute bronchitis, unspecified: Secondary | ICD-10-CM | POA: Diagnosis present

## 2015-05-07 DIAGNOSIS — Z8249 Family history of ischemic heart disease and other diseases of the circulatory system: Secondary | ICD-10-CM

## 2015-05-07 DIAGNOSIS — R002 Palpitations: Secondary | ICD-10-CM | POA: Diagnosis present

## 2015-05-07 DIAGNOSIS — I480 Paroxysmal atrial fibrillation: Principal | ICD-10-CM | POA: Diagnosis present

## 2015-05-07 DIAGNOSIS — I4891 Unspecified atrial fibrillation: Secondary | ICD-10-CM

## 2015-05-07 DIAGNOSIS — R1110 Vomiting, unspecified: Secondary | ICD-10-CM | POA: Diagnosis present

## 2015-05-07 DIAGNOSIS — E860 Dehydration: Secondary | ICD-10-CM | POA: Diagnosis present

## 2015-05-07 DIAGNOSIS — D72819 Decreased white blood cell count, unspecified: Secondary | ICD-10-CM | POA: Diagnosis present

## 2015-05-07 DIAGNOSIS — E669 Obesity, unspecified: Secondary | ICD-10-CM | POA: Diagnosis present

## 2015-05-07 DIAGNOSIS — E0590 Thyrotoxicosis, unspecified without thyrotoxic crisis or storm: Secondary | ICD-10-CM

## 2015-05-07 DIAGNOSIS — E0500 Thyrotoxicosis with diffuse goiter without thyrotoxic crisis or storm: Secondary | ICD-10-CM | POA: Diagnosis present

## 2015-05-07 DIAGNOSIS — Z87891 Personal history of nicotine dependence: Secondary | ICD-10-CM

## 2015-05-07 DIAGNOSIS — E049 Nontoxic goiter, unspecified: Secondary | ICD-10-CM

## 2015-05-07 DIAGNOSIS — D649 Anemia, unspecified: Secondary | ICD-10-CM | POA: Diagnosis present

## 2015-05-07 DIAGNOSIS — R079 Chest pain, unspecified: Secondary | ICD-10-CM | POA: Diagnosis present

## 2015-05-07 DIAGNOSIS — D696 Thrombocytopenia, unspecified: Secondary | ICD-10-CM | POA: Diagnosis present

## 2015-05-07 DIAGNOSIS — Z79899 Other long term (current) drug therapy: Secondary | ICD-10-CM

## 2015-05-07 DIAGNOSIS — E876 Hypokalemia: Secondary | ICD-10-CM | POA: Diagnosis present

## 2015-05-07 DIAGNOSIS — D259 Leiomyoma of uterus, unspecified: Secondary | ICD-10-CM | POA: Diagnosis present

## 2015-05-07 DIAGNOSIS — I4892 Unspecified atrial flutter: Secondary | ICD-10-CM | POA: Diagnosis present

## 2015-05-07 DIAGNOSIS — Z6839 Body mass index (BMI) 39.0-39.9, adult: Secondary | ICD-10-CM

## 2015-05-07 DIAGNOSIS — R112 Nausea with vomiting, unspecified: Secondary | ICD-10-CM

## 2015-05-07 DIAGNOSIS — R1310 Dysphagia, unspecified: Secondary | ICD-10-CM | POA: Diagnosis present

## 2015-05-07 HISTORY — DX: Chest pain, unspecified: R07.9

## 2015-05-07 HISTORY — DX: Anemia, unspecified: D64.9

## 2015-05-07 HISTORY — DX: Thyrotoxicosis, unspecified without thyrotoxic crisis or storm: E05.90

## 2015-05-07 LAB — COMPREHENSIVE METABOLIC PANEL
ALT: 35 U/L (ref 14–54)
AST: 48 U/L — ABNORMAL HIGH (ref 15–41)
Albumin: 3.6 g/dL (ref 3.5–5.0)
Alkaline Phosphatase: 115 U/L (ref 38–126)
Anion gap: 8 (ref 5–15)
BUN: 11 mg/dL (ref 6–20)
CHLORIDE: 107 mmol/L (ref 101–111)
CO2: 25 mmol/L (ref 22–32)
CREATININE: 0.56 mg/dL (ref 0.44–1.00)
Calcium: 9.4 mg/dL (ref 8.9–10.3)
Glucose, Bld: 105 mg/dL — ABNORMAL HIGH (ref 65–99)
POTASSIUM: 3.5 mmol/L (ref 3.5–5.1)
Sodium: 140 mmol/L (ref 135–145)
Total Bilirubin: 0.9 mg/dL (ref 0.3–1.2)
Total Protein: 6.9 g/dL (ref 6.5–8.1)

## 2015-05-07 LAB — CBC
HCT: 32 % — ABNORMAL LOW (ref 36.0–46.0)
Hemoglobin: 10.1 g/dL — ABNORMAL LOW (ref 12.0–15.0)
MCH: 25.6 pg — ABNORMAL LOW (ref 26.0–34.0)
MCHC: 31.6 g/dL (ref 30.0–36.0)
MCV: 81 fL (ref 78.0–100.0)
PLATELETS: 151 10*3/uL (ref 150–400)
RBC: 3.95 MIL/uL (ref 3.87–5.11)
RDW: 14.4 % (ref 11.5–15.5)
WBC: 6.2 10*3/uL (ref 4.0–10.5)

## 2015-05-07 LAB — D-DIMER, QUANTITATIVE (NOT AT ARMC): D DIMER QUANT: 2.44 ug{FEU}/mL — AB (ref 0.00–0.50)

## 2015-05-07 LAB — I-STAT TROPONIN, ED: TROPONIN I, POC: 0.02 ng/mL (ref 0.00–0.08)

## 2015-05-07 LAB — ACETAMINOPHEN LEVEL

## 2015-05-07 LAB — LIPASE, BLOOD: LIPASE: 18 U/L (ref 11–51)

## 2015-05-07 LAB — TSH: TSH: 0.013 u[IU]/mL — ABNORMAL LOW (ref 0.350–4.500)

## 2015-05-07 MED ORDER — IOHEXOL 350 MG/ML SOLN
100.0000 mL | Freq: Once | INTRAVENOUS | Status: AC | PRN
  Administered 2015-05-07: 100 mL via INTRAVENOUS

## 2015-05-07 MED ORDER — ONDANSETRON HCL 4 MG/2ML IJ SOLN
INTRAMUSCULAR | Status: AC
  Administered 2015-05-07: 4 mg
  Filled 2015-05-07: qty 2

## 2015-05-07 MED ORDER — DILTIAZEM HCL 100 MG IV SOLR
INTRAVENOUS | Status: AC
  Administered 2015-05-07: 19:00:00
  Filled 2015-05-07: qty 100

## 2015-05-07 MED ORDER — ACETAMINOPHEN-CODEINE #3 300-30 MG PO TABS
1.0000 | ORAL_TABLET | Freq: Once | ORAL | Status: AC
  Administered 2015-05-07: 1 via ORAL
  Filled 2015-05-07: qty 1

## 2015-05-07 MED ORDER — SODIUM CHLORIDE 0.9 % IV BOLUS (SEPSIS)
1000.0000 mL | Freq: Once | INTRAVENOUS | Status: AC
  Administered 2015-05-07: 1000 mL via INTRAVENOUS

## 2015-05-07 MED ORDER — DILTIAZEM HCL 100 MG IV SOLR
5.0000 mg/h | Freq: Once | INTRAVENOUS | Status: AC
  Administered 2015-05-07: 15 mg/h via INTRAVENOUS
  Filled 2015-05-07: qty 100

## 2015-05-07 MED ORDER — DILTIAZEM HCL 100 MG IV SOLR
5.0000 mg/h | Freq: Once | INTRAVENOUS | Status: AC
  Administered 2015-05-07: 10 mg/h via INTRAVENOUS

## 2015-05-07 MED ORDER — METOPROLOL TARTRATE 1 MG/ML IV SOLN
5.0000 mg | Freq: Once | INTRAVENOUS | Status: AC
  Administered 2015-05-07: 5 mg via INTRAVENOUS
  Filled 2015-05-07: qty 5

## 2015-05-07 MED ORDER — ENOXAPARIN SODIUM 120 MG/0.8ML ~~LOC~~ SOLN
105.0000 mg | Freq: Once | SUBCUTANEOUS | Status: AC
  Administered 2015-05-07: 105 mg via SUBCUTANEOUS
  Filled 2015-05-07: qty 0.8

## 2015-05-07 MED ORDER — SODIUM CHLORIDE 0.9 % IV BOLUS (SEPSIS)
500.0000 mL | Freq: Once | INTRAVENOUS | Status: AC
  Administered 2015-05-07: 500 mL via INTRAVENOUS

## 2015-05-07 NOTE — ED Notes (Signed)
Pt to ct 

## 2015-05-07 NOTE — H&P (Addendum)
PCP:  Lance Bosch, NP    Referring provider [facility]   Chief Complaint:  Chest pain  HPI: Olivia Schmidt is a 54 y.o. female   has a past medical history of Fibroids.   Presented with  Chest pain in the center of her chest reporting some nausea and vomiting. Patient reports that past 1 month she hasn't been feeling well she's been having intermittent coughing productive of white phlegm and bronchitis-like symptoms. She reports occasional wheezing. She denies any fevers. No leg swelling. No travel history. She has been losing weight and has lost up to 70 pounds over past 4 months. Reports some hair loss. Been having nausea and vomiting which has been persistent for at least 2 weeks sensation of her heart beating racing.  in the emergency department she was noted to be tachycardic up to 180 was found to be in atrial fibrillation with RVR. Patient was given Cardizem IV 10 many grams 2 is at improvement started on Cardizem drip but continued to still be tachycardic she was given metoprolol 10 mg IV and bolus with total of 1.5 L. Despite this she remained tachycardic. Cardiology consult has been called and patient was recommended to be started on Lovenox for possible cardioversion tomorrow and to be admitted to stepdown unit by medicine. Emerge department she was found to have elevated elevated d-dimer 2.4 as well as low TSH at 0.013  Hospitalist was called for admission for atrial fibrillation with RVR, dehydration, hyperthyroidism  Review of Systems:    Pertinent positives include: chest pain, abdominal pain, nausea, vomiting, diarrhea , fatigue, weight loss   dizziness, palpitations. Constitutional:  No weight loss, night sweats, Fevers, chills HEENT:  No headaches, Difficulty swallowing,Tooth/dental problems,Sore throat,  No sneezing, itching, ear ache, nasal congestion, post nasal drip,  Cardio-vascular:  No Orthopnea, PND, anasarca,no Bilateral lower extremity swelling  GI:  No  heartburn, indigestion,, change in bowel habits, loss of appetite, melena, blood in stool, hematemesis Resp:  no shortness of breath at rest. No dyspnea on exertion, No excess mucus, no productive cough, No non-productive cough, No coughing up of blood.No change in color of mucus.No wheezing. Skin:  no rash or lesions. No jaundice GU:  no dysuria, change in color of urine, no urgency or frequency. No straining to urinate.  No flank pain.  Musculoskeletal:  No joint pain or no joint swelling. No decreased range of motion. No back pain.  Psych:  No change in mood or affect. No depression or anxiety. No memory loss.  Neuro: no localizing neurological complaints, no tingling, no weakness, no double vision, no gait abnormality, no slurred speech, no confusion  Otherwise ROS are negative except for above, 10 systems were reviewed  Past Medical History: Past Medical History  Diagnosis Date  . Fibroids    Past Surgical History  Procedure Laterality Date  . Abdominal hysterectomy       Medications: Prior to Admission medications   Medication Sig Start Date End Date Taking? Authorizing Provider  acetaminophen (TYLENOL) 500 MG tablet Take 1,000 mg by mouth every 6 (six) hours as needed for moderate pain or headache.    Yes Historical Provider, MD  aspirin-acetaminophen-caffeine (EXCEDRIN MIGRAINE) [phone] MG per tablet Take 1-2 tablets by mouth every 6 (six) hours as needed for headache.    Yes Historical Provider, MD  Naproxen Sod-Diphenhydramine (ALEVE PM) 220-25 MG TABS Take 2 tablets by mouth daily as needed (pain).    Yes Historical Provider, MD  triamcinolone cream (  KENALOG) 0.1 % Apply 1 application topically 2 (two) times daily. 12/24/14  Yes Lance Bosch, NP  acetaminophen-codeine (TYLENOL #3) 300-30 MG per tablet Take 1-2 tablets by mouth every 8 (eight) hours as needed for moderate pain. 12/24/14   Lance Bosch, NP  amoxicillin (AMOXIL) 500 MG capsule Take 1 capsule (500 mg  total) by mouth 3 (three) times daily. 12/24/14   Lance Bosch, NP  ondansetron (ZOFRAN ODT) 8 MG disintegrating tablet Take 1 tablet (8 mg total) by mouth every 8 (eight) hours as needed for nausea or vomiting. 12/24/14   Lance Bosch, NP  oxyCODONE-acetaminophen (PERCOCET/ROXICET) 5-325 MG per tablet Take 2 tablets by mouth every 4 (four) hours as needed for severe pain. 12/22/14   Linton Flemings, MD  sulfamethoxazole-trimethoprim (BACTRIM DS,SEPTRA DS) 800-160 MG tablet Take 1 tablet by mouth 2 (two) times daily. 03/29/15   Lance Bosch, NP  tamsulosin (FLOMAX) 0.4 MG CAPS capsule Take 1 capsule (0.4 mg total) by mouth daily. To help pass stone 12/24/14   Lance Bosch, NP    Allergies:  No Known Allergies  Social History:  Ambulatory  Independently  Lives at home  With family     reports that she quit smoking about 11 years ago. She does not have any smokeless tobacco history on file. She reports that she does not drink alcohol or use illicit drugs.    Family History: family history includes Aneurysm in her father; Hypertension in her brother and sister.    Physical Exam: Patient Vitals for the past 24 hrs:  BP Temp Temp src Pulse Resp SpO2 Weight  05/07/15 2215 134/70 mmHg - - 100 26 100 % -  05/07/15 2210 (!) 145/127 mmHg - - (!) 137 (!) 27 100 % -  05/07/15 2205 147/67 mmHg - - 97 25 100 % -  05/07/15 2155 113/93 mmHg - - 108 (!) 29 100 % -  05/07/15 2145 142/88 mmHg - - 63 (!) 28 100 % -  05/07/15 2130 128/93 mmHg - - 67 26 100 % -  05/07/15 2125 100/69 mmHg - - 115 25 99 % 105 kg (231 lb 7.7 oz)  05/07/15 2120 (!) 155/143 mmHg - - 74 22 100 % -  05/07/15 2115 136/74 mmHg - - 61 (!) 29 100 % -  05/07/15 2110 135/70 mmHg - - 62 (!) 32 100 % -  05/07/15 2105 - - - (!) 56 22 100 % -  05/07/15 2100 128/73 mmHg - - - 26 - -  05/07/15 2055 146/72 mmHg - - 82 23 93 % -  05/07/15 2050 (!) 152/114 mmHg - - 70 (!) 29 100 % -  05/07/15 2030 (!) 139/107 mmHg - - - (!) 36 - -    05/07/15 2025 125/91 mmHg - - 108 (!) 31 100 % -  05/07/15 2020 - - - (!) 166 26 100 % -  05/07/15 2015 129/84 mmHg - - 102 26 100 % -  05/07/15 1940 (!) 181/140 mmHg - - (!) 130 22 100 % -  05/07/15 1935 (!) 141/116 mmHg - - 85 23 100 % -  05/07/15 1933 119/86 mmHg - - 109 21 100 % -  05/07/15 1925 (!) 83/60 mmHg - - (!) 177 22 100 % -  05/07/15 1920 139/71 mmHg - - (!) 178 24 100 % -  05/07/15 1919 166/92 mmHg - - (!) 153 20 100 % -  05/07/15 1915 166/92 mmHg - -  96 25 100 % -  05/07/15 1906 (!) 178/145 mmHg - - (!) 131 (!) 29 99 % -  05/07/15 1903 (!) 155/111 mmHg 98.5 F (36.9 C) Oral (!) 199 (!) 30 99 % -    1. General:  in No Acute distress 2. Psychological: Alert and  Oriented 3. Head/ENT:    Dry Mucous Membranes                          Head Non traumatic, neck supple                          Normal   Dentition 4. SKIN:  decreased Skin turgor,  Skin clean Dry and intact no rash 5. Heart: irregular rate and rhythm no Murmur, Rub or gallop 6. Lungs: Clear to auscultation bilaterally, no wheezes or crackles   7. Abdomen: Soft, non-tender, Non distended 8. Lower extremities: no clubbing, cyanosis, or edema 9. Neurologically Grossly intact, moving all 4 extremities equally 10. MSK: Normal range of motion  body mass index is 39.71 kg/(m^2).   Labs on Admission:   Results for orders placed or performed during the hospital encounter of 05/07/15 (from the past 24 hour(s))  CBC     Status: Abnormal   Collection Time: 05/07/15  7:10 PM  Result Value Ref Range   WBC 6.2 4.0 - 10.5 K/uL   RBC 3.95 3.87 - 5.11 MIL/uL   Hemoglobin 10.1 (L) 12.0 - 15.0 g/dL   HCT 32.0 (L) 36.0 - 46.0 %   MCV 81.0 78.0 - 100.0 fL   MCH 25.6 (L) 26.0 - 34.0 pg   MCHC 31.6 30.0 - 36.0 g/dL   RDW 14.4 11.5 - 15.5 %   Platelets 151 150 - 400 K/uL  Comprehensive metabolic panel     Status: Abnormal   Collection Time: 05/07/15  7:10 PM  Result Value Ref Range   Sodium 140 135 - 145 mmol/L    Potassium 3.5 3.5 - 5.1 mmol/L   Chloride 107 101 - 111 mmol/L   CO2 25 22 - 32 mmol/L   Glucose, Bld 105 (H) 65 - 99 mg/dL   BUN 11 6 - 20 mg/dL   Creatinine, Ser 0.56 0.44 - 1.00 mg/dL   Calcium 9.4 8.9 - 10.3 mg/dL   Total Protein 6.9 6.5 - 8.1 g/dL   Albumin 3.6 3.5 - 5.0 g/dL   AST 48 (H) 15 - 41 U/L   ALT 35 14 - 54 U/L   Alkaline Phosphatase 115 38 - 126 U/L   Total Bilirubin 0.9 0.3 - 1.2 mg/dL   GFR calc non Af Amer >60 >60 mL/min   GFR calc Af Amer >60 >60 mL/min   Anion gap 8 5 - 15  Lipase, blood     Status: None   Collection Time: 05/07/15  7:10 PM  Result Value Ref Range   Lipase 18 11 - 51 U/L  TSH     Status: Abnormal   Collection Time: 05/07/15  7:10 PM  Result Value Ref Range   TSH 0.013 (L) 0.350 - 4.500 uIU/mL  I-stat troponin, ED     Status: None   Collection Time: 05/07/15  7:50 PM  Result Value Ref Range   Troponin i, poc 0.02 0.00 - 0.08 ng/mL   Comment 3          Acetaminophen level     Status: Abnormal  Collection Time: 05/07/15  8:11 PM  Result Value Ref Range   Acetaminophen (Tylenol), Serum <10 (L) 10 - 30 ug/mL  D-dimer, quantitative     Status: Abnormal   Collection Time: 05/07/15  9:44 PM  Result Value Ref Range   D-Dimer, Quant 2.44 (H) 0.00 - 0.50 ug/mL-FEU    UA pending  No results found for: HGBA1C  Estimated Creatinine Clearance: 98.2 mL/min (by C-G formula based on Cr of 0.56).  BNP (last 3 results) No results for input(s): PROBNP in the last 8760 hours.  Other results:  I have pearsonaly reviewed this: ECG REPORT  Rate:181  Rhythm: A. fib with RVR ST&T Change: Rate-related T wave depression QTC within normal limits  Filed Weights   05/07/15 2125  Weight: 105 kg (231 lb 7.7 oz)     Cultures:    Component Value Date/Time   SDES URINE, CLEAN CATCH 12/22/2014 0031   SPECREQUEST NONE 12/22/2014 0031   CULT  12/22/2014 0031    MULTIPLE SPECIES PRESENT, SUGGEST RECOLLECTION IF CLINICALLY INDICATED   REPTSTATUS  12/23/2014 FINAL 12/22/2014 0031     Radiological Exams on Admission: Ct Angio Chest Pe W/cm &/or Wo Cm  05/07/2015  CLINICAL DATA:  Central chest pain. Vomiting. Tachycardia. Elevated D-dimer. Shortness of breath. EXAM: CT ANGIOGRAPHY CHEST WITH CONTRAST TECHNIQUE: Multidetector CT imaging of the chest was performed using the standard protocol during bolus administration of intravenous contrast. Multiplanar CT image reconstructions and MIPs were obtained to evaluate the vascular anatomy. CONTRAST:  144mL OMNIPAQUE IOHEXOL 350 MG/ML SOLN COMPARISON:  07/06/2013 FINDINGS: Technically adequate study with moderately good opacification of the central and proximal segmental pulmonary arteries. Limited opacification of the distal pulmonary arteries. No filling defects demonstrated. No evidence of significant pulmonary embolus. Since the previous study, there has been interval development of small pericardial effusion. Increased density in the anterior mediastinum may represent thymic tissue. Normal heart size. Normal caliber thoracic aorta. No significant lymphadenopathy in the chest. Esophagus is decompressed. Evaluation of lungs is limited due to respiratory motion artifact. No focal consolidation or airspace disease. Small focal ground-glass nodular infiltrate in the left apex measuring about 1 cm diameter was not present on the previous study and may represent inflammatory focus. Follow-up examination in 3 months is suggested. No pleural effusions. No pneumothorax. Included portions of the upper abdominal organs are grossly unremarkable. Degenerative changes in the spine. No destructive bone lesions. Prominent degenerative changes at the sternomanubrial joint. Review of the MIP images confirms the above findings. IMPRESSION: No evidence of significant pulmonary embolus. Peripheral pulmonary arteries are not well opacified, limiting evaluation. 1 cm ground-glass nodular infiltrate demonstrated in the left apex,  not present on previous study. This could represent a small inflammatory focus. Follow-up examination in 3 months is suggested. Electronically Signed   By: Lucienne Capers M.D.   On: 05/07/2015 22:49   Dg Chest Port 1 View  05/07/2015  CLINICAL DATA:  Central chest pain for 1 week, vomiting for 1 month EXAM: PORTABLE CHEST 1 VIEW COMPARISON:  07/06/2013 FINDINGS: Limited AP portable radiograph. Mild cardiac enlargement stable. Vascular pattern normal. Right lung appears to be clear. Difficult to evaluate retrocardiac portion of left lower lobe of left lung appears clear as well. IMPRESSION: Limited study showing no acute abnormality Electronically Signed   By: Skipper Cliche M.D.   On: 05/07/2015 19:37   Ct Renal Stone Study  05/07/2015  CLINICAL DATA:  Abdominal pain, vomiting, history of kidney stone EXAM: CT ABDOMEN AND PELVIS  WITHOUT CONTRAST TECHNIQUE: Multidetector CT imaging of the abdomen and pelvis was performed following the standard protocol without IV contrast. COMPARISON:  12/22/2014 FINDINGS: Lung bases are unremarkable. There are streak artifacts from patient's large body habitus. Motion artifacts are noted. Unenhanced liver shows no biliary ductal dilatation. No calcified gallstones are noted within gallbladder. Unenhanced pancreas, spleen and adrenal glands are unremarkable. Unenhanced kidneys are symmetrical in size. There is no nephrolithiasis. No small bowel obstruction. No ascites or free air. No adenopathy. No pericecal inflammation. Normal appendix partially visualized in axial image 54. No calcified ureteral calculi. There is a right iliac chain lymph node axial image 49 measures 1.2 by 0.8 cm probable reactive. No distal colonic obstruction. The patient is status post hysterectomy. The urinary bladder is under distended. Again noted bilateral inguinal lymph nodes. The largest right inguinal lymph node measures 1.6 by 1.5 cm. The largest left inguinal lymph node measures 1.6 by 1.4  cm. No destructive bony lesions are noted within pelvis. Sagittal images of the spine shows mild degenerative changes thoracolumbar spine. Right pelvic sidewall lymph node measures 1.8 x 1.3 cm. Left pelvic sidewall lymph node measures 1 by 1.4 cm. IMPRESSION: 1. There is no nephrolithiasis.  No hydronephrosis or hydroureter. 2. No pericecal inflammation.  Normal appendix. 3. No calcified ureteral calculi. 4. No small bowel obstruction. 5. Mild enlarged pelvic and bilateral inguinal lymph nodes. This may be reactive or due to lymphoproliferative disease. Electronically Signed   By: Lahoma Crocker M.D.   On: 05/07/2015 20:25    Chart has been reviewed  Family  at  Bedside  plan of care was discussed with  Tommi Emery [id number]  Husband not at bedside Delight Stare [id number] Assessment/Plan  54 year old female presents with nausea vomiting palpitations chest pain was found to be in atrial fibrillation with RVR as well as evidence of hyperthyroidism  Present on Admission:  . Atrial fibrillation with RVR ([place]) admit to step down on diltiazem drip will rehydrate. Continue Lovenox. Cardiology is aware will see patient in a.m. We'll make nothing by mouth for possible cardioversion. Obtain echogram  . Chest pain will cycle cardiac enzymes monitor on telemetry obtain serial EKG  . Anemia obtain an anemia panel Hemoccult stool  . Hyperthyroidism check T3 and T4 levels. Obtain ultrasound of thyroid, will need referral to endocrinology, once stable will need to initiate beta blocker Cough  -no evidence of infiltrate on CT. Possible reactive airway contributing factors. We'll make sure she is on Xopenex as needed avoid albuterol given tachycardia.  We'll treat with mucinex Prophylaxis:  Lovenox   CODE STATUS:  FULL CODE as per patient   Disposition:   To home once workup is complete and patient is stable  Other plan as per orders.  I have spent a total of 55 min on this  admission  Tahani Potier 05/07/2015, 10:43 PM  [facility]  Pager [phone]   after 2 AM please page floor coverage PA If 7AM-7PM, please contact the day team taking care of the patient  [email or web address]  Password [email or web address]

## 2015-05-07 NOTE — ED Notes (Signed)
Pt states she had a cough x one week prior to her symptoms of nausea and chest pain.

## 2015-05-07 NOTE — ED Notes (Signed)
2nd cardizem bolus complete.

## 2015-05-07 NOTE — Consult Note (Signed)
Reason for Consult: atrial fibrillation with RVR Primary Cardiologist: new Referring Physician: Dr. Aida Raider E Minor is an 54 y.o. female.  HPI: [name] is a 54 yo woman with limited PMH (uterine fibroids) who has been feeling unwell for several weeks to a month and developed palpitations and fast heart rate since Thanksgiving day ultimately leading to her to present to the ER. She says that she has had nausea/vomiting with any eating for approximately 1 month. She also notes left-sided chest pain that has been largely constant for several weeks along with coughing and dyspnea. Of note she's been taking hydroxycut for several months quitting ~ 1 month ago for side effects; however, she has been able to lose approximately 60-70 lbs in the last 3-4 months. In the ER she was started on diltiazem, given IV fluids and noted to have TSH of 0.013.   Past Medical History  Diagnosis Date  . Fibroids     Past Surgical History  Procedure Laterality Date  . Abdominal hysterectomy      Family History  Problem Relation Age of Onset  . Aneurysm Father     brain  . Hypertension Sister   . Hypertension Brother     Social History:  reports that she quit smoking about 11 years ago. She does not have any smokeless tobacco history on file. She reports that she does not drink alcohol or use illicit drugs.  Allergies: No Known Allergies  Medications:  I have reviewed the patient's current medications. Prior to Admission:  Prescriptions prior to admission  Medication Sig Dispense Refill Last Dose  . acetaminophen (TYLENOL) 500 MG tablet Take 1,000 mg by mouth every 6 (six) hours as needed for moderate pain or headache.    unknown  . aspirin-acetaminophen-caffeine (EXCEDRIN MIGRAINE) 250-250-65 MG per tablet Take 1-2 tablets by mouth every 6 (six) hours as needed for headache.    unknown  . Naproxen Sod-Diphenhydramine (ALEVE PM) 220-25 MG TABS Take 2 tablets by mouth daily as needed (pain).     05/06/2015  . triamcinolone cream (KENALOG) 0.1 % Apply 1 application topically 2 (two) times daily. 45 g 1 05/06/2015 at Unknown time  . acetaminophen-codeine (TYLENOL #3) 300-30 MG per tablet Take 1-2 tablets by mouth every 8 (eight) hours as needed for moderate pain. 60 tablet 0   . amoxicillin (AMOXIL) 500 MG capsule Take 1 capsule (500 mg total) by mouth 3 (three) times daily. 30 capsule 0   . ondansetron (ZOFRAN ODT) 8 MG disintegrating tablet Take 1 tablet (8 mg total) by mouth every 8 (eight) hours as needed for nausea or vomiting. 30 tablet 0   . oxyCODONE-acetaminophen (PERCOCET/ROXICET) 5-325 MG per tablet Take 2 tablets by mouth every 4 (four) hours as needed for severe pain. 20 tablet 0 Taking  . sulfamethoxazole-trimethoprim (BACTRIM DS,SEPTRA DS) 800-160 MG tablet Take 1 tablet by mouth 2 (two) times daily. 14 tablet 0   . tamsulosin (FLOMAX) 0.4 MG CAPS capsule Take 1 capsule (0.4 mg total) by mouth daily. To help pass stone 14 capsule 0 Taking   Scheduled: . aspirin EC  81 mg Oral Daily  . diltiazem (CARDIZEM) infusion  5-15 mg/hr Intravenous Once  . guaiFENesin  600 mg Oral BID  . ipratropium  0.5 mg Nebulization Q6H  . sodium chloride  3 mL Intravenous Q12H    Results for orders placed or performed during the hospital encounter of 05/07/15 (from the past 48 hour(s))  CBC     Status:  Abnormal   Collection Time: 05/07/15  7:10 PM  Result Value Ref Range   WBC 6.2 4.0 - 10.5 K/uL   RBC 3.95 3.87 - 5.11 MIL/uL   Hemoglobin 10.1 (L) 12.0 - 15.0 g/dL   HCT 32.0 (L) 36.0 - 46.0 %   MCV 81.0 78.0 - 100.0 fL   MCH 25.6 (L) 26.0 - 34.0 pg   MCHC 31.6 30.0 - 36.0 g/dL   RDW 14.4 11.5 - 15.5 %   Platelets 151 150 - 400 K/uL  Comprehensive metabolic panel     Status: Abnormal   Collection Time: 05/07/15  7:10 PM  Result Value Ref Range   Sodium 140 135 - 145 mmol/L   Potassium 3.5 3.5 - 5.1 mmol/L   Chloride 107 101 - 111 mmol/L   CO2 25 22 - 32 mmol/L   Glucose, Bld 105  (H) 65 - 99 mg/dL   BUN 11 6 - 20 mg/dL   Creatinine, Ser 0.56 0.44 - 1.00 mg/dL   Calcium 9.4 8.9 - 10.3 mg/dL   Total Protein 6.9 6.5 - 8.1 g/dL   Albumin 3.6 3.5 - 5.0 g/dL   AST 48 (H) 15 - 41 U/L   ALT 35 14 - 54 U/L   Alkaline Phosphatase 115 38 - 126 U/L   Total Bilirubin 0.9 0.3 - 1.2 mg/dL   GFR calc non Af Amer >60 >60 mL/min   GFR calc Af Amer >60 >60 mL/min    Comment: (NOTE) The eGFR has been calculated using the CKD EPI equation. This calculation has not been validated in all clinical situations. eGFR's persistently <60 mL/min signify possible Chronic Kidney Disease.    Anion gap 8 5 - 15  Lipase, blood     Status: None   Collection Time: 05/07/15  7:10 PM  Result Value Ref Range   Lipase 18 11 - 51 U/L  I-stat troponin, ED     Status: None   Collection Time: 05/07/15  7:50 PM  Result Value Ref Range   Troponin i, poc 0.02 0.00 - 0.08 ng/mL   Comment 3            Comment: Due to the release kinetics of cTnI, a negative result within the first hours of the onset of symptoms does not rule out myocardial infarction with certainty. If myocardial infarction is still suspected, repeat the test at appropriate intervals.   Acetaminophen level     Status: Abnormal   Collection Time: 05/07/15  8:11 PM  Result Value Ref Range   Acetaminophen (Tylenol), Serum <10 (L) 10 - 30 ug/mL    Comment:        THERAPEUTIC CONCENTRATIONS VARY SIGNIFICANTLY. A RANGE OF 10-30 ug/mL MAY BE AN EFFECTIVE CONCENTRATION FOR MANY PATIENTS. HOWEVER, SOME ARE BEST TREATED AT CONCENTRATIONS OUTSIDE THIS RANGE. ACETAMINOPHEN CONCENTRATIONS >150 ug/mL AT 4 HOURS AFTER INGESTION AND >50 ug/mL AT 12 HOURS AFTER INGESTION ARE OFTEN ASSOCIATED WITH TOXIC REACTIONS.     Dg Chest Port 1 View  05/07/2015  CLINICAL DATA:  Central chest pain for 1 week, vomiting for 1 month EXAM: PORTABLE CHEST 1 VIEW COMPARISON:  07/06/2013 FINDINGS: Limited AP portable radiograph. Mild cardiac enlargement  stable. Vascular pattern normal. Right lung appears to be clear. Difficult to evaluate retrocardiac portion of left lower lobe of left lung appears clear as well. IMPRESSION: Limited study showing no acute abnormality Electronically Signed   By: Skipper Cliche M.D.   On: 05/07/2015 19:37   Ct Renal  Stone Study  05/07/2015  CLINICAL DATA:  Abdominal pain, vomiting, history of kidney stone EXAM: CT ABDOMEN AND PELVIS WITHOUT CONTRAST TECHNIQUE: Multidetector CT imaging of the abdomen and pelvis was performed following the standard protocol without IV contrast. COMPARISON:  12/22/2014 FINDINGS: Lung bases are unremarkable. There are streak artifacts from patient's large body habitus. Motion artifacts are noted. Unenhanced liver shows no biliary ductal dilatation. No calcified gallstones are noted within gallbladder. Unenhanced pancreas, spleen and adrenal glands are unremarkable. Unenhanced kidneys are symmetrical in size. There is no nephrolithiasis. No small bowel obstruction. No ascites or free air. No adenopathy. No pericecal inflammation. Normal appendix partially visualized in axial image 54. No calcified ureteral calculi. There is a right iliac chain lymph node axial image 49 measures 1.2 by 0.8 cm probable reactive. No distal colonic obstruction. The patient is status post hysterectomy. The urinary bladder is under distended. Again noted bilateral inguinal lymph nodes. The largest right inguinal lymph node measures 1.6 by 1.5 cm. The largest left inguinal lymph node measures 1.6 by 1.4 cm. No destructive bony lesions are noted within pelvis. Sagittal images of the spine shows mild degenerative changes thoracolumbar spine. Right pelvic sidewall lymph node measures 1.8 x 1.3 cm. Left pelvic sidewall lymph node measures 1 by 1.4 cm. IMPRESSION: 1. There is no nephrolithiasis.  No hydronephrosis or hydroureter. 2. No pericecal inflammation.  Normal appendix. 3. No calcified ureteral calculi. 4. No small bowel  obstruction. 5. Mild enlarged pelvic and bilateral inguinal lymph nodes. This may be reactive or due to lymphoproliferative disease. Electronically Signed   By: Lahoma Crocker M.D.   On: 05/07/2015 20:25    Review of Systems  Constitutional: Positive for weight loss.       Weight loss  HENT: Negative for ear discharge and ear pain.   Eyes: Negative for photophobia and pain.  Respiratory: Positive for cough and shortness of breath.   Cardiovascular: Positive for palpitations.  Gastrointestinal: Positive for nausea and vomiting.  Genitourinary: Negative for urgency, frequency and hematuria.  Musculoskeletal: Negative for myalgias and neck pain.  Neurological: Positive for weakness and headaches. Negative for tingling, tremors and sensory change.  Psychiatric/Behavioral: Negative for suicidal ideas, hallucinations and substance abuse.   Blood pressure 128/73, pulse 56, temperature 98.5 F (36.9 C), temperature source Oral, resp. rate 22, last menstrual period 09/14/2010, SpO2 100 %. Physical Exam  Nursing note and vitals reviewed. Constitutional: She is oriented to person, place, and time. She appears well-developed and well-nourished. She appears distressed.  Mildly uncomfortable   HENT:  Head: Normocephalic and atraumatic.  Nose: Nose normal.  Mouth/Throat: No oropharyngeal exudate.  Eyes: Conjunctivae and EOM are normal. Pupils are equal, round, and reactive to light. No scleral icterus.  Neck: Normal range of motion. Neck supple. JVD present. No tracheal deviation present.  JVD midneck  Cardiovascular: Intact distal pulses.  Exam reveals no gallop.   No murmur heard. Irregularly irregular, RV heave noted  Respiratory: Effort normal and breath sounds normal. No respiratory distress. She has no wheezes. She has no rales.  GI: Soft. Bowel sounds are normal. She exhibits no distension. There is no tenderness. There is no rebound.  Musculoskeletal: She exhibits edema. She exhibits no  tenderness.  Neurological: She is alert and oriented to person, place, and time.  Skin: Skin is warm and dry. No rash noted. She is not diaphoretic. No erythema.  Psychiatric: She has a normal mood and affect. Her behavior is normal. Judgment and thought content normal.  Labs reviewed; K 3.5, cr 0.56, Trop 0.02 TSH 0.013 EKG: atrial fibrillation with RVR Chest x-ray: no acute process  Assessment/Plan: 54 yo woman with PMH of uterine fibroids and hysterectomy who has been feeling unwell for several weeks to months, 4 months of 60-70 lb intentional weight loss assisted with hydroxycut (quit 1 month ago), 2 weeks of chest pain and 2 days of palpitations who is found to have atrial fibrillation with RVR.  Differential diagnosis is idiopathic, ischemia, heart failure, drugs, thyroid disease, infection among other etiologies. Given feeling unwell and use of hydroxycut, etiology may be multifactorial. For now, diltiazem gtt, observe on telemetry, start anticoagulation with lovenox for potential TEE-DCCV Monday when things are settled out. Furthermore, would explore infectious, toxic, drug, thyroid etiologies as well. Echo in AM to assess for any structural issues. Doubt PE given normal oxygen saturation but will start with d-dimer. CTA negative for PE. Neck veins looks up, RV heave noted and TSH 0.013. She also has 2+ edema bilaterally. Hyperthyroidism could be driving her symptoms and atrial fibrillation. She does not quite look like thyroid storm but will be cautious. Agree with endocrine workup in AM and will follow-up echocardiogram closely.  Problem List Atrial fibrillation with RVR Chest Pain Weight loss Pulmonary nodule/infiltrate - follow-up in 3 months Hyperhthyroidism - make sure she's received aspirin given chest pain - telemetry, trend cardiac markers, diltiazem gtt - Defer TEE-DCCV until thyroid and other issues stabilized  - echocardiogram in AM - hba1c, troponins, TSH, urinalysis,  lipid panel - explore all infectious etiologies  - start with d-dimer --> CTA negative for PE - follow-up T3/T4 and consult endocrinology   Henrique Parekh 05/07/2015, 9:28 PM

## 2015-05-07 NOTE — ED Provider Notes (Signed)
CSN: [id number]     Arrival date & time 05/07/15  1842 History   First MD Initiated Contact with Patient 05/07/15 1857     Chief Complaint  Patient presents with  . Chest Pain  . Emesis     (Consider location/radiation/quality/duration/timing/severity/associated sxs/prior Treatment) HPI Comments: Patient presents with vomiting and palpitations. She has a history of uterine fibroids and she had a possible kidney stone in July. She states she's been feeling bad for about a month. She states she's had vomiting anytime she eats for about a month. She's had some ongoing abdominal pain is well. She states that she's been very like her heart racing intermittently for about 2-3 weeks. She states since Thanksgiving which was 2 days ago, she spell it it's been racing constantly. She's had associated vomiting. She's had left-sided chest pain which is also been constant for about 2 weeks. She's had some coughing and shortness of breath as well. She denies any alcohol or drug use. She denies any past cardiac history. She denies any leg swelling. She denies any known fevers. She's had a history of a hysterectomy but no other surgical history.  She has been taking hydroxycut for several months, but stopped about a month ago due to what she felt for side effects.  Has lost about 60-70 pounds over the last 4 months.  Patient is a 54 y.o. female presenting with chest pain and vomiting.  Chest Pain Associated symptoms: abdominal pain, cough, fatigue, nausea, palpitations, shortness of breath and vomiting   Associated symptoms: no back pain, no diaphoresis, no dizziness, no fever, no headache, no numbness and no weakness   Emesis Associated symptoms: abdominal pain   Associated symptoms: no arthralgias, no chills, no diarrhea and no headaches     Past Medical History  Diagnosis Date  . Fibroids    Past Surgical History  Procedure Laterality Date  . Abdominal hysterectomy     Family History  Problem  Relation Age of Onset  . Aneurysm Father     brain  . Hypertension Sister   . Hypertension Brother    Social History  Substance Use Topics  . Smoking status: Former Smoker -- 24 years    Quit date: 06/12/2003  . Smokeless tobacco: None  . Alcohol Use: No   OB History    Gravida Para Term Preterm AB TAB SAB Ectopic Multiple Living   1    1          Review of Systems  Constitutional: Positive for appetite change, fatigue and unexpected weight change. Negative for fever, chills and diaphoresis.  HENT: Negative for congestion, rhinorrhea and sneezing.   Eyes: Negative.   Respiratory: Positive for cough and shortness of breath. Negative for chest tightness.   Cardiovascular: Positive for chest pain and palpitations. Negative for leg swelling.  Gastrointestinal: Positive for nausea, vomiting and abdominal pain. Negative for diarrhea and blood in stool.  Genitourinary: Negative for frequency, hematuria, flank pain and difficulty urinating.  Musculoskeletal: Negative for back pain and arthralgias.  Skin: Negative for rash.  Neurological: Negative for dizziness, speech difficulty, weakness, numbness and headaches.      Allergies  Review of patient's allergies indicates no known allergies.  Home Medications   Prior to Admission medications   Medication Sig Start Date End Date Taking? Authorizing Provider  acetaminophen (TYLENOL) 500 MG tablet Take 1,000 mg by mouth every 6 (six) hours as needed for moderate pain or headache.    Yes Historical Provider, MD  aspirin-acetaminophen-caffeine (EXCEDRIN MIGRAINE) 250-250-65 MG per tablet Take 1-2 tablets by mouth every 6 (six) hours as needed for headache.    Yes Historical Provider, MD  Naproxen Sod-Diphenhydramine (ALEVE PM) 220-25 MG TABS Take 2 tablets by mouth daily as needed (pain).    Yes Historical Provider, MD  triamcinolone cream (KENALOG) 0.1 % Apply 1 application topically 2 (two) times daily. 12/24/14  Yes Lance Bosch, NP   acetaminophen-codeine (TYLENOL #3) 300-30 MG per tablet Take 1-2 tablets by mouth every 8 (eight) hours as needed for moderate pain. 12/24/14   Lance Bosch, NP  amoxicillin (AMOXIL) 500 MG capsule Take 1 capsule (500 mg total) by mouth 3 (three) times daily. 12/24/14   Lance Bosch, NP  ondansetron (ZOFRAN ODT) 8 MG disintegrating tablet Take 1 tablet (8 mg total) by mouth every 8 (eight) hours as needed for nausea or vomiting. 12/24/14   Lance Bosch, NP  oxyCODONE-acetaminophen (PERCOCET/ROXICET) 5-325 MG per tablet Take 2 tablets by mouth every 4 (four) hours as needed for severe pain. 12/22/14   Linton Flemings, MD  sulfamethoxazole-trimethoprim (BACTRIM DS,SEPTRA DS) 800-160 MG tablet Take 1 tablet by mouth 2 (two) times daily. 03/29/15   Lance Bosch, NP  tamsulosin (FLOMAX) 0.4 MG CAPS capsule Take 1 capsule (0.4 mg total) by mouth daily. To help pass stone 12/24/14   Lance Bosch, NP   BP 128/73 mmHg  Pulse 56  Temp(Src) 98.5 F (36.9 C) (Oral)  Resp 22  SpO2 100%  LMP 09/14/2010 Physical Exam  Constitutional: She is oriented to person, place, and time. She appears well-developed and well-nourished. She appears distressed.  Patient is coughing and gagging.  HENT:  Head: Normocephalic and atraumatic.  Eyes: Pupils are equal, round, and reactive to light.  Neck: Normal range of motion. Neck supple.  Cardiovascular: Normal heart sounds.  An irregularly irregular rhythm present. Tachycardia present.   Pulmonary/Chest: Effort normal and breath sounds normal. No respiratory distress. She has no wheezes. She has no rales. She exhibits no tenderness.  Abdominal: Soft. Bowel sounds are normal. There is tenderness (Moderate diffuse tenderness). There is no rebound and no guarding.  Musculoskeletal: Normal range of motion. She exhibits edema (trace bilateral edema).  Lymphadenopathy:    She has no cervical adenopathy.  Neurological: She is alert and oriented to person, place, and time.   Skin: Skin is warm and dry. No rash noted.  Psychiatric: She has a normal mood and affect.    ED Course  Procedures (including critical care time) Labs Review Labs Reviewed  CBC - Abnormal; Notable for the following:    Hemoglobin 10.1 (*)    HCT 32.0 (*)    MCH 25.6 (*)    All other components within normal limits  COMPREHENSIVE METABOLIC PANEL - Abnormal; Notable for the following:    Glucose, Bld 105 (*)    AST 48 (*)    All other components within normal limits  ACETAMINOPHEN LEVEL - Abnormal; Notable for the following:    Acetaminophen (Tylenol), Serum <10 (*)    All other components within normal limits  LIPASE, BLOOD  URINE RAPID DRUG SCREEN, HOSP PERFORMED  URINALYSIS, ROUTINE W REFLEX MICROSCOPIC (NOT AT [facility])  TSH  Randolm Idol, ED    Imaging Review Dg Chest Port 1 View  05/07/2015  CLINICAL DATA:  Central chest pain for 1 week, vomiting for 1 month EXAM: PORTABLE CHEST 1 VIEW COMPARISON:  07/06/2013 FINDINGS: Limited AP portable radiograph. Mild cardiac enlargement stable.  Vascular pattern normal. Right lung appears to be clear. Difficult to evaluate retrocardiac portion of left lower lobe of left lung appears clear as well. IMPRESSION: Limited study showing no acute abnormality Electronically Signed   By: Skipper Cliche M.D.   On: 05/07/2015 19:37   Ct Renal Stone Study  05/07/2015  CLINICAL DATA:  Abdominal pain, vomiting, history of kidney stone EXAM: CT ABDOMEN AND PELVIS WITHOUT CONTRAST TECHNIQUE: Multidetector CT imaging of the abdomen and pelvis was performed following the standard protocol without IV contrast. COMPARISON:  12/22/2014 FINDINGS: Lung bases are unremarkable. There are streak artifacts from patient's large body habitus. Motion artifacts are noted. Unenhanced liver shows no biliary ductal dilatation. No calcified gallstones are noted within gallbladder. Unenhanced pancreas, spleen and adrenal glands are unremarkable. Unenhanced kidneys are  symmetrical in size. There is no nephrolithiasis. No small bowel obstruction. No ascites or free air. No adenopathy. No pericecal inflammation. Normal appendix partially visualized in axial image 54. No calcified ureteral calculi. There is a right iliac chain lymph node axial image 49 measures 1.2 by 0.8 cm probable reactive. No distal colonic obstruction. The patient is status post hysterectomy. The urinary bladder is under distended. Again noted bilateral inguinal lymph nodes. The largest right inguinal lymph node measures 1.6 by 1.5 cm. The largest left inguinal lymph node measures 1.6 by 1.4 cm. No destructive bony lesions are noted within pelvis. Sagittal images of the spine shows mild degenerative changes thoracolumbar spine. Right pelvic sidewall lymph node measures 1.8 x 1.3 cm. Left pelvic sidewall lymph node measures 1 by 1.4 cm. IMPRESSION: 1. There is no nephrolithiasis.  No hydronephrosis or hydroureter. 2. No pericecal inflammation.  Normal appendix. 3. No calcified ureteral calculi. 4. No small bowel obstruction. 5. Mild enlarged pelvic and bilateral inguinal lymph nodes. This may be reactive or due to lymphoproliferative disease. Electronically Signed   By: Lahoma Crocker M.D.   On: 05/07/2015 20:25   I have personally reviewed and evaluated these images and lab results as part of my medical decision-making.   EKG Interpretation None      MDM   Final diagnoses:  None    Patient presents in the atrial fibrillation with RVR. She also has had prolonged vomiting and bronchitis-like symptoms. She was started on IV fluids as well as Cardizem. She was given a total of 20 mg of Cardizem bolus and started on a drip which is titrated up to 15 mg per hour. Her heart rate was still in the 150s to 160s with that. She was then given a dose of Lopressor. Her heart rate has improved to about 120. She is feeling a little bit better. She still has a nagging cough. Her chest x-ray does not show an obvious  pneumonia. Her other labs are unremarkable other than low TSH which could be etiology of her symptoms. She has fairly diffuse abdominal pain. CT scan does not show any acute abnormalities. There is no evidence of obstruction or kidney stone.  I discussed the patient with Dr. Claiborne Billings with cardiology. She is okay with her heart rate in the 110s to 120s. He is advised to go ahead and start her on Lovenox in the event that she may need cardioversion. He also suggested checking a d-dimer which I did. This was positive and a follow-up chest CT did not reveal any evidence of pulmonary embolus. I spoke with Dr. Roel Cluck who will admit the patient. Cardiology to follow.  CRITICAL CARE Performed by: Jarrick Fjeld Total critical  care time: 45 minutes Critical care time was exclusive of separately billable procedures and treating other patients. Critical care was necessary to treat or prevent imminent or life-threatening deterioration. Critical care was time spent personally by me on the following activities: development of treatment plan with patient and/or surrogate as well as nursing, discussions with consultants, evaluation of patient's response to treatment, examination of patient, obtaining history from patient or surrogate, ordering and performing treatments and interventions, ordering and review of laboratory studies, ordering and review of radiographic studies, pulse oximetry and re-evaluation of patient's condition.    Malvin Johns, MD 05/07/15 2350

## 2015-05-07 NOTE — ED Notes (Signed)
Pt presents with c/o chest pain in the center of her chest. Pt reports she has also been vomiting. Pt is very tachycardic in triage, 180bpm.

## 2015-05-07 NOTE — ED Notes (Signed)
BOLUS COMPLETE

## 2015-05-07 NOTE — ED Notes (Signed)
VERBAL from [facility] another 10 mg bolus of cardizem at present time.

## 2015-05-07 NOTE — ED Notes (Signed)
Pt reports emesis for a month and "just not feeling well." Pt continues to reports chest pain for 2 weeks and heart racing for a week.

## 2015-05-08 ENCOUNTER — Encounter (HOSPITAL_COMMUNITY): Payer: Self-pay | Admitting: *Deleted

## 2015-05-08 ENCOUNTER — Inpatient Hospital Stay (HOSPITAL_COMMUNITY): Payer: Self-pay

## 2015-05-08 DIAGNOSIS — E876 Hypokalemia: Secondary | ICD-10-CM

## 2015-05-08 DIAGNOSIS — J209 Acute bronchitis, unspecified: Secondary | ICD-10-CM

## 2015-05-08 DIAGNOSIS — R1110 Vomiting, unspecified: Secondary | ICD-10-CM | POA: Diagnosis present

## 2015-05-08 DIAGNOSIS — D649 Anemia, unspecified: Secondary | ICD-10-CM

## 2015-05-08 DIAGNOSIS — I4891 Unspecified atrial fibrillation: Secondary | ICD-10-CM

## 2015-05-08 HISTORY — DX: Hypokalemia: E87.6

## 2015-05-08 HISTORY — DX: Acute bronchitis, unspecified: J20.9

## 2015-05-08 HISTORY — DX: Hypomagnesemia: E83.42

## 2015-05-08 LAB — CBC
HEMATOCRIT: 26.3 % — AB (ref 36.0–46.0)
Hemoglobin: 8.1 g/dL — ABNORMAL LOW (ref 12.0–15.0)
MCH: 24.8 pg — AB (ref 26.0–34.0)
MCHC: 30.8 g/dL (ref 30.0–36.0)
MCV: 80.7 fL (ref 78.0–100.0)
Platelets: 119 10*3/uL — ABNORMAL LOW (ref 150–400)
RBC: 3.26 MIL/uL — ABNORMAL LOW (ref 3.87–5.11)
RDW: 14.4 % (ref 11.5–15.5)
WBC: 4.8 10*3/uL (ref 4.0–10.5)

## 2015-05-08 LAB — URINALYSIS, ROUTINE W REFLEX MICROSCOPIC
BILIRUBIN URINE: NEGATIVE
Glucose, UA: NEGATIVE mg/dL
KETONES UR: NEGATIVE mg/dL
Leukocytes, UA: NEGATIVE
NITRITE: NEGATIVE
PH: 5.5 (ref 5.0–8.0)
PROTEIN: NEGATIVE mg/dL
Specific Gravity, Urine: >1.046 — ABNORMAL HIGH (ref 1.005–1.030)

## 2015-05-08 LAB — LIPID PANEL
Cholesterol: 100 mg/dL (ref 0–200)
HDL: 20 mg/dL — ABNORMAL LOW (ref >40)
LDL CALC: 71 mg/dL (ref 0–99)
TRIGLYCERIDES: 46 mg/dL (ref <150)
Total CHOL/HDL Ratio: 5 RATIO
VLDL: 9 mg/dL (ref 0–40)

## 2015-05-08 LAB — TROPONIN I
TROPONIN I: 0.03 ng/mL (ref <0.031)
Troponin I: 0.03 ng/mL (ref <0.031)
Troponin I: 0.03 ng/mL (ref <0.031)

## 2015-05-08 LAB — COMPREHENSIVE METABOLIC PANEL
ALBUMIN: 3 g/dL — AB (ref 3.5–5.0)
ALK PHOS: 93 U/L (ref 38–126)
ALT: 29 U/L (ref 14–54)
AST: 34 U/L (ref 15–41)
Anion gap: 8 (ref 5–15)
BILIRUBIN TOTAL: 0.9 mg/dL (ref 0.3–1.2)
BUN: 10 mg/dL (ref 6–20)
CALCIUM: 8.9 mg/dL (ref 8.9–10.3)
CO2: 24 mmol/L (ref 22–32)
Chloride: 108 mmol/L (ref 101–111)
Creatinine, Ser: 0.45 mg/dL (ref 0.44–1.00)
GFR calc Af Amer: >60 mL/min (ref >60)
GFR calc non Af Amer: >60 mL/min (ref >60)
GLUCOSE: 127 mg/dL — AB (ref 65–99)
POTASSIUM: 3.1 mmol/L — AB (ref 3.5–5.1)
Sodium: 140 mmol/L (ref 135–145)
TOTAL PROTEIN: 5.9 g/dL — AB (ref 6.5–8.1)

## 2015-05-08 LAB — RAPID URINE DRUG SCREEN, HOSP PERFORMED
AMPHETAMINES: NOT DETECTED
BARBITURATES: NOT DETECTED
Benzodiazepines: NOT DETECTED
Cocaine: NOT DETECTED
OPIATES: POSITIVE — AB
TETRAHYDROCANNABINOL: NOT DETECTED

## 2015-05-08 LAB — URINE MICROSCOPIC-ADD ON
BACTERIA UA: NONE SEEN
RBC / HPF: NONE SEEN RBC/hpf (ref 0–5)

## 2015-05-08 LAB — PHOSPHORUS: Phosphorus: 4.7 mg/dL — ABNORMAL HIGH (ref 2.5–4.6)

## 2015-05-08 LAB — MAGNESIUM: Magnesium: 1.4 mg/dL — ABNORMAL LOW (ref 1.7–2.4)

## 2015-05-08 LAB — T4, FREE: Free T4: 5.17 ng/dL — ABNORMAL HIGH (ref 0.61–1.12)

## 2015-05-08 LAB — BRAIN NATRIURETIC PEPTIDE: B Natriuretic Peptide: 213 pg/mL — ABNORMAL HIGH (ref 0.0–100.0)

## 2015-05-08 LAB — MRSA PCR SCREENING: MRSA by PCR: NEGATIVE

## 2015-05-08 MED ORDER — ENOXAPARIN SODIUM 120 MG/0.8ML ~~LOC~~ SOLN
105.0000 mg | Freq: Two times a day (BID) | SUBCUTANEOUS | Status: DC
  Administered 2015-05-08: 105 mg via SUBCUTANEOUS
  Filled 2015-05-08 (×2): qty 0.8

## 2015-05-08 MED ORDER — ACETAMINOPHEN 650 MG RE SUPP: 650.0000 mg | Freq: Four times a day (QID) | RECTAL | Status: DC | PRN

## 2015-05-08 MED ORDER — ACETAMINOPHEN 325 MG PO TABS
650.0000 mg | ORAL_TABLET | ORAL | Status: DC | PRN
  Administered 2015-05-10: 650 mg via ORAL
  Filled 2015-05-08 (×3): qty 2

## 2015-05-08 MED ORDER — ENOXAPARIN SODIUM 40 MG/0.4ML ~~LOC~~ SOLN
40.0000 mg | SUBCUTANEOUS | Status: DC
  Administered 2015-05-09 – 2015-05-11 (×3): 40 mg via SUBCUTANEOUS
  Filled 2015-05-08 (×3): qty 0.4

## 2015-05-08 MED ORDER — IPRATROPIUM BROMIDE 0.02 % IN SOLN: 0.5000 mg | Freq: Four times a day (QID) | RESPIRATORY_TRACT | Status: DC

## 2015-05-08 MED ORDER — GUAIFENESIN ER 600 MG PO TB12
600.0000 mg | ORAL_TABLET | Freq: Two times a day (BID) | ORAL | Status: DC
  Administered 2015-05-08: 600 mg via ORAL
  Filled 2015-05-08: qty 1

## 2015-05-08 MED ORDER — DILTIAZEM HCL 60 MG PO TABS
60.0000 mg | ORAL_TABLET | Freq: Four times a day (QID) | ORAL | Status: DC
  Administered 2015-05-08 (×3): 60 mg via ORAL
  Filled 2015-05-08 (×3): qty 1

## 2015-05-08 MED ORDER — ASPIRIN EC 81 MG PO TBEC
81.0000 mg | DELAYED_RELEASE_TABLET | Freq: Every day | ORAL | Status: DC
  Administered 2015-05-08 – 2015-05-11 (×4): 81 mg via ORAL
  Filled 2015-05-08 (×4): qty 1

## 2015-05-08 MED ORDER — LEVOFLOXACIN IN D5W 750 MG/150ML IV SOLN
750.0000 mg | INTRAVENOUS | Status: DC
  Administered 2015-05-08 – 2015-05-11 (×4): 750 mg via INTRAVENOUS
  Filled 2015-05-08 (×4): qty 150

## 2015-05-08 MED ORDER — DILTIAZEM HCL 100 MG IV SOLR
5.0000 mg/h | Freq: Once | INTRAVENOUS | Status: AC
  Administered 2015-05-08: 15 mg/h via INTRAVENOUS
  Filled 2015-05-08: qty 100

## 2015-05-08 MED ORDER — GUAIFENESIN ER 600 MG PO TB12
1200.0000 mg | ORAL_TABLET | Freq: Two times a day (BID) | ORAL | Status: DC
  Administered 2015-05-08 – 2015-05-11 (×6): 1200 mg via ORAL
  Filled 2015-05-08 (×6): qty 2

## 2015-05-08 MED ORDER — ATENOLOL 25 MG PO TABS
25.0000 mg | ORAL_TABLET | Freq: Every day | ORAL | Status: DC
  Administered 2015-05-08: 25 mg via ORAL
  Filled 2015-05-08: qty 1

## 2015-05-08 MED ORDER — ONDANSETRON HCL 4 MG PO TABS: 4.0000 mg | ORAL_TABLET | Freq: Four times a day (QID) | ORAL | Status: DC | PRN

## 2015-05-08 MED ORDER — ONDANSETRON HCL 4 MG/2ML IJ SOLN
4.0000 mg | Freq: Four times a day (QID) | INTRAMUSCULAR | Status: DC | PRN
  Administered 2015-05-08 (×2): 4 mg via INTRAVENOUS
  Filled 2015-05-08 (×2): qty 2

## 2015-05-08 MED ORDER — SODIUM CHLORIDE 0.9 % IV SOLN
INTRAVENOUS | Status: AC
  Administered 2015-05-08 (×2): via INTRAVENOUS

## 2015-05-08 MED ORDER — METHIMAZOLE 5 MG PO TABS
5.0000 mg | ORAL_TABLET | Freq: Three times a day (TID) | ORAL | Status: DC
  Administered 2015-05-08 – 2015-05-10 (×7): 5 mg via ORAL
  Filled 2015-05-08 (×10): qty 1

## 2015-05-08 MED ORDER — ACETAMINOPHEN 325 MG PO TABS
650.0000 mg | ORAL_TABLET | Freq: Four times a day (QID) | ORAL | Status: DC | PRN
Start: 2015-05-08 — End: 2015-05-11
  Administered 2015-05-08 – 2015-05-09 (×2): 650 mg via ORAL

## 2015-05-08 MED ORDER — POTASSIUM CHLORIDE CRYS ER 20 MEQ PO TBCR
40.0000 meq | EXTENDED_RELEASE_TABLET | Freq: Four times a day (QID) | ORAL | Status: AC
  Administered 2015-05-08 (×2): 40 meq via ORAL
  Filled 2015-05-08 (×2): qty 2

## 2015-05-08 MED ORDER — LEVALBUTEROL HCL 0.63 MG/3ML IN NEBU: 0.6300 mg | INHALATION_SOLUTION | RESPIRATORY_TRACT | Status: DC | PRN

## 2015-05-08 MED ORDER — ALPRAZOLAM 0.25 MG PO TABS
0.2500 mg | ORAL_TABLET | Freq: Two times a day (BID) | ORAL | Status: DC | PRN
  Administered 2015-05-08 – 2015-05-10 (×4): 0.25 mg via ORAL
  Filled 2015-05-08 (×5): qty 1

## 2015-05-08 MED ORDER — MAGNESIUM SULFATE 2 GM/50ML IV SOLN
2.0000 g | Freq: Four times a day (QID) | INTRAVENOUS | Status: AC
Start: 2015-05-08 — End: 2015-05-08
  Administered 2015-05-08 (×2): 2 g via INTRAVENOUS
  Filled 2015-05-08 (×2): qty 50

## 2015-05-08 MED ORDER — ACETAMINOPHEN-CODEINE #3 300-30 MG PO TABS
1.0000 | ORAL_TABLET | Freq: Four times a day (QID) | ORAL | Status: DC | PRN
Start: 2015-05-08 — End: 2015-05-11
  Administered 2015-05-10 – 2015-05-11 (×2): 1 via ORAL
  Filled 2015-05-08 (×2): qty 1

## 2015-05-08 MED ORDER — SODIUM CHLORIDE 0.9 % IJ SOLN
3.0000 mL | Freq: Two times a day (BID) | INTRAMUSCULAR | Status: DC
  Administered 2015-05-08 – 2015-05-10 (×7): 3 mL via INTRAVENOUS

## 2015-05-08 NOTE — Progress Notes (Signed)
[facility] PROGRESS NOTE   Olivia Schmidt Minor [id number] DOB: 08/22/1960 DOA: 05/07/2015 PCP: Lance Bosch, NP  HPI/Subjective: Seen with her husband at bedside, complaining about cough and sputum production. She has chills as well.  Assessment/Plan: Principal Problem:   Atrial fibrillation with RVR (HCC) Active Problems:   Chest pain   Anemia   Hyperthyroidism   Atrial fibrillation (HCC)   Emesis   Acute bronchitis    Atrial fibrillation with RVR Patient presented to the hospital with heart rate of 180, 12-lead EKG showed A. fib with RVR. Cardiology consulted patient placed on Cardizem drip. Heart rate is more controlled now switched to by mouth Cardizem, I will add atenolol as is probably secondary to hyperthyroidism. Back to normal sinus rhythm this morning.  This patients CHA2DS2-VASc Score and unadjusted Ischemic Stroke Rate (% per year) is equal to 0.6 % stroke rate/year from a score of 1 for female gender, on aspirin per cardiology. Above score calculated as 1 point each if present [CHF, HTN, DM, Vascular=MI/PAD/Aortic Plaque, Age if 65-74, or Female] Above score calculated as 2 points each if present [Age > 75, or Stroke/TIA/TE]  Hyperthyroidism Reported weight loss, started as intentional weight loss with Hydroxycut but she lost over 100 pounds in the past 1 year. TSH is 0.013, check free T4 and T3. I have started her on methimazole 5 mg 3 times a day and atenolol 25 mg. Patient will need follow-up with endocrinologist as an outpatient.  Acute bronchitis Patient has severe productive cough, she even reported post tussive emesis. I will start her on levofloxacin, antitussives, mucolytics and oxygen as needed.  Dysphagia Patient reported feeling choked all the time, she's been having difficulty with swallowing for the past 1-2 month. Slightly enlarged thyroid by exam, obtain CT of the neck.  Chest pain This is likely secondary to the A. fib with  rapid ventricular response, patient also has some cough and pleuritic chest pain. Cardiac enzymes being negative, 12-lead EKG showed A. fib with now ischemic changes.  Code Status: Full Code Family Communication: Plan discussed with the patient. Disposition Plan: Remains inpatient Diet: Diet Heart Room service appropriate?: Yes; Fluid consistency:: Thin  Consultants:  Cardiology  Procedures:  None   Antibiotics:  Start levofloxacin (indicate start date, and stop date if known)   Objective: Filed Vitals:   05/08/15 0730 05/08/15 0800  BP: 114/48   Pulse: 87   Temp:  97.6 F (36.4 C)  Resp: 19    No intake or output data in the 24 hours ending 05/08/15 0945 Filed Weights   05/07/15 2125 05/08/15 0214  Weight: 105 kg (231 lb 7.7 oz) 103.4 kg (227 lb 15.3 oz)    Exam: General: Alert and awake, oriented x3, not in any acute distress. HEENT: anicteric sclera, pupils reactive to light and accommodation, EOMI CVS: S1-S2 clear, no murmur rubs or gallops Chest: clear to auscultation bilaterally, no wheezing, rales or rhonchi Abdomen: soft nontender, nondistended, normal bowel sounds, no organomegaly Extremities: no cyanosis, clubbing or edema noted bilaterally Neuro: Cranial nerves II-XII intact, no focal neurological deficits  Data Reviewed: Basic Metabolic Panel:  Recent Labs Lab 05/07/15 1910 05/08/15 0341  NA 140 140  K 3.5 3.1*  CL 107 108  CO2 25 24  GLUCOSE 105* 127*  BUN 11 10  CREATININE 0.56 0.45  CALCIUM 9.4 8.9  MG  --  1.4*  PHOS  --  4.7*   Liver Function Tests:  Recent Labs Lab 05/07/15 1910 05/08/15  0341  AST 48* 34  ALT 35 29  ALKPHOS 115 93  BILITOT 0.9 0.9  PROT 6.9 5.9*  ALBUMIN 3.6 3.0*    Recent Labs Lab 05/07/15 1910  LIPASE 18   No results for input(s): AMMONIA in the last 168 hours. CBC:  Recent Labs Lab 05/07/15 1910 05/08/15 0341  WBC 6.2 4.8  HGB 10.1* 8.1*  HCT 32.0* 26.3*  MCV 81.0 80.7  PLT 151 119*    Cardiac Enzymes:  Recent Labs Lab 05/07/15 2259 05/08/15 0341  TROPONINI 0.03 0.03   BNP (last 3 results)  Recent Labs  05/08/15 0203  BNP 213.0*    ProBNP (last 3 results) No results for input(s): PROBNP in the last 8760 hours.  CBG: No results for input(s): GLUCAP in the last 168 hours.  Micro No results found for this or any previous visit (from the past 240 hour(s)).   Studies: Ct Angio Chest Pe W/cm &/or Wo Cm  05/07/2015  CLINICAL DATA:  Central chest pain. Vomiting. Tachycardia. Elevated D-dimer. Shortness of breath. EXAM: CT ANGIOGRAPHY CHEST WITH CONTRAST TECHNIQUE: Multidetector CT imaging of the chest was performed using the standard protocol during bolus administration of intravenous contrast. Multiplanar CT image reconstructions and MIPs were obtained to evaluate the vascular anatomy. CONTRAST:  176mL OMNIPAQUE IOHEXOL 350 MG/ML SOLN COMPARISON:  07/06/2013 FINDINGS: Technically adequate study with moderately good opacification of the central and proximal segmental pulmonary arteries. Limited opacification of the distal pulmonary arteries. No filling defects demonstrated. No evidence of significant pulmonary embolus. Since the previous study, there has been interval development of small pericardial effusion. Increased density in the anterior mediastinum may represent thymic tissue. Normal heart size. Normal caliber thoracic aorta. No significant lymphadenopathy in the chest. Esophagus is decompressed. Evaluation of lungs is limited due to respiratory motion artifact. No focal consolidation or airspace disease. Small focal ground-glass nodular infiltrate in the left apex measuring about 1 cm diameter was not present on the previous study and may represent inflammatory focus. Follow-up examination in 3 months is suggested. No pleural effusions. No pneumothorax. Included portions of the upper abdominal organs are grossly unremarkable. Degenerative changes in the spine. No  destructive bone lesions. Prominent degenerative changes at the sternomanubrial joint. Review of the MIP images confirms the above findings. IMPRESSION: No evidence of significant pulmonary embolus. Peripheral pulmonary arteries are not well opacified, limiting evaluation. 1 cm ground-glass nodular infiltrate demonstrated in the left apex, not present on previous study. This could represent a small inflammatory focus. Follow-up examination in 3 months is suggested. Electronically Signed   By: Lucienne Capers M.D.   On: 05/07/2015 22:49   Dg Chest Port 1 View  05/07/2015  CLINICAL DATA:  Central chest pain for 1 week, vomiting for 1 month EXAM: PORTABLE CHEST 1 VIEW COMPARISON:  07/06/2013 FINDINGS: Limited AP portable radiograph. Mild cardiac enlargement stable. Vascular pattern normal. Right lung appears to be clear. Difficult to evaluate retrocardiac portion of left lower lobe of left lung appears clear as well. IMPRESSION: Limited study showing no acute abnormality Electronically Signed   By: Skipper Cliche M.D.   On: 05/07/2015 19:37   Ct Renal Stone Study  05/07/2015  CLINICAL DATA:  Abdominal pain, vomiting, history of kidney stone EXAM: CT ABDOMEN AND PELVIS WITHOUT CONTRAST TECHNIQUE: Multidetector CT imaging of the abdomen and pelvis was performed following the standard protocol without IV contrast. COMPARISON:  12/22/2014 FINDINGS: Lung bases are unremarkable. There are streak artifacts from patient's large body habitus. Motion artifacts  are noted. Unenhanced liver shows no biliary ductal dilatation. No calcified gallstones are noted within gallbladder. Unenhanced pancreas, spleen and adrenal glands are unremarkable. Unenhanced kidneys are symmetrical in size. There is no nephrolithiasis. No small bowel obstruction. No ascites or free air. No adenopathy. No pericecal inflammation. Normal appendix partially visualized in axial image 54. No calcified ureteral calculi. There is a right iliac chain  lymph node axial image 49 measures 1.2 by 0.8 cm probable reactive. No distal colonic obstruction. The patient is status post hysterectomy. The urinary bladder is under distended. Again noted bilateral inguinal lymph nodes. The largest right inguinal lymph node measures 1.6 by 1.5 cm. The largest left inguinal lymph node measures 1.6 by 1.4 cm. No destructive bony lesions are noted within pelvis. Sagittal images of the spine shows mild degenerative changes thoracolumbar spine. Right pelvic sidewall lymph node measures 1.8 x 1.3 cm. Left pelvic sidewall lymph node measures 1 by 1.4 cm. IMPRESSION: 1. There is no nephrolithiasis.  No hydronephrosis or hydroureter. 2. No pericecal inflammation.  Normal appendix. 3. No calcified ureteral calculi. 4. No small bowel obstruction. 5. Mild enlarged pelvic and bilateral inguinal lymph nodes. This may be reactive or due to lymphoproliferative disease. Electronically Signed   By: Lahoma Crocker M.D.   On: 05/07/2015 20:25    Scheduled Meds: . aspirin EC  81 mg Oral Daily  . atenolol  25 mg Oral Daily  . diltiazem  60 mg Oral 4 times per day  . enoxaparin (LOVENOX) injection  105 mg Subcutaneous Q12H  . guaiFENesin  600 mg Oral BID  . methimazole  5 mg Oral TID  . sodium chloride  3 mL Intravenous Q12H   Continuous Infusions: . sodium chloride 75 mL/hr at 05/08/15 0501       Time spent: 35 minutes    [facility]  [facility] Pager [phone] If 7PM-7AM, please contact night-coverage at [email or web address], password [email or web address] 05/08/2015, 9:45 AM  LOS: 1 day

## 2015-05-08 NOTE — ED Notes (Signed)
Attempt to call report. Per Network engineer, floor nurse will call the ED.

## 2015-05-08 NOTE — Progress Notes (Signed)
Patient Name: Olivia Schmidt Date of Encounter: 05/08/2015     Active Problems:   Atrial fibrillation with RVR (HCC)   Chest pain   Anemia   Hyperthyroidism   Atrial fibrillation (HCC)   Atrial fibrillation with rapid ventricular response (HCC)   Emesis    SUBJECTIVE  The patient feels well this morning.  She converted back to normal sinus rhythm at about 5:30 AM today.  She is able to feel the difference in her chest.  she has very low TSH suggesting hyperthyroidism.this could explain her weight loss and her paroxysmal atrial fibrillation.  CURRENT MEDS . aspirin EC  81 mg Oral Daily  . enoxaparin (LOVENOX) injection  105 mg Subcutaneous Q12H  . guaiFENesin  600 mg Oral BID  . sodium chloride  3 mL Intravenous Q12H    OBJECTIVE  Filed Vitals:   05/08/15 0105 05/08/15 0214 05/08/15 0500 05/08/15 0600  BP:   139/83 131/41  Pulse:   94 93  Temp: 99 F (37.2 C)  97.6 F (36.4 C)   TempSrc: Oral  Oral   Resp:   24 16  Height:  5\' 4"  (1.626 m)    Weight:  227 lb 15.3 oz (103.4 kg)    SpO2:   100% 100%   No intake or output data in the 24 hours ending 05/08/15 0741 Filed Weights   05/07/15 2125 05/08/15 0214  Weight: 231 lb 7.7 oz (105 kg) 227 lb 15.3 oz (103.4 kg)    PHYSICAL EXAM  General: Pleasant, NAD. Neuro: Alert and oriented X 3. Moves all extremities spontaneously. Psych: Normal affect. HEENT:  Normal. She does appear to have a prominent stare suggestive of hyperthyroidism  Neck: Supple without bruits or JVD. Lungs:  Resp regular and unlabored, CTA. Heart: RRR no s3, s4,there is a grade 1/6 systolic ejection murmur at the base Abdomen: Soft, non-tender, non-distended, BS + x 4.  Extremities: No clubbing, cyanosis or edema. DP/PT/Radials 2+ and equal bilaterally.  Accessory Clinical Findings  CBC  Recent Labs  05/07/15 1910 05/08/15 0341  WBC 6.2 4.8  HGB 10.1* 8.1*  HCT 32.0* 26.3*  MCV 81.0 80.7  PLT 151 123456*   Basic Metabolic  Panel  Recent Labs  05/07/15 1910 05/08/15 0341  NA 140 140  K 3.5 3.1*  CL 107 108  CO2 25 24  GLUCOSE 105* 127*  BUN 11 10  CREATININE 0.56 0.45  CALCIUM 9.4 8.9  MG  --  1.4*  PHOS  --  4.7*   Liver Function Tests  Recent Labs  05/07/15 1910 05/08/15 0341  AST 48* 34  ALT 35 29  ALKPHOS 115 93  BILITOT 0.9 0.9  PROT 6.9 5.9*  ALBUMIN 3.6 3.0*    Recent Labs  05/07/15 1910  LIPASE 18   Cardiac Enzymes  Recent Labs  05/07/15 2259 05/08/15 0341  TROPONINI 0.03 0.03   BNP Invalid input(s): POCBNP D-Dimer  Recent Labs  05/07/15 2144  DDIMER 2.44*   Hemoglobin A1C No results for input(s): HGBA1C in the last 72 hours. Fasting Lipid Panel No results for input(s): CHOL, HDL, LDLCALC, TRIG, CHOLHDL, LDLDIRECT in the last 72 hours. Thyroid Function Tests  Recent Labs  05/07/15 1910  TSH 0.013*    TELE  Normal sinus rhythm  ECG    Radiology/Studies  Ct Angio Chest Pe W/cm &/or Wo Cm  05/07/2015  CLINICAL DATA:  Central chest pain. Vomiting. Tachycardia. Elevated D-dimer. Shortness of breath. EXAM: CT ANGIOGRAPHY CHEST  WITH CONTRAST TECHNIQUE: Multidetector CT imaging of the chest was performed using the standard protocol during bolus administration of intravenous contrast. Multiplanar CT image reconstructions and MIPs were obtained to evaluate the vascular anatomy. CONTRAST:  172mL OMNIPAQUE IOHEXOL 350 MG/ML SOLN COMPARISON:  07/06/2013 FINDINGS: Technically adequate study with moderately good opacification of the central and proximal segmental pulmonary arteries. Limited opacification of the distal pulmonary arteries. No filling defects demonstrated. No evidence of significant pulmonary embolus. Since the previous study, there has been interval development of small pericardial effusion. Increased density in the anterior mediastinum may represent thymic tissue. Normal heart size. Normal caliber thoracic aorta. No significant lymphadenopathy in the  chest. Esophagus is decompressed. Evaluation of lungs is limited due to respiratory motion artifact. No focal consolidation or airspace disease. Small focal ground-glass nodular infiltrate in the left apex measuring about 1 cm diameter was not present on the previous study and may represent inflammatory focus. Follow-up examination in 3 months is suggested. No pleural effusions. No pneumothorax. Included portions of the upper abdominal organs are grossly unremarkable. Degenerative changes in the spine. No destructive bone lesions. Prominent degenerative changes at the sternomanubrial joint. Review of the MIP images confirms the above findings. IMPRESSION: No evidence of significant pulmonary embolus. Peripheral pulmonary arteries are not well opacified, limiting evaluation. 1 cm ground-glass nodular infiltrate demonstrated in the left apex, not present on previous study. This could represent a small inflammatory focus. Follow-up examination in 3 months is suggested. Electronically Signed   By: Lucienne Capers M.D.   On: 05/07/2015 22:49   Dg Chest Port 1 View  05/07/2015  CLINICAL DATA:  Central chest pain for 1 week, vomiting for 1 month EXAM: PORTABLE CHEST 1 VIEW COMPARISON:  07/06/2013 FINDINGS: Limited AP portable radiograph. Mild cardiac enlargement stable. Vascular pattern normal. Right lung appears to be clear. Difficult to evaluate retrocardiac portion of left lower lobe of left lung appears clear as well. IMPRESSION: Limited study showing no acute abnormality Electronically Signed   By: Skipper Cliche M.D.   On: 05/07/2015 19:37   Ct Renal Stone Study  05/07/2015  CLINICAL DATA:  Abdominal pain, vomiting, history of kidney stone EXAM: CT ABDOMEN AND PELVIS WITHOUT CONTRAST TECHNIQUE: Multidetector CT imaging of the abdomen and pelvis was performed following the standard protocol without IV contrast. COMPARISON:  12/22/2014 FINDINGS: Lung bases are unremarkable. There are streak artifacts from  patient's large body habitus. Motion artifacts are noted. Unenhanced liver shows no biliary ductal dilatation. No calcified gallstones are noted within gallbladder. Unenhanced pancreas, spleen and adrenal glands are unremarkable. Unenhanced kidneys are symmetrical in size. There is no nephrolithiasis. No small bowel obstruction. No ascites or free air. No adenopathy. No pericecal inflammation. Normal appendix partially visualized in axial image 54. No calcified ureteral calculi. There is a right iliac chain lymph node axial image 49 measures 1.2 by 0.8 cm probable reactive. No distal colonic obstruction. The patient is status post hysterectomy. The urinary bladder is under distended. Again noted bilateral inguinal lymph nodes. The largest right inguinal lymph node measures 1.6 by 1.5 cm. The largest left inguinal lymph node measures 1.6 by 1.4 cm. No destructive bony lesions are noted within pelvis. Sagittal images of the spine shows mild degenerative changes thoracolumbar spine. Right pelvic sidewall lymph node measures 1.8 x 1.3 cm. Left pelvic sidewall lymph node measures 1 by 1.4 cm. IMPRESSION: 1. There is no nephrolithiasis.  No hydronephrosis or hydroureter. 2. No pericecal inflammation.  Normal appendix. 3. No calcified ureteral calculi.  4. No small bowel obstruction. 5. Mild enlarged pelvic and bilateral inguinal lymph nodes. This may be reactive or due to lymphoproliferative disease. Electronically Signed   By: Lahoma Crocker M.D.   On: 05/07/2015 20:25    ASSESSMENT AND PLAN  1. Paroxysmal atrial fibrillation. Chadss-vasc score of 1 for female sex. Would continue ASA in her case and not pursue longterm anticoagulation after discharge, especially since this appears to be a correctable cause of atrial fibrillation (hyperthyroidism). 2. Anemia ? Etiology. Work-up per primary service. Patient denies any history of visible blood loss. 3. Hyperthyroidism suspected as cause of significant weight  loss.  Plan: Will switch to oral diltiazem. Await results of EF by echo to see if longterm diltiazem or beta blocker will be preferable.  Signed, Darlin Coco MD

## 2015-05-08 NOTE — Progress Notes (Signed)
*  PRELIMINARY RESULTS* Echocardiogram 2D Echocardiogram has been performed.  Olivia Schmidt 05/08/2015, 2:40 PM

## 2015-05-08 NOTE — Progress Notes (Signed)
ANTICOAGULATION CONSULT NOTE - Initial Consult  Pharmacy Consult for Enoxaparin Indication: atrial fibrillation  No Known Allergies  Patient Measurements: Height: 5\' 4"  (162.6 cm) Weight: 227 lb 15.3 oz (103.4 kg) IBW/kg (Calculated) : 54.7 Heparin Dosing Weight:   Vital Signs: Temp: 97.6 F (36.4 C) (11/27 0500) Temp Source: Oral (11/27 0500) BP: 124/83 mmHg (11/27 0015) Pulse Rate: 93 (11/26 2345)  Labs:  Recent Labs  05/07/15 1910 05/07/15 2259 05/08/15 0341  HGB 10.1*  --  8.1*  HCT 32.0*  --  26.3*  PLT 151  --  119*  CREATININE 0.56  --  0.45  TROPONINI  --  0.03 0.03    Estimated Creatinine Clearance: 97.5 mL/min (by C-G formula based on Cr of 0.45).   Medical History: Past Medical History  Diagnosis Date  . Fibroids     Medications:  Scheduled:  . aspirin EC  81 mg Oral Daily  . enoxaparin (LOVENOX) injection  105 mg Subcutaneous Q12H  . guaiFENesin  600 mg Oral BID  . sodium chloride  3 mL Intravenous Q12H    Assessment: Patient with new afib.  Enoxaparin started in ED.    Goal of Therapy:  Anti-Xa level 0.6-1 units/ml 4hrs after LMWH dose given Monitor platelets by anticoagulation protocol: Yes   Plan:  Enoxaparin 105mg  sq q12hr CBC q3 days Olivia Schmidt 05/08/2015,6:05 AM

## 2015-05-09 ENCOUNTER — Inpatient Hospital Stay (HOSPITAL_COMMUNITY): Payer: Self-pay

## 2015-05-09 DIAGNOSIS — D696 Thrombocytopenia, unspecified: Secondary | ICD-10-CM

## 2015-05-09 DIAGNOSIS — I480 Paroxysmal atrial fibrillation: Secondary | ICD-10-CM

## 2015-05-09 DIAGNOSIS — D500 Iron deficiency anemia secondary to blood loss (chronic): Secondary | ICD-10-CM

## 2015-05-09 DIAGNOSIS — E0590 Thyrotoxicosis, unspecified without thyrotoxic crisis or storm: Secondary | ICD-10-CM

## 2015-05-09 DIAGNOSIS — J209 Acute bronchitis, unspecified: Secondary | ICD-10-CM

## 2015-05-09 DIAGNOSIS — I4891 Unspecified atrial fibrillation: Secondary | ICD-10-CM

## 2015-05-09 HISTORY — DX: Paroxysmal atrial fibrillation: I48.0

## 2015-05-09 HISTORY — DX: Thrombocytopenia, unspecified: D69.6

## 2015-05-09 LAB — CBC
HEMATOCRIT: 27.9 % — AB (ref 36.0–46.0)
HEMOGLOBIN: 8.6 g/dL — AB (ref 12.0–15.0)
MCH: 25.2 pg — ABNORMAL LOW (ref 26.0–34.0)
MCHC: 30.8 g/dL (ref 30.0–36.0)
MCV: 81.8 fL (ref 78.0–100.0)
Platelets: 101 10*3/uL — ABNORMAL LOW (ref 150–400)
RBC: 3.41 MIL/uL — AB (ref 3.87–5.11)
RDW: 14.7 % (ref 11.5–15.5)
WBC: 3.7 10*3/uL — AB (ref 4.0–10.5)

## 2015-05-09 LAB — BASIC METABOLIC PANEL
ANION GAP: 10 (ref 5–15)
BUN: 9 mg/dL (ref 6–20)
CHLORIDE: 107 mmol/L (ref 101–111)
CO2: 21 mmol/L — ABNORMAL LOW (ref 22–32)
Calcium: 9.3 mg/dL (ref 8.9–10.3)
Creatinine, Ser: 0.5 mg/dL (ref 0.44–1.00)
Glucose, Bld: 107 mg/dL — ABNORMAL HIGH (ref 65–99)
POTASSIUM: 4 mmol/L (ref 3.5–5.1)
SODIUM: 138 mmol/L (ref 135–145)

## 2015-05-09 LAB — HEMOGLOBIN A1C
HEMOGLOBIN A1C: 6.1 % — AB (ref 4.8–5.6)
Mean Plasma Glucose: 128 mg/dL

## 2015-05-09 LAB — T3: T3 TOTAL: 451 ng/dL — AB (ref 71–180)

## 2015-05-09 LAB — MAGNESIUM: MAGNESIUM: 1.7 mg/dL (ref 1.7–2.4)

## 2015-05-09 MED ORDER — IPRATROPIUM BROMIDE 0.02 % IN SOLN: 0.5000 mg | Freq: Four times a day (QID) | RESPIRATORY_TRACT | Status: DC

## 2015-05-09 MED ORDER — ATENOLOL 25 MG PO TABS
50.0000 mg | ORAL_TABLET | Freq: Every day | ORAL | Status: DC
  Administered 2015-05-09: 50 mg via ORAL
  Filled 2015-05-09: qty 2

## 2015-05-09 MED ORDER — MAGNESIUM OXIDE 400 (241.3 MG) MG PO TABS
400.0000 mg | ORAL_TABLET | Freq: Every day | ORAL | Status: DC
  Administered 2015-05-09: 400 mg via ORAL
  Filled 2015-05-09: qty 1

## 2015-05-09 MED ORDER — POTASSIUM CHLORIDE CRYS ER 20 MEQ PO TBCR
40.0000 meq | EXTENDED_RELEASE_TABLET | Freq: Once | ORAL | Status: AC
  Administered 2015-05-09: 40 meq via ORAL
  Filled 2015-05-09: qty 2

## 2015-05-09 MED ORDER — DILTIAZEM HCL 100 MG IV SOLR
INTRAVENOUS | Status: AC
  Filled 2015-05-09: qty 100

## 2015-05-09 MED ORDER — MAGNESIUM SULFATE 2 GM/50ML IV SOLN
2.0000 g | Freq: Once | INTRAVENOUS | Status: AC
  Administered 2015-05-09: 2 g via INTRAVENOUS
  Filled 2015-05-09: qty 50

## 2015-05-09 MED ORDER — DEXTROSE 5 % IV SOLN
5.0000 mg/h | INTRAVENOUS | Status: DC
  Administered 2015-05-09: 5 mg/h via INTRAVENOUS

## 2015-05-09 MED ORDER — LEVALBUTEROL HCL 0.63 MG/3ML IN NEBU: 0.6300 mg | INHALATION_SOLUTION | Freq: Four times a day (QID) | RESPIRATORY_TRACT | Status: DC | PRN

## 2015-05-09 MED ORDER — DILTIAZEM LOAD VIA INFUSION
10.0000 mg | Freq: Once | INTRAVENOUS | Status: AC
  Administered 2015-05-09: 10 mg via INTRAVENOUS
  Filled 2015-05-09: qty 10

## 2015-05-09 NOTE — Care Management Note (Signed)
Case Management Note  Patient Details  Name: Olivia Schmidt MRN: [id number] Date of Birth: [birth year]  Subjective/Objective:        Chest pain /a.fib with rvr            Action/Plan:Date: May 09, 2015 Chart reviewed for concurrent status and case management needs. Will continue to follow patient for changes and needs: Velva Harman, RN, BSN, [place]   [phone]   Expected Discharge Date:  05/10/15               Expected Discharge Plan:  Home/Self Care  In-House Referral:  NA  Discharge planning Services  CM Consult  Post Acute Care Choice:  NA Choice offered to:  NA  DME Arranged:    DME Agency:     HH Arranged:    HH Agency:     Status of Service:  In process, will continue to follow  Medicare Important Message Given:    Date Medicare IM Given:    Medicare IM give by:    Date Additional Medicare IM Given:    Additional Medicare Important Message give by:     If discussed at [place] of Stay Meetings, dates discussed:    Additional Comments:  Leeroy Cha, RN 05/09/2015, 11:32 AM

## 2015-05-09 NOTE — Progress Notes (Addendum)
[facility] PROGRESS NOTE   Olivia Schmidt Minor [id number] DOB: [birth year] DOA: 05/07/2015 PCP: Lance Bosch, NP  HPI/Subjective: Seen with her husband at bedside, complaining about cough and sputum production. She has chills as well.  Assessment/Plan: Principal Problem:   Atrial fibrillation with RVR (HCC) Active Problems:   Chest pain   Anemia   Hyperthyroidism   Emesis   Acute bronchitis   Hypokalemia   Hypomagnesemia   PAF (paroxysmal atrial fibrillation) (HCC)   Thrombocytopenia (HCC)    Atrial fibrillation with RVR Patient presented to the hospital with heart rate of 180, 12-lead EKG showed A. fib with RVR. Cardiology consulted patient placed on Cardizem drip. Heart rate is more controlled now switched to by mouth Cardizem, I will add atenolol as is probably secondary to hyperthyroidism. Back and forth from normal sinus rhythm to A. fib, increased atenolol dose to 50 mg.  This patients CHA2DS2-VASc Score and unadjusted Ischemic Stroke Rate (% per year) is equal to 0.6 % stroke rate/year from a score of 1 for female gender, on aspirin per cardiology. Above score calculated as 1 point each if present [CHF, HTN, DM, Vascular=MI/PAD/Aortic Plaque, Age if 65-74, or Female] Above score calculated as 2 points each if present [Age > 75, or Stroke/TIA/TE]  Hyperthyroidism Reported weight loss, started as intentional weight loss with Hydroxycut but she lost over 100 pounds in the past 1 year. TSH is 0.013, free T4 high at 5.17 total T3 high at 451. I have started her on methimazole 5 mg 3 times a day and atenolol 50 mg. Patient will need follow-up with endocrinologist as an outpatient.  Acute bronchitis Patient has severe productive cough, she even reported post tussive emesis. Started on levofloxacin, antitussives, mucolytics and oxygen as needed.  Dysphagia Patient reported feeling choked all the time, she's been having difficulty with swallowing for the past  1-2 month. Slightly enlarged thyroid by exam, obtain neck US.  Chest pain This is likely secondary to the A. fib with rapid ventricular response, patient also has some cough and pleuritic chest pain. Cardiac enzymes being negative, 12-lead EKG showed A. fib with now ischemic changes.  Hypokalemia and hypomagnesemia Corrected with oral supplements.  Code Status: Full Code Family Communication: Plan discussed with the patient. Disposition Plan: Remains inpatient Diet: Diet Heart Room service appropriate?: Yes; Fluid consistency:: Thin  Consultants:  Cardiology  Procedures:  None   Antibiotics:  Levofloxacin (indicate start date, and stop date if known)   Objective: Filed Vitals:   05/09/15 0600 05/09/15 0800  BP: 129/82   Pulse: 45   Temp:  97.7 F (36.5 C)  Resp: 24     Intake/Output Summary (Last 24 hours) at 05/09/15 1029 Last data filed at 05/09/15 0700  Gross per 24 hour  Intake 724.58 ml  Output    400 ml  Net 324.58 ml   Filed Weights   05/07/15 2125 05/08/15 0214  Weight: 105 kg (231 lb 7.7 oz) 103.4 kg (227 lb 15.3 oz)    Exam: General: Alert and awake, oriented x3, not in any acute distress. HEENT: anicteric sclera, pupils reactive to light and accommodation, EOMI CVS: S1-S2 clear, no murmur rubs or gallops Chest: clear to auscultation bilaterally, no wheezing, rales or rhonchi Abdomen: soft nontender, nondistended, normal bowel sounds, no organomegaly Extremities: no cyanosis, clubbing or edema noted bilaterally Neuro: Cranial nerves II-XII intact, no focal neurological deficits  Data Reviewed: Basic Metabolic Panel:  Recent Labs Lab 05/07/15 1910 05/08/15 0341 05/09/15 0344  NA 140 140 138  K 3.5 3.1* 4.0  CL 107 108 107  CO2 25 24 21*  GLUCOSE 105* 127* 107*  BUN 11 10 9   CREATININE 0.56 0.45 0.50  CALCIUM 9.4 8.9 9.3  MG  --  1.4* 1.7  PHOS  --  4.7*  --    Liver Function Tests:  Recent Labs Lab 05/07/15 1910 05/08/15 0341   AST 48* 34  ALT 35 29  ALKPHOS 115 93  BILITOT 0.9 0.9  PROT 6.9 5.9*  ALBUMIN 3.6 3.0*    Recent Labs Lab 05/07/15 1910  LIPASE 18   No results for input(s): AMMONIA in the last 168 hours. CBC:  Recent Labs Lab 05/07/15 1910 05/08/15 0341 05/09/15 0344  WBC 6.2 4.8 3.7*  HGB 10.1* 8.1* 8.6*  HCT 32.0* 26.3* 27.9*  MCV 81.0 80.7 81.8  PLT 151 119* 101*   Cardiac Enzymes:  Recent Labs Lab 05/07/15 2259 05/08/15 0341 05/08/15 0915  TROPONINI 0.03 0.03 0.03   BNP (last 3 results)  Recent Labs  05/08/15 0203  BNP 213.0*    ProBNP (last 3 results) No results for input(s): PROBNP in the last 8760 hours.  CBG: No results for input(s): GLUCAP in the last 168 hours.  Micro Recent Results (from the past 240 hour(s))  MRSA PCR Screening     Status: None   Collection Time: 05/08/15  1:46 AM  Result Value Ref Range Status   MRSA by PCR NEGATIVE NEGATIVE Final    Comment:        The GeneXpert MRSA Assay (FDA approved for NASAL specimens only), is one component of a comprehensive MRSA colonization surveillance program. It is not intended to diagnose MRSA infection nor to guide or monitor treatment for MRSA infections.      Studies: Ct Angio Chest Pe W/cm &/or Wo Cm  05/07/2015  CLINICAL DATA:  Central chest pain. Vomiting. Tachycardia. Elevated D-dimer. Shortness of breath. EXAM: CT ANGIOGRAPHY CHEST WITH CONTRAST TECHNIQUE: Multidetector CT imaging of the chest was performed using the standard protocol during bolus administration of intravenous contrast. Multiplanar CT image reconstructions and MIPs were obtained to evaluate the vascular anatomy. CONTRAST:  121mL OMNIPAQUE IOHEXOL 350 MG/ML SOLN COMPARISON:  07/06/2013 FINDINGS: Technically adequate study with moderately good opacification of the central and proximal segmental pulmonary arteries. Limited opacification of the distal pulmonary arteries. No filling defects demonstrated. No evidence of  significant pulmonary embolus. Since the previous study, there has been interval development of small pericardial effusion. Increased density in the anterior mediastinum may represent thymic tissue. Normal heart size. Normal caliber thoracic aorta. No significant lymphadenopathy in the chest. Esophagus is decompressed. Evaluation of lungs is limited due to respiratory motion artifact. No focal consolidation or airspace disease. Small focal ground-glass nodular infiltrate in the left apex measuring about 1 cm diameter was not present on the previous study and may represent inflammatory focus. Follow-up examination in 3 months is suggested. No pleural effusions. No pneumothorax. Included portions of the upper abdominal organs are grossly unremarkable. Degenerative changes in the spine. No destructive bone lesions. Prominent degenerative changes at the sternomanubrial joint. Review of the MIP images confirms the above findings. IMPRESSION: No evidence of significant pulmonary embolus. Peripheral pulmonary arteries are not well opacified, limiting evaluation. 1 cm ground-glass nodular infiltrate demonstrated in the left apex, not present on previous study. This could represent a small inflammatory focus. Follow-up examination in 3 months is suggested. Electronically Signed   By: Oren Beckmann.D.  On: 05/07/2015 22:49   Dg Chest Port 1 View  05/07/2015  CLINICAL DATA:  Central chest pain for 1 week, vomiting for 1 month EXAM: PORTABLE CHEST 1 VIEW COMPARISON:  07/06/2013 FINDINGS: Limited AP portable radiograph. Mild cardiac enlargement stable. Vascular pattern normal. Right lung appears to be clear. Difficult to evaluate retrocardiac portion of left lower lobe of left lung appears clear as well. IMPRESSION: Limited study showing no acute abnormality Electronically Signed   By: Skipper Cliche M.D.   On: 05/07/2015 19:37   Ct Renal Stone Study  05/07/2015  CLINICAL DATA:  Abdominal pain, vomiting, history  of kidney stone EXAM: CT ABDOMEN AND PELVIS WITHOUT CONTRAST TECHNIQUE: Multidetector CT imaging of the abdomen and pelvis was performed following the standard protocol without IV contrast. COMPARISON:  12/22/2014 FINDINGS: Lung bases are unremarkable. There are streak artifacts from patient's large body habitus. Motion artifacts are noted. Unenhanced liver shows no biliary ductal dilatation. No calcified gallstones are noted within gallbladder. Unenhanced pancreas, spleen and adrenal glands are unremarkable. Unenhanced kidneys are symmetrical in size. There is no nephrolithiasis. No small bowel obstruction. No ascites or free air. No adenopathy. No pericecal inflammation. Normal appendix partially visualized in axial image 54. No calcified ureteral calculi. There is a right iliac chain lymph node axial image 49 measures 1.2 by 0.8 cm probable reactive. No distal colonic obstruction. The patient is status post hysterectomy. The urinary bladder is under distended. Again noted bilateral inguinal lymph nodes. The largest right inguinal lymph node measures 1.6 by 1.5 cm. The largest left inguinal lymph node measures 1.6 by 1.4 cm. No destructive bony lesions are noted within pelvis. Sagittal images of the spine shows mild degenerative changes thoracolumbar spine. Right pelvic sidewall lymph node measures 1.8 x 1.3 cm. Left pelvic sidewall lymph node measures 1 by 1.4 cm. IMPRESSION: 1. There is no nephrolithiasis.  No hydronephrosis or hydroureter. 2. No pericecal inflammation.  Normal appendix. 3. No calcified ureteral calculi. 4. No small bowel obstruction. 5. Mild enlarged pelvic and bilateral inguinal lymph nodes. This may be reactive or due to lymphoproliferative disease. Electronically Signed   By: Lahoma Crocker M.D.   On: 05/07/2015 20:25    Scheduled Meds: . aspirin EC  81 mg Oral Daily  . atenolol  50 mg Oral Daily  . enoxaparin (LOVENOX) injection  40 mg Subcutaneous Q24H  . guaiFENesin  1,200 mg Oral BID    . levofloxacin (LEVAQUIN) IV  750 mg Intravenous Q24H  . magnesium oxide  400 mg Oral Daily  . methimazole  5 mg Oral TID  . sodium chloride  3 mL Intravenous Q12H   Continuous Infusions:       Time spent: 35 minutes    [facility]  [facility] Pager [phone] If 7PM-7AM, please contact night-coverage at [email or web address], password [facility] 05/09/2015, 10:29 AM  LOS: 2 days

## 2015-05-09 NOTE — Progress Notes (Signed)
At about 0300, patient became tachycardic with HR in 130s and 140s, EKG completed, showed pateint to be in A-Fib with RVR. Dr. Wynonia Lawman (cardiologist) notified, order received to give 10 mg cardizem IV bolus and restart patient on cardizem drip. Blood pressure at this time was 132/53. Pt endorsed feeling her heart racing, but denies chest pain.

## 2015-05-09 NOTE — Progress Notes (Signed)
Patient: Olivia Schmidt / Admit Date: 05/07/2015 / Date of Encounter: 05/09/2015, 7:46 AM   Subjective: Feeling better this AM but started coughing again while I was in the room.    Objective: Telemetry: paroxysms of atrial fib and atrial flutter noted on telemetry overnight, rates as high as 160s, now currently sinus tach with occasional PACs - HR 100-110 Physical Exam: Blood pressure 129/82, pulse 45, temperature 99.5 F (37.5 C), temperature source Oral, resp. rate 24, height 5\' 4"  (1.626 m), weight 227 lb 15.3 oz (103.4 kg), last menstrual period 09/14/2010, SpO2 100 %. General: Well developed, well nourished AAF in no acute distress. Head: Normocephalic, atraumatic, sclera non-icteric, no xanthomas, nares are without discharge. Neck: Negative for carotid bruits. JVP not elevated. Lungs: Clear bilaterally to auscultation without wheezes, rales, or rhonchi. Breathing is unlabored. Heart: Reg rhythm, increased rate, occasional ectopy, S1 S2 without murmurs, rubs, or gallops.  Abdomen: Soft, non-tender, non-distended with normoactive bowel sounds. No rebound/guarding. Extremities: No clubbing or cyanosis. No edema. Distal pedal pulses are 2+ and equal bilaterally. Neuro: Alert and oriented X 3. Moves all extremities spontaneously. Psych:  Responds to questions appropriately with a normal affect.   Intake/Output Summary (Last 24 hours) at 05/09/15 0746 Last data filed at 05/09/15 0700  Gross per 24 hour  Intake 1533.33 ml  Output    400 ml  Net 1133.33 ml    Inpatient Medications:  . aspirin EC  81 mg Oral Daily  . atenolol  50 mg Oral Daily  . diltiazem  60 mg Oral 4 times per day  . enoxaparin (LOVENOX) injection  40 mg Subcutaneous Q24H  . guaiFENesin  1,200 mg Oral BID  . levofloxacin (LEVAQUIN) IV  750 mg Intravenous Q24H  . magnesium oxide  400 mg Oral Daily  . methimazole  5 mg Oral TID  . sodium chloride  3 mL Intravenous Q12H   Infusions:  . diltiazem (CARDIZEM)  infusion 10 mg/hr (05/09/15 0700)   Labs:  Recent Labs  05/08/15 0341 05/09/15 0344  NA 140 138  K 3.1* 4.0  CL 108 107  CO2 24 21*  GLUCOSE 127* 107*  BUN 10 9  CREATININE 0.45 0.50  CALCIUM 8.9 9.3  MG 1.4* 1.7  PHOS 4.7*  --     Recent Labs  05/07/15 1910 05/08/15 0341  AST 48* 34  ALT 35 29  ALKPHOS 115 93  BILITOT 0.9 0.9  PROT 6.9 5.9*  ALBUMIN 3.6 3.0*    Recent Labs  05/08/15 0341 05/09/15 0344  WBC 4.8 3.7*  HGB 8.1* 8.6*  HCT 26.3* 27.9*  MCV 80.7 81.8  PLT 119* 101*    Recent Labs  05/07/15 2259 05/08/15 0341 05/08/15 0915  TROPONINI 0.03 0.03 0.03   Radiology/Studies:  Ct Angio Chest Pe W/cm &/or Wo Cm  05/07/2015  CLINICAL DATA:  Central chest pain. Vomiting. Tachycardia. Elevated D-dimer. Shortness of breath. EXAM: CT ANGIOGRAPHY CHEST WITH CONTRAST TECHNIQUE: Multidetector CT imaging of the chest was performed using the standard protocol during bolus administration of intravenous contrast. Multiplanar CT image reconstructions and MIPs were obtained to evaluate the vascular anatomy. CONTRAST:  125mL OMNIPAQUE IOHEXOL 350 MG/ML SOLN COMPARISON:  07/06/2013 FINDINGS: Technically adequate study with moderately good opacification of the central and proximal segmental pulmonary arteries. Limited opacification of the distal pulmonary arteries. No filling defects demonstrated. No evidence of significant pulmonary embolus. Since the previous study, there has been interval development of small pericardial effusion. Increased density in the anterior  mediastinum may represent thymic tissue. Normal heart size. Normal caliber thoracic aorta. No significant lymphadenopathy in the chest. Esophagus is decompressed. Evaluation of lungs is limited due to respiratory motion artifact. No focal consolidation or airspace disease. Small focal ground-glass nodular infiltrate in the left apex measuring about 1 cm diameter was not present on the previous study and may  represent inflammatory focus. Follow-up examination in 3 months is suggested. No pleural effusions. No pneumothorax. Included portions of the upper abdominal organs are grossly unremarkable. Degenerative changes in the spine. No destructive bone lesions. Prominent degenerative changes at the sternomanubrial joint. Review of the MIP images confirms the above findings. IMPRESSION: No evidence of significant pulmonary embolus. Peripheral pulmonary arteries are not well opacified, limiting evaluation. 1 cm ground-glass nodular infiltrate demonstrated in the left apex, not present on previous study. This could represent a small inflammatory focus. Follow-up examination in 3 months is suggested. Electronically Signed   By: Lucienne Capers M.D.   On: 05/07/2015 22:49   Dg Chest Port 1 View  05/07/2015  CLINICAL DATA:  Central chest pain for 1 week, vomiting for 1 month EXAM: PORTABLE CHEST 1 VIEW COMPARISON:  07/06/2013 FINDINGS: Limited AP portable radiograph. Mild cardiac enlargement stable. Vascular pattern normal. Right lung appears to be clear. Difficult to evaluate retrocardiac portion of left lower lobe of left lung appears clear as well. IMPRESSION: Limited study showing no acute abnormality Electronically Signed   By: Skipper Cliche M.D.   On: 05/07/2015 19:37   Ct Renal Stone Study  05/07/2015  CLINICAL DATA:  Abdominal pain, vomiting, history of kidney stone EXAM: CT ABDOMEN AND PELVIS WITHOUT CONTRAST TECHNIQUE: Multidetector CT imaging of the abdomen and pelvis was performed following the standard protocol without IV contrast. COMPARISON:  12/22/2014 FINDINGS: Lung bases are unremarkable. There are streak artifacts from patient's large body habitus. Motion artifacts are noted. Unenhanced liver shows no biliary ductal dilatation. No calcified gallstones are noted within gallbladder. Unenhanced pancreas, spleen and adrenal glands are unremarkable. Unenhanced kidneys are symmetrical in size. There is no  nephrolithiasis. No small bowel obstruction. No ascites or free air. No adenopathy. No pericecal inflammation. Normal appendix partially visualized in axial image 54. No calcified ureteral calculi. There is a right iliac chain lymph node axial image 49 measures 1.2 by 0.8 cm probable reactive. No distal colonic obstruction. The patient is status post hysterectomy. The urinary bladder is under distended. Again noted bilateral inguinal lymph nodes. The largest right inguinal lymph node measures 1.6 by 1.5 cm. The largest left inguinal lymph node measures 1.6 by 1.4 cm. No destructive bony lesions are noted within pelvis. Sagittal images of the spine shows mild degenerative changes thoracolumbar spine. Right pelvic sidewall lymph node measures 1.8 x 1.3 cm. Left pelvic sidewall lymph node measures 1 by 1.4 cm. IMPRESSION: 1. There is no nephrolithiasis.  No hydronephrosis or hydroureter. 2. No pericecal inflammation.  Normal appendix. 3. No calcified ureteral calculi. 4. No small bowel obstruction. 5. Mild enlarged pelvic and bilateral inguinal lymph nodes. This may be reactive or due to lymphoproliferative disease. Electronically Signed   By: Lahoma Crocker M.D.   On: 05/07/2015 20:25     Assessment and Plan   60F with h/o uterine fibroids, obesity (taking hydroxycut) who was admitted with n/v x1 month found to have atrial fib with RVR and newly recognized anemia and hyperthyroidism. 2D echo 05/08/15: mild LVH, EF 50-55%, mild MR, trivial pericardial effusion.  1. Paroxysmal atrial fib/atrial flutter - likely largely explained  by new diagnosis of hyperthyroidism. Echo as above. CHADSVASC tentatively only 1 for female, A1c pending. Initially converted to NSR but went back into rapid AF yesterday - also appears to have had had atrial flutter on telemetry as well. Her oral diltiazem was held and changed back to IV form. Now back in sinus tach. Per d/w Dr. Hartford Poli, preference is to use nonselective beta blocker in  hyperthyroid state rather than diltiazem. She will be getting first dose of atenolol 50mg  this AM - she can get first dose now and then d/c diltiazem drip 1 hour afterwards. Hold off on oral cardizem pending further titration of BB. Per Dr. Mare Ferrari, continue ASA in her case and not pursue longterm anticoagulation after discharge, especially since this appears to be a correctable cause of atrial fibrillation   2. Hyperthyroidism - per IM.  3. Anemia/thrombocytopenia - no obvous bleeding reported, per internal medicine.  4. Cough/chills - felt 2/2 acute bronchitis, now on abx. 1cm nodular infiltrate noted on CTA with recommendation for f/u in 3 months. This will need to be arranged per primary team at discharge. Pending [place] of neck for throat fullness/possible relationship to #3.  5. Hypomagnesemia/hypokalemia - s/p repletion. Will start MagOx 400mg  daily. F/u potassium level in AM to determine if scheduled repletion is necessary.  Signed, Melina Copa PA-C Pager: [phone]   The patient was seen, examined and discussed with Melina Copa, PA-C and I agree with the above.   54 year old female with paroxysmal atrial fib with RVR and atrial flutter and newly recognized anemia and hyperthyroidism. In and out of a-fib with RVR and sinus tachycardia, started on atenolol 50 mg po this morning, on PO diltiazem and iv Diltiazem that will be discontinued.  She remains tachycardic, we will follow closely. Her BP is low. Started on iv levofloxacin for acute bronchitis. On methimazole for hyperthyroidism.  CHA2DS2-VASc Score 1, on ASA only.  Dorothy Spark 05/09/2015

## 2015-05-10 DIAGNOSIS — R072 Precordial pain: Secondary | ICD-10-CM

## 2015-05-10 DIAGNOSIS — I4892 Unspecified atrial flutter: Secondary | ICD-10-CM

## 2015-05-10 DIAGNOSIS — E049 Nontoxic goiter, unspecified: Secondary | ICD-10-CM

## 2015-05-10 DIAGNOSIS — E876 Hypokalemia: Secondary | ICD-10-CM

## 2015-05-10 DIAGNOSIS — I480 Paroxysmal atrial fibrillation: Principal | ICD-10-CM

## 2015-05-10 LAB — MAGNESIUM: Magnesium: 1.6 mg/dL — ABNORMAL LOW (ref 1.7–2.4)

## 2015-05-10 LAB — BASIC METABOLIC PANEL
ANION GAP: 5 (ref 5–15)
BUN: 9 mg/dL (ref 6–20)
CHLORIDE: 106 mmol/L (ref 101–111)
CO2: 27 mmol/L (ref 22–32)
Calcium: 9.1 mg/dL (ref 8.9–10.3)
Creatinine, Ser: 0.46 mg/dL (ref 0.44–1.00)
GFR calc Af Amer: >60 mL/min (ref >60)
GLUCOSE: 105 mg/dL — AB (ref 65–99)
POTASSIUM: 4 mmol/L (ref 3.5–5.1)
Sodium: 138 mmol/L (ref 135–145)

## 2015-05-10 LAB — CBC
HEMATOCRIT: 27.2 % — AB (ref 36.0–46.0)
HEMOGLOBIN: 8.5 g/dL — AB (ref 12.0–15.0)
MCH: 25.4 pg — AB (ref 26.0–34.0)
MCHC: 31.3 g/dL (ref 30.0–36.0)
MCV: 81.2 fL (ref 78.0–100.0)
Platelets: 98 10*3/uL — ABNORMAL LOW (ref 150–400)
RBC: 3.35 MIL/uL — AB (ref 3.87–5.11)
RDW: 14.5 % (ref 11.5–15.5)
WBC: 3.4 10*3/uL — ABNORMAL LOW (ref 4.0–10.5)

## 2015-05-10 MED ORDER — METHIMAZOLE 10 MG PO TABS
10.0000 mg | ORAL_TABLET | Freq: Three times a day (TID) | ORAL | Status: DC
  Administered 2015-05-10 – 2015-05-11 (×4): 10 mg via ORAL
  Filled 2015-05-10 (×7): qty 1

## 2015-05-10 MED ORDER — MAGNESIUM SULFATE 2 GM/50ML IV SOLN
2.0000 g | Freq: Once | INTRAVENOUS | Status: AC
  Administered 2015-05-10: 2 g via INTRAVENOUS
  Filled 2015-05-10: qty 50

## 2015-05-10 MED ORDER — MAGNESIUM OXIDE 400 (241.3 MG) MG PO TABS
400.0000 mg | ORAL_TABLET | Freq: Two times a day (BID) | ORAL | Status: DC
  Administered 2015-05-10 – 2015-05-11 (×2): 400 mg via ORAL
  Filled 2015-05-10 (×2): qty 1

## 2015-05-10 MED ORDER — FUROSEMIDE 10 MG/ML IJ SOLN
40.0000 mg | Freq: Once | INTRAMUSCULAR | Status: AC
  Administered 2015-05-10: 40 mg via INTRAVENOUS
  Filled 2015-05-10: qty 4

## 2015-05-10 MED ORDER — DILTIAZEM HCL ER COATED BEADS 120 MG PO CP24
120.0000 mg | ORAL_CAPSULE | Freq: Every day | ORAL | Status: DC
  Administered 2015-05-10 – 2015-05-11 (×2): 120 mg via ORAL
  Filled 2015-05-10 (×2): qty 1

## 2015-05-10 MED ORDER — ATENOLOL 50 MG PO TABS
50.0000 mg | ORAL_TABLET | Freq: Two times a day (BID) | ORAL | Status: DC
  Administered 2015-05-10 – 2015-05-11 (×3): 50 mg via ORAL
  Filled 2015-05-10 (×2): qty 1
  Filled 2015-05-10: qty 2

## 2015-05-10 MED ORDER — HYDROCORTISONE 1 % EX CREA
TOPICAL_CREAM | Freq: Two times a day (BID) | CUTANEOUS | Status: DC
  Administered 2015-05-10 – 2015-05-11 (×2): via TOPICAL
  Filled 2015-05-10: qty 28

## 2015-05-10 MED ORDER — MAGNESIUM OXIDE 400 (241.3 MG) MG PO TABS: 400.0000 mg | ORAL_TABLET | Freq: Two times a day (BID) | ORAL | Status: DC

## 2015-05-10 MED ORDER — FUROSEMIDE 10 MG/ML IJ SOLN
INTRAMUSCULAR | Status: AC
  Filled 2015-05-10: qty 4

## 2015-05-10 NOTE — Progress Notes (Signed)
[facility] PROGRESS NOTE   Olivia Schmidt Minor [id number] DOB: 1961/04/17 DOA: 05/07/2015 PCP: Lance Bosch, NP  HPI/Subjective: Still complaining of some cough but states it seems better since starting Mucinex  Denies fever, chills, neck pain, palpitations  Assessment/Plan: Principal Problem:   Atrial fibrillation with RVR (HCC) Active Problems:   Chest pain   Anemia   Hyperthyroidism   Emesis   Acute bronchitis   Hypokalemia   Hypomagnesemia   PAF (paroxysmal atrial fibrillation) (HCC)   Thrombocytopenia (HCC)   Atrial fibrillation with rapid ventricular response (HCC)    Atrial fibrillation with RVR -Patient presented to the hospital with heart rate of 180, 12-lead EKG showed A. fib with RVR. -Initially on cardizem drip, switched to PO atenolol as is probably secondary to hyperthyroidism. -Back and forth from NSR to A. fib, increased atenolol dose to 50 mg BID -Cardiology consulted and added cardizem PO today to avoid tachycardia induced cardiomyopathy, not recommending long-term anticoagulation given likely correctable cause of hyperthyroidism. -Will transfer to telemetry bed   This patients CHA2DS2-VASc Score and unadjusted Ischemic Stroke Rate (% per year) is equal to 0.6 % stroke rate/year from a score of 1 for female gender, on aspirin per cardiology. Above score calculated as 1 point each if present [CHF, HTN, DM, Vascular=MI/PAD/Aortic Plaque, Age if 65-74, or Female] Above score calculated as 2 points each if present [Age > 75, or Stroke/TIA/TE]  Hyperthyroidism -Reported weight loss, started as intentional weight loss with Hydroxycut but she lost over 100 pounds in the past 1 year. -TSH is 0.013, free T4 high at 5.17 total T3 high at 451. -Increase methimazole to 10 mg TID and atenolol 50 mg BID -[place] of neck revealed diffuse goiter without discrete nodules -Patient will need follow-up with endocrinologist as an outpatient with RAIU scan at that time     Acute bronchitis -Patient has significant cough, seems to be improving with Mucinex  -Started on levofloxacin, antitussives, mucolytics and oxygen as needed.  Dysphagia -Patient reported feeling choked all the time, she's been having difficulty with swallowing for the past 1-2 month. -Diffuse enlarged thyroid on neck US without discrete nodule, likely not large enough to be causing obstructive symptoms  swallowing much better now monitor  Chest pain -This is likely secondary to the A. fib with rapid ventricular response, patient also has some cough and pleuritic chest pain. -Cardiac enzymes negative, 12-lead EKG showed A. fib with no ischemic changes.  Hypokalemia and hypomagnesemia -K+ corrected with oral supplements  -Mag remains 1.6 despite repletion, cardiology ordered repeat 2g dose this AM, repeated.   Code Status: Full Code Family Communication: Plan discussed with the patient. Disposition Plan: Remains inpatient Diet: Diet Heart Room service appropriate?: Yes; Fluid consistency:: Thin  Consultants:  Cardiology  Procedures:  None   Antibiotics:  Levofloxacin (indicate start date, and stop date if known)   Objective: Filed Vitals:   05/10/15 0700 05/10/15 0738  BP:  145/32  Pulse: 90 85  Temp:    Resp: 19 24    Intake/Output Summary (Last 24 hours) at 05/10/15 0843 Last data filed at 05/10/15 0739  Gross per 24 hour  Intake   1090 ml  Output    850 ml  Net    240 ml   Filed Weights   05/07/15 2125 05/08/15 0214  Weight: 105 kg (231 lb 7.7 oz) 103.4 kg (227 lb 15.3 oz)    Exam: General: Alert and awake, oriented x3, not in any acute distress. HEENT:  anicteric sclera, pupils reactive to light and accommodation, EOMI CVS: Irregularly irregular, no murmurs.  Chest: clear to auscultation bilaterally, no wheezing, rales or rhonchi Abdomen: soft nontender, nondistended, normal bowel sounds, no organomegaly Extremities: no cyanosis, clubbing or edema  noted bilaterally Neuro: Cranial nerves II-XII intact, no focal neurological deficits  Data Reviewed: Basic Metabolic Panel:  Recent Labs Lab 05/07/15 1910 05/08/15 0341 05/09/15 0344 05/10/15 0340  NA 140 140 138 138  K 3.5 3.1* 4.0 4.0  CL 107 108 107 106  CO2 25 24 21* 27  GLUCOSE 105* 127* 107* 105*  BUN 11 10 9 9   CREATININE 0.56 0.45 0.50 0.46  CALCIUM 9.4 8.9 9.3 9.1  MG  --  1.4* 1.7 1.6*  PHOS  --  4.7*  --   --    Liver Function Tests:  Recent Labs Lab 05/07/15 1910 05/08/15 0341  AST 48* 34  ALT 35 29  ALKPHOS 115 93  BILITOT 0.9 0.9  PROT 6.9 5.9*  ALBUMIN 3.6 3.0*    Recent Labs Lab 05/07/15 1910  LIPASE 18   No results for input(s): AMMONIA in the last 168 hours. CBC:  Recent Labs Lab 05/07/15 1910 05/08/15 0341 05/09/15 0344 05/10/15 0340  WBC 6.2 4.8 3.7* 3.4*  HGB 10.1* 8.1* 8.6* 8.5*  HCT 32.0* 26.3* 27.9* 27.2*  MCV 81.0 80.7 81.8 81.2  PLT 151 119* 101* 98*   Cardiac Enzymes:  Recent Labs Lab 05/07/15 2259 05/08/15 0341 05/08/15 0915  TROPONINI 0.03 0.03 0.03   BNP (last 3 results)  Recent Labs  05/08/15 0203  BNP 213.0*    ProBNP (last 3 results) No results for input(s): PROBNP in the last 8760 hours.  CBG: No results for input(s): GLUCAP in the last 168 hours.  Micro Recent Results (from the past 240 hour(s))  MRSA PCR Screening     Status: None   Collection Time: 05/08/15  1:46 AM  Result Value Ref Range Status   MRSA by PCR NEGATIVE NEGATIVE Final    Comment:        The GeneXpert MRSA Assay (FDA approved for NASAL specimens only), is one component of a comprehensive MRSA colonization surveillance program. It is not intended to diagnose MRSA infection nor to guide or monitor treatment for MRSA infections.      Studies: US Soft Tissue Head/neck  05/09/2015  CLINICAL DATA:  Goiter. EXAM: THYROID ULTRASOUND TECHNIQUE: Ultrasound examination of the thyroid gland and adjacent soft tissues was  performed. COMPARISON:  None. FINDINGS: Right thyroid lobe Measurements: 6.4 x 3.3 x 3.3 cm. Thyroid parenchyma is slightly heterogeneous in appearance. No nodules visualized. Left thyroid lobe Measurements: 5.4 x 2.8 x 2.2 cm. Thyroid parenchyma is slightly heterogeneous in appearance. No nodules visualized. Isthmus Thickness: 1.1 cm.  No nodules visualized. Lymphadenopathy No significantly enlarged adenopathy is noted. IMPRESSION: Diffusely enlarged thyroid gland is noted consistent with goiter, with no dominant or discrete nodule or mass seen. Electronically Signed   By: Marijo Conception, M.D.   On: 05/09/2015 16:11    Scheduled Meds: . aspirin EC  81 mg Oral Daily  . atenolol  50 mg Oral BID  . enoxaparin (LOVENOX) injection  40 mg Subcutaneous Q24H  . guaiFENesin  1,200 mg Oral BID  . levofloxacin (LEVAQUIN) IV  750 mg Intravenous Q24H  . magnesium oxide  400 mg Oral BID  . magnesium sulfate 1 - 4 g bolus IVPB  2 g Intravenous Once  . methimazole  5 mg Oral  TID  . sodium chloride  3 mL Intravenous Q12H   Continuous Infusions:       Time spent: 35 minutes    Vornheder, Joellen Jersey PA-S wrote parts of the above note [facility] 05/10/2015, 8:43 AM  LOS: 3 days      Birdie Hopes Pager: [phone] 05/10/2015, 2:00 PM

## 2015-05-10 NOTE — Progress Notes (Signed)
Gave report to Raquel Sarna, Therapist, sports and all questions answered. Family at bedside and updated. All belongings given to family and pt transported via wheelchair.

## 2015-05-10 NOTE — Progress Notes (Signed)
Patient: Olivia Schmidt Minor / Admit Date: 05/07/2015 / Date of Encounter: 05/10/2015, 7:49 AM   Subjective: Feeling much better. Cough improved. No SOB. No awareness of elevated HR despite elevated rates overnight into this AM.  Objective: Telemetry: atrial fib rates currently 110-140, overnight sustaining 160s Physical Exam: Blood pressure 145/32, pulse 125, temperature 98.4 F (36.9 C), temperature source Oral, resp. rate 24, height 5\' 4"  (1.626 m), weight 227 lb 15.3 oz (103.4 kg), last menstrual period 09/14/2010, SpO2 100 %. General: Well developed, well nourished AAF in no acute distress. Head: Normocephalic, atraumatic, sclera non-icteric, no xanthomas, nares are without discharge. Neck: Negative for carotid bruits. JVP not elevated. Lungs: Clear bilaterally to auscultation without wheezes, rales, or rhonchi. Breathing is unlabored. Heart: Irregularly irregular rhythm, elevated rate, S1 S2 without murmurs, rubs, or gallops.  Abdomen: Soft, non-tender, non-distended with normoactive bowel sounds. No rebound/guarding. Extremities: No clubbing or cyanosis. No edema. Distal pedal pulses are 2+ and equal bilaterally. Neuro: Alert and oriented X 3. Moves all extremities spontaneously. Psych: Responds to questions appropriately with a normal affect.   Intake/Output Summary (Last 24 hours) at 05/10/15 0749 Last data filed at 05/10/15 0739  Gross per 24 hour  Intake   1210 ml  Output    850 ml  Net    360 ml    Inpatient Medications:  . aspirin EC  81 mg Oral Daily  . atenolol  50 mg Oral BID  . enoxaparin (LOVENOX) injection  40 mg Subcutaneous Q24H  . guaiFENesin  1,200 mg Oral BID  . levofloxacin (LEVAQUIN) IV  750 mg Intravenous Q24H  . magnesium oxide  400 mg Oral Daily  . methimazole  5 mg Oral TID  . sodium chloride  3 mL Intravenous Q12H       Infusions:    Labs:  Recent Labs  05/08/15 0341 05/09/15 0344 05/10/15 0340  NA 140 138 138  K 3.1* 4.0 4.0  CL 108  107 106  CO2 24 21* 27  GLUCOSE 127* 107* 105*  BUN 10 9 9   CREATININE 0.45 0.50 0.46  CALCIUM 8.9 9.3 9.1  MG 1.4* 1.7 1.6*  PHOS 4.7*  --   --     Recent Labs  05/07/15 1910 05/08/15 0341  AST 48* 34  ALT 35 29  ALKPHOS 115 93  BILITOT 0.9 0.9  PROT 6.9 5.9*  ALBUMIN 3.6 3.0*    Recent Labs  05/09/15 0344 05/10/15 0340  WBC 3.7* 3.4*  HGB 8.6* 8.5*  HCT 27.9* 27.2*  MCV 81.8 81.2  PLT 101* 98*    Recent Labs  05/07/15 2259 05/08/15 0341 05/08/15 0915  TROPONINI 0.03 0.03 0.03   Invalid input(s): POCBNP  Recent Labs  05/08/15 0341  HGBA1C 6.1*     Radiology/Studies:  Ct Angio Chest Pe W/cm &/or Wo Cm  05/07/2015  CLINICAL DATA:  Central chest pain. Vomiting. Tachycardia. Elevated D-dimer. Shortness of breath. EXAM: CT ANGIOGRAPHY CHEST WITH CONTRAST TECHNIQUE: Multidetector CT imaging of the chest was performed using the standard protocol during bolus administration of intravenous contrast. Multiplanar CT image reconstructions and MIPs were obtained to evaluate the vascular anatomy. CONTRAST:  110mL OMNIPAQUE IOHEXOL 350 MG/ML SOLN COMPARISON:  07/06/2013 FINDINGS: Technically adequate study with moderately good opacification of the central and proximal segmental pulmonary arteries. Limited opacification of the distal pulmonary arteries. No filling defects demonstrated. No evidence of significant pulmonary embolus. Since the previous study, there has been interval development of small pericardial effusion. Increased density  in the anterior mediastinum may represent thymic tissue. Normal heart size. Normal caliber thoracic aorta. No significant lymphadenopathy in the chest. Esophagus is decompressed. Evaluation of lungs is limited due to respiratory motion artifact. No focal consolidation or airspace disease. Small focal ground-glass nodular infiltrate in the left apex measuring about 1 cm diameter was not present on the previous study and may represent inflammatory  focus. Follow-up examination in 3 months is suggested. No pleural effusions. No pneumothorax. Included portions of the upper abdominal organs are grossly unremarkable. Degenerative changes in the spine. No destructive bone lesions. Prominent degenerative changes at the sternomanubrial joint. Review of the MIP images confirms the above findings. IMPRESSION: No evidence of significant pulmonary embolus. Peripheral pulmonary arteries are not well opacified, limiting evaluation. 1 cm ground-glass nodular infiltrate demonstrated in the left apex, not present on previous study. This could represent a small inflammatory focus. Follow-up examination in 3 months is suggested. Electronically Signed   By: Lucienne Capers M.D.   On: 05/07/2015 22:49   US Soft Tissue Head/neck  05/09/2015  CLINICAL DATA:  Goiter. EXAM: THYROID ULTRASOUND TECHNIQUE: Ultrasound examination of the thyroid gland and adjacent soft tissues was performed. COMPARISON:  None. FINDINGS: Right thyroid lobe Measurements: 6.4 x 3.3 x 3.3 cm. Thyroid parenchyma is slightly heterogeneous in appearance. No nodules visualized. Left thyroid lobe Measurements: 5.4 x 2.8 x 2.2 cm. Thyroid parenchyma is slightly heterogeneous in appearance. No nodules visualized. Isthmus Thickness: 1.1 cm.  No nodules visualized. Lymphadenopathy No significantly enlarged adenopathy is noted. IMPRESSION: Diffusely enlarged thyroid gland is noted consistent with goiter, with no dominant or discrete nodule or mass seen. Electronically Signed   By: Marijo Conception, M.D.   On: 05/09/2015 16:11   Dg Chest Port 1 View  05/07/2015  CLINICAL DATA:  Central chest pain for 1 week, vomiting for 1 month EXAM: PORTABLE CHEST 1 VIEW COMPARISON:  07/06/2013 FINDINGS: Limited AP portable radiograph. Mild cardiac enlargement stable. Vascular pattern normal. Right lung appears to be clear. Difficult to evaluate retrocardiac portion of left lower lobe of left lung appears clear as well.  IMPRESSION: Limited study showing no acute abnormality Electronically Signed   By: Skipper Cliche M.D.   On: 05/07/2015 19:37   Ct Renal Stone Study  05/07/2015  CLINICAL DATA:  Abdominal pain, vomiting, history of kidney stone EXAM: CT ABDOMEN AND PELVIS WITHOUT CONTRAST TECHNIQUE: Multidetector CT imaging of the abdomen and pelvis was performed following the standard protocol without IV contrast. COMPARISON:  12/22/2014 FINDINGS: Lung bases are unremarkable. There are streak artifacts from patient's large body habitus. Motion artifacts are noted. Unenhanced liver shows no biliary ductal dilatation. No calcified gallstones are noted within gallbladder. Unenhanced pancreas, spleen and adrenal glands are unremarkable. Unenhanced kidneys are symmetrical in size. There is no nephrolithiasis. No small bowel obstruction. No ascites or free air. No adenopathy. No pericecal inflammation. Normal appendix partially visualized in axial image 54. No calcified ureteral calculi. There is a right iliac chain lymph node axial image 49 measures 1.2 by 0.8 cm probable reactive. No distal colonic obstruction. The patient is status post hysterectomy. The urinary bladder is under distended. Again noted bilateral inguinal lymph nodes. The largest right inguinal lymph node measures 1.6 by 1.5 cm. The largest left inguinal lymph node measures 1.6 by 1.4 cm. No destructive bony lesions are noted within pelvis. Sagittal images of the spine shows mild degenerative changes thoracolumbar spine. Right pelvic sidewall lymph node measures 1.8 x 1.3 cm. Left pelvic sidewall lymph  node measures 1 by 1.4 cm. IMPRESSION: 1. There is no nephrolithiasis.  No hydronephrosis or hydroureter. 2. No pericecal inflammation.  Normal appendix. 3. No calcified ureteral calculi. 4. No small bowel obstruction. 5. Mild enlarged pelvic and bilateral inguinal lymph nodes. This may be reactive or due to lymphoproliferative disease. Electronically Signed   By:  Lahoma Crocker M.D.   On: 05/07/2015 20:25   TTE: 05/08/2015 Left ventricle: The cavity size was normal. Wall thickness was increased in a pattern of mild LVH. Systolic function was normal. The estimated ejection fraction was in the range of 50% to 55%. Wall motion was normal; there were no regional wall motion abnormalities. Left ventricular diastolic function parameters were normal. - Aortic valve: Valve area (Vmax): 1.62 cm^2. - Mitral valve: There was mild regurgitation. - Right atrium: The atrium was mildly dilated. - Pericardium, extracardiac: A trivial pericardial effusion was identified posterior to the heart.     Assessment and Plan   89F with h/o uterine fibroids, obesity (taking hydroxycut) who was admitted with n/v x1 month found to have atrial fib with RVR and newly recognized anemia and hyperthyroidism. 2D echo 05/08/15: mild LVH, EF 50-55%, mild MR, trivial pericardial effusion.  1. Paroxysmal atrial fib/atrial flutter - likely largely explained by new diagnosis of hyperthyroidism. Echo as above. CHADSVASC only 1 for female. During admission she has gone back and forth between sinus tach, AF, and atrial flutter. I was going to adjust meds given elevated HR, but IM has already done so for the AM -- Dr. Hartford Poli increased atenolol to 50mg  bid and given dose early this AM just a few minutes ago. Will follow for clinical response. Hopeful that paroxysms will improve as hyperthyroid is treated.  2. Hyperthyroidism - per IM. US neck c/w goiter.  3. Anemia/thrombocytopenia/leukopenia - no obvous bleeding reported, per internal medicine.  4. Cough/chills - felt 2/2 acute bronchitis, now on abx. 1cm nodular infiltrate noted on CTA with recommendation for f/u in 3 months. This will need to be arranged per primary team at discharge.   5. Hypomagnesemia/hypokalemia - s/p repletion. Mg level still 1.6. Will give another 2g this AM and increase MagOx to 400mg   BID.  Signed, Melina Copa PA-C Pager: [phone]  The patient was seen, examined and discussed with Melina Copa, PA-C and I agree with the above.   55 year old female with paroxysmal atrial fib with RVR and atrial flutter and newly recognized anemia and hyperthyroidism. In and out of a-fib with RVR and sinus tachycardia, started on atenolol 50 mg po this yesterday, LVEF 50-55% with normal size of the left atrium. I would add diltiaem CD 120 mg po daily to avoid tachycardia induced cardiomyopathy, I would expect improvement in her tachycardia with treatment of hyperthyroidism.  Started on iv levofloxacin for acute bronchitis. On methimazole for hyperthyroidism.  CHA2DS2-VASc Score 1, on ASA only.  Dorothy Spark 05/10/2015

## 2015-05-11 DIAGNOSIS — R079 Chest pain, unspecified: Secondary | ICD-10-CM

## 2015-05-11 DIAGNOSIS — R112 Nausea with vomiting, unspecified: Secondary | ICD-10-CM

## 2015-05-11 LAB — CBC
HCT: 27.5 % — ABNORMAL LOW (ref 36.0–46.0)
HEMOGLOBIN: 8.3 g/dL — AB (ref 12.0–15.0)
MCH: 24.1 pg — AB (ref 26.0–34.0)
MCHC: 30.2 g/dL (ref 30.0–36.0)
MCV: 79.7 fL (ref 78.0–100.0)
PLATELETS: 105 10*3/uL — AB (ref 150–400)
RBC: 3.45 MIL/uL — AB (ref 3.87–5.11)
RDW: 14.4 % (ref 11.5–15.5)
WBC: 3.5 10*3/uL — ABNORMAL LOW (ref 4.0–10.5)

## 2015-05-11 LAB — BASIC METABOLIC PANEL
ANION GAP: 7 (ref 5–15)
BUN: 10 mg/dL (ref 6–20)
CO2: 28 mmol/L (ref 22–32)
Calcium: 9.2 mg/dL (ref 8.9–10.3)
Chloride: 107 mmol/L (ref 101–111)
Creatinine, Ser: 0.53 mg/dL (ref 0.44–1.00)
Glucose, Bld: 89 mg/dL (ref 65–99)
POTASSIUM: 3.8 mmol/L (ref 3.5–5.1)
SODIUM: 142 mmol/L (ref 135–145)

## 2015-05-11 LAB — MAGNESIUM: MAGNESIUM: 1.9 mg/dL (ref 1.7–2.4)

## 2015-05-11 MED ORDER — ATENOLOL 50 MG PO TABS: 50.0000 mg | ORAL_TABLET | Freq: Two times a day (BID) | ORAL | Status: DC

## 2015-05-11 MED ORDER — METHIMAZOLE 10 MG PO TABS: 10.0000 mg | ORAL_TABLET | Freq: Three times a day (TID) | ORAL | Status: DC

## 2015-05-11 MED ORDER — DILTIAZEM HCL ER COATED BEADS 120 MG PO CP24: 120.0000 mg | ORAL_CAPSULE | Freq: Every day | ORAL | Status: DC

## 2015-05-11 MED ORDER — LEVOFLOXACIN 500 MG PO TABS: 500.0000 mg | ORAL_TABLET | Freq: Every day | ORAL | Status: DC

## 2015-05-11 MED ORDER — GUAIFENESIN ER 600 MG PO TB12: 1200.0000 mg | ORAL_TABLET | Freq: Two times a day (BID) | ORAL | Status: DC

## 2015-05-11 MED ORDER — HYDROCORTISONE 1 % EX CREA: TOPICAL_CREAM | Freq: Two times a day (BID) | CUTANEOUS | Status: DC

## 2015-05-11 MED ORDER — MAGNESIUM OXIDE 400 (241.3 MG) MG PO TABS: 400.0000 mg | ORAL_TABLET | Freq: Two times a day (BID) | ORAL | Status: DC

## 2015-05-11 MED ORDER — ASPIRIN 81 MG PO TBEC: 81.0000 mg | DELAYED_RELEASE_TABLET | Freq: Every day | ORAL | Status: DC

## 2015-05-11 NOTE — Discharge Summary (Signed)
Physician Discharge Summary  Olivia Schmidt Minor [id number] DOB: 06-Sep-1960 DOA: 05/07/2015  PCP: Lance Bosch, NP  Admit date: 05/07/2015 Discharge date: 05/11/2015  Time spent: 65 minutes  Recommendations for Outpatient Follow-up:  1. Follow-up with Lance Bosch, NP in 1 week. On follow-up patient will need to be referred to an endocrinologist for further management of her hyperthyroidism as well as further evaluation. Patient will need a basic metabolic profile done to follow-up on electrolytes and renal function. Patient is a magnesium checked. 2. Patient will follow-up with cardiology as outpatient.   Discharge Diagnoses:  Principal Problem:   Atrial fibrillation with RVR (HCC) Active Problems:   Chest pain   Anemia   Hyperthyroidism   Emesis   Acute bronchitis   Hypokalemia   Hypomagnesemia   PAF (paroxysmal atrial fibrillation) (HCC)   Thrombocytopenia (HCC)   Atrial fibrillation with rapid ventricular response ([place])   Goiter   Discharge Condition: Stable and improved  Diet recommendation: Heart healthy  Filed Weights   05/07/15 2125 05/08/15 0214  Weight: 105 kg (231 lb 7.7 oz) 103.4 kg (227 lb 15.3 oz)    History of present illness:  Per Dr Hollace Kinnier E Minor is a 54 y.o. female   has a past medical history of Fibroids.   Presented with  Chest pain in the center of her chest reporting some nausea and vomiting. Patient reported that past 1 month she hasn't been feeling well she's been having intermittent coughing productive of white phlegm and bronchitis-like symptoms. She reported occasional wheezing. She denied any fevers. No leg swelling. No travel history. She had been losing weight and had lost up to 70 pounds over past 4 months. Reported some hair loss. Been having nausea and vomiting which has been persistent for at least 2 weeks, sensation of her heart beating racing. in the emergency department she was noted to be tachycardic up to 180 was  found to be in atrial fibrillation with RVR. Patient was given Cardizem IV 10 many grams 2 with some improvement started on Cardizem drip but continued to still be tachycardic she was given metoprolol 10 mg IV and bolus with total of 1.5 L. Despite this she remained tachycardic. Cardiology consult had been called and patient was recommended to be started on Lovenox for possible cardioversion the next day, and to be admitted to stepdown unit by medicine. [facility] department she was found to have elevated elevated d-dimer 2.4 as well as low TSH at 0.013  Hospitalist was called for admission for atrial fibrillation with RVR, dehydration, hyperthyroidism   Hospital Course:  #1 atrial fibrillation with RVR Patient was admitted with a 2 fibrillation with RVR with heart rates in the 180s and EKG consistent with A. fib. Patient was admitted placing the step down unit placed on the Cardizem drip and cardiology consulted. Patient was going in and out of A. fib and subsequently transitioned to oral atenolol. Patient was seen in consultation by cardiology and oral Cardizem was added to avoid tachycardia induced cardiomyopathy. Long-term anticoagulation was not recommended as was felt patient's A. fib was likely secondary to hyperthyroidism. Patient was followed by cardiology throughout the hospitalization. Patient remained chest pain-free. Patient denied any further shortness of breath. Patient CHADSVASC score was 68 female and she was on aspirin. Patient's heart rate remained in the 80s. And was recommended to continue atenolol and diltiazem for rate control as well as aspirin and patient will follow-up with cardiology as outpatient. This was discharged in stable  and improved condition.  #2 hyperthyroidism On admission Patient had come in with complaints of weight loss which was intentional with the use of Hydroxycut but lost of 100 pounds over the past year. TSH obtained was 0.013. Free T4 was elevated at 5.17.  Total T3 was elevated at 451. Patient was started on methimazole and dose increased to 10 mg 3 times daily as well as atenolol for rate control. Atenolol doses were adjusted for better rate control. Ultrasound of the neck revealed diffuse goiter without discrete nodules or masses. Patient improved clinically and patient will be discharged home in stable and improved condition. Patient will need to follow-up with her endocrinologist as outpatient for further evaluation and management with a RAIU  scan.  #3 acute bronchitis Patient had presented with a productive cough was placed on Levaquin, antitussives, mucolytics and oxygen with clinical improvement. Patient be discharged home on a few more days of oral Levaquin to complete a course of antibiotic therapy.  #4 dysphagia Patient had some complaints of feeling choked at times with difficulty swallowing over the past 1-2 month prior to admission. May have been secondary to goiter noted on ultrasound of the neck which showed a diffuse thyroid without discrete nodules. Patient's dysphagia improved and she was tolerating a diet by day of discharge.  #5 chest pain Patient had some complaints of chest pain which are felt to be secondary to A. fib with RVR as well as due to her bronchitis as it was related to a cough with some pleurisy. Cardiac enzymes which was cycled were negative 3. EKG showed A. fib with no ischemic changes. 2-D echo which was done had a EF of 50-55% with no wall motion abnormalities and a mildly dilated right atrium with mild mitral valvular regurgitation. Patient's chest pain improved and had resolved by day of discharge.  #6 hypokalemia/hypomagnesemia Repleted.   Procedures:  2-D echo 05/08/2015  Ultrasound of thyroid 05/09/2015  Chest x-ray 05/07/2015  CT angiogram chest 05/07/2015  CT stone protocol 05/07/2015  Consultations:  Cardiology: Dr. Claiborne Billings 05/07/2015  Discharge Exam: Filed Vitals:   05/11/15 0517 05/11/15  0956  BP: 120/41 113/50  Pulse: 85 83  Temp: 98.1 F (36.7 C)   Resp: 18     General: NAD Cardiovascular: RRR Respiratory: CTAB  Discharge Instructions   Discharge Instructions    Diet - low sodium heart healthy    Complete by:  As directed      Discharge instructions    Complete by:  As directed   Follow up with Lance Bosch, NP IN 1 WEEK. Follow up with cardiology as outpatient.     Increase activity slowly    Complete by:  As directed           Current Discharge Medication List    START taking these medications   Details  aspirin EC 81 MG EC tablet Take 1 tablet (81 mg total) by mouth daily.    atenolol (TENORMIN) 50 MG tablet Take 1 tablet (50 mg total) by mouth 2 (two) times daily. Qty: 62 tablet, Refills: 0    diltiazem (CARDIZEM CD) 120 MG 24 hr capsule Take 1 capsule (120 mg total) by mouth daily. Qty: 30 capsule, Refills: 0    guaiFENesin (MUCINEX) 600 MG 12 hr tablet Take 2 tablets (1,200 mg total) by mouth 2 (two) times daily. Take for 5 days then stop. Qty: 20 tablet, Refills: 0    hydrocortisone cream 1 % Apply topically 2 (two) times daily.  Qty: 30 g, Refills: 0    levofloxacin (LEVAQUIN) 500 MG tablet Take 1 tablet (500 mg total) by mouth daily. Take for 5 days then stop. Qty: 5 tablet, Refills: 0    magnesium oxide (MAG-OX) 400 (241.3 MG) MG tablet Take 1 tablet (400 mg total) by mouth 2 (two) times daily. Qty: 60 tablet, Refills: 0    methimazole (TAPAZOLE) 10 MG tablet Take 1 tablet (10 mg total) by mouth 3 (three) times daily. Qty: 120 tablet, Refills: 0      CONTINUE these medications which have NOT CHANGED   Details  acetaminophen (TYLENOL) 500 MG tablet Take 1,000 mg by mouth every 6 (six) hours as needed for moderate pain or headache.     aspirin-acetaminophen-caffeine (EXCEDRIN MIGRAINE) 250-250-65 MG per tablet Take 1-2 tablets by mouth every 6 (six) hours as needed for headache.     Naproxen Sod-Diphenhydramine (ALEVE PM) 220-25  MG TABS Take 2 tablets by mouth daily as needed (pain).     triamcinolone cream (KENALOG) 0.1 % Apply 1 application topically 2 (two) times daily. Qty: 45 g, Refills: 1   Associated Diagnoses: Dyshidrotic eczema    acetaminophen-codeine (TYLENOL #3) 300-30 MG per tablet Take 1-2 tablets by mouth every 8 (eight) hours as needed for moderate pain. Qty: 60 tablet, Refills: 0   Associated Diagnoses: Kidney stone    ondansetron (ZOFRAN ODT) 8 MG disintegrating tablet Take 1 tablet (8 mg total) by mouth every 8 (eight) hours as needed for nausea or vomiting. Qty: 30 tablet, Refills: 0   Associated Diagnoses: Kidney stone    tamsulosin (FLOMAX) 0.4 MG CAPS capsule Take 1 capsule (0.4 mg total) by mouth daily. To help pass stone Qty: 14 capsule, Refills: 0   Associated Diagnoses: Kidney stone      STOP taking these medications     amoxicillin (AMOXIL) 500 MG capsule      oxyCODONE-acetaminophen (PERCOCET/ROXICET) 5-325 MG per tablet      sulfamethoxazole-trimethoprim (BACTRIM DS,SEPTRA DS) 800-160 MG tablet        No Known Allergies Follow-up Information    Follow up with SIMMONS, BRITTAINY, PA-C.   Specialties:  Cardiology, Radiology   Why:  [facility] - 05/26/15 at [place] is one of our cardiology PAs.   Contact information:   [address] [place] [address] [phone]       Follow up with Lance Bosch, NP. Schedule an appointment as soon as possible for a visit in 1 week.   Specialty:  Internal Medicine   Why:  f/u with PCP in 1-2 weeks.   Contact information:   [place] [address] [phone]        The results of significant diagnostics from this hospitalization (including imaging, microbiology, ancillary and laboratory) are listed below for reference.    Significant Diagnostic Studies: Ct Angio Chest Pe W/cm &/or Wo Cm  05/07/2015  CLINICAL DATA:  Central chest pain. Vomiting. Tachycardia. Elevated D-dimer.  Shortness of breath. EXAM: CT ANGIOGRAPHY CHEST WITH CONTRAST TECHNIQUE: Multidetector CT imaging of the chest was performed using the standard protocol during bolus administration of intravenous contrast. Multiplanar CT image reconstructions and MIPs were obtained to evaluate the vascular anatomy. CONTRAST:  14mL OMNIPAQUE IOHEXOL 350 MG/ML SOLN COMPARISON:  07/06/2013 FINDINGS: Technically adequate study with moderately good opacification of the central and proximal segmental pulmonary arteries. Limited opacification of the distal pulmonary arteries. No filling defects demonstrated. No evidence of significant pulmonary embolus.  Since the previous study, there has been interval development of small pericardial effusion. Increased density in the anterior mediastinum may represent thymic tissue. Normal heart size. Normal caliber thoracic aorta. No significant lymphadenopathy in the chest. Esophagus is decompressed. Evaluation of lungs is limited due to respiratory motion artifact. No focal consolidation or airspace disease. Small focal ground-glass nodular infiltrate in the left apex measuring about 1 cm diameter was not present on the previous study and may represent inflammatory focus. Follow-up examination in 3 months is suggested. No pleural effusions. No pneumothorax. Included portions of the upper abdominal organs are grossly unremarkable. Degenerative changes in the spine. No destructive bone lesions. Prominent degenerative changes at the sternomanubrial joint. Review of the MIP images confirms the above findings. IMPRESSION: No evidence of significant pulmonary embolus. Peripheral pulmonary arteries are not well opacified, limiting evaluation. 1 cm ground-glass nodular infiltrate demonstrated in the left apex, not present on previous study. This could represent a small inflammatory focus. Follow-up examination in 3 months is suggested. Electronically Signed   By: Lucienne Capers M.D.   On: 05/07/2015 22:49    US Soft Tissue Head/neck  05/09/2015  CLINICAL DATA:  Goiter. EXAM: THYROID ULTRASOUND TECHNIQUE: Ultrasound examination of the thyroid gland and adjacent soft tissues was performed. COMPARISON:  None. FINDINGS: Right thyroid lobe Measurements: 6.4 x 3.3 x 3.3 cm. Thyroid parenchyma is slightly heterogeneous in appearance. No nodules visualized. Left thyroid lobe Measurements: 5.4 x 2.8 x 2.2 cm. Thyroid parenchyma is slightly heterogeneous in appearance. No nodules visualized. Isthmus Thickness: 1.1 cm.  No nodules visualized. Lymphadenopathy No significantly enlarged adenopathy is noted. IMPRESSION: Diffusely enlarged thyroid gland is noted consistent with goiter, with no dominant or discrete nodule or mass seen. Electronically Signed   By: Marijo Conception, M.D.   On: 05/09/2015 16:11   Dg Chest Port 1 View  05/07/2015  CLINICAL DATA:  Central chest pain for 1 week, vomiting for 1 month EXAM: PORTABLE CHEST 1 VIEW COMPARISON:  07/06/2013 FINDINGS: Limited AP portable radiograph. Mild cardiac enlargement stable. Vascular pattern normal. Right lung appears to be clear. Difficult to evaluate retrocardiac portion of left lower lobe of left lung appears clear as well. IMPRESSION: Limited study showing no acute abnormality Electronically Signed   By: Skipper Cliche M.D.   On: 05/07/2015 19:37   Ct Renal Stone Study  05/07/2015  CLINICAL DATA:  Abdominal pain, vomiting, history of kidney stone EXAM: CT ABDOMEN AND PELVIS WITHOUT CONTRAST TECHNIQUE: Multidetector CT imaging of the abdomen and pelvis was performed following the standard protocol without IV contrast. COMPARISON:  12/22/2014 FINDINGS: Lung bases are unremarkable. There are streak artifacts from patient's large body habitus. Motion artifacts are noted. Unenhanced liver shows no biliary ductal dilatation. No calcified gallstones are noted within gallbladder. Unenhanced pancreas, spleen and adrenal glands are unremarkable. Unenhanced kidneys are  symmetrical in size. There is no nephrolithiasis. No small bowel obstruction. No ascites or free air. No adenopathy. No pericecal inflammation. Normal appendix partially visualized in axial image 54. No calcified ureteral calculi. There is a right iliac chain lymph node axial image 49 measures 1.2 by 0.8 cm probable reactive. No distal colonic obstruction. The patient is status post hysterectomy. The urinary bladder is under distended. Again noted bilateral inguinal lymph nodes. The largest right inguinal lymph node measures 1.6 by 1.5 cm. The largest left inguinal lymph node measures 1.6 by 1.4 cm. No destructive bony lesions are noted within pelvis. Sagittal images of the spine shows mild degenerative changes thoracolumbar  spine. Right pelvic sidewall lymph node measures 1.8 x 1.3 cm. Left pelvic sidewall lymph node measures 1 by 1.4 cm. IMPRESSION: 1. There is no nephrolithiasis.  No hydronephrosis or hydroureter. 2. No pericecal inflammation.  Normal appendix. 3. No calcified ureteral calculi. 4. No small bowel obstruction. 5. Mild enlarged pelvic and bilateral inguinal lymph nodes. This may be reactive or due to lymphoproliferative disease. Electronically Signed   By: Lahoma Crocker M.D.   On: 05/07/2015 20:25    Microbiology: Recent Results (from the past 240 hour(s))  MRSA PCR Screening     Status: None   Collection Time: 05/08/15  1:46 AM  Result Value Ref Range Status   MRSA by PCR NEGATIVE NEGATIVE Final    Comment:        The GeneXpert MRSA Assay (FDA approved for NASAL specimens only), is one component of a comprehensive MRSA colonization surveillance program. It is not intended to diagnose MRSA infection nor to guide or monitor treatment for MRSA infections.      Labs: Basic Metabolic Panel:  Recent Labs Lab 05/07/15 1910 05/08/15 0341 05/09/15 0344 05/10/15 0340 05/11/15 0517  NA 140 140 138 138 142  K 3.5 3.1* 4.0 4.0 3.8  CL 107 108 107 106 107  CO2 25 24 21* 27 28   GLUCOSE 105* 127* 107* 105* 89  BUN 11 10 9 9 10   CREATININE 0.56 0.45 0.50 0.46 0.53  CALCIUM 9.4 8.9 9.3 9.1 9.2  MG  --  1.4* 1.7 1.6* 1.9  PHOS  --  4.7*  --   --   --    Liver Function Tests:  Recent Labs Lab 05/07/15 1910 05/08/15 0341  AST 48* 34  ALT 35 29  ALKPHOS 115 93  BILITOT 0.9 0.9  PROT 6.9 5.9*  ALBUMIN 3.6 3.0*    Recent Labs Lab 05/07/15 1910  LIPASE 18   No results for input(s): AMMONIA in the last 168 hours. CBC:  Recent Labs Lab 05/07/15 1910 05/08/15 0341 05/09/15 0344 05/10/15 0340 05/11/15 0517  WBC 6.2 4.8 3.7* 3.4* 3.5*  HGB 10.1* 8.1* 8.6* 8.5* 8.3*  HCT 32.0* 26.3* 27.9* 27.2* 27.5*  MCV 81.0 80.7 81.8 81.2 79.7  PLT 151 119* 101* 98* 105*   Cardiac Enzymes:  Recent Labs Lab 05/07/15 2259 05/08/15 0341 05/08/15 0915  TROPONINI 0.03 0.03 0.03   BNP: BNP (last 3 results)  Recent Labs  05/08/15 0203  BNP 213.0*    ProBNP (last 3 results) No results for input(s): PROBNP in the last 8760 hours.  CBG: No results for input(s): GLUCAP in the last 168 hours.     SignedIrine Seal  MD [facility] 05/11/2015, 3:46 PM

## 2015-05-11 NOTE — Care Management Note (Signed)
Case Management Note  Patient Details  Name: OTIS TERNES MRN: [id number] Date of Birth: 01-19-1961  Subjective/Objective:                    Action/Plan:d/c home no needs or orders.   Expected Discharge Date:                 Expected Discharge Plan:  Home/Self Care  In-House Referral:  NA  Discharge planning Services  CM Consult  Post Acute Care Choice:  NA Choice offered to:  NA  DME Arranged:    DME Agency:     HH Arranged:    HH Agency:     Status of Service:  Completed, signed off  Medicare Important Message Given:    Date Medicare IM Given:    Medicare IM give by:    Date Additional Medicare IM Given:    Additional Medicare Important Message give by:     If discussed at [place] of Stay Meetings, dates discussed:    Additional Comments:  Dessa Phi, RN 05/11/2015, 4:11 PM

## 2015-05-11 NOTE — Progress Notes (Signed)
Patient: Olivia Schmidt / Admit Date: 05/07/2015 / Date of Encounter: 05/11/2015, 8:59 AM   Subjective: Feeling better. No CP. No SOB. Coughing significantly improved.   Objective: Telemetry: NSR since yesterday evening Physical Exam: Blood pressure 120/41, pulse 85, temperature 98.1 F (36.7 C), temperature source Oral, resp. rate 18, height 5\' 4"  (1.626 m), weight 227 lb 15.3 oz (103.4 kg), last menstrual period 09/14/2010, SpO2 100 %. General: Well developed, well nourished AAF in no acute distress. Head: Normocephalic, atraumatic, sclera non-icteric, no xanthomas, nares are without discharge. Neck: Negative for carotid bruits. JVP not elevated. Lungs: Clear bilaterally to auscultation without wheezes, rales, or rhonchi. Breathing is unlabored. Heart: RRR, S1 S2. Very soft SEM. Abdomen: Soft, non-tender, non-distended with normoactive bowel sounds. No rebound/guarding. Extremities: No clubbing or cyanosis. No edema. Distal pedal pulses are 2+ and equal bilaterally. Neuro: Alert and oriented X 3. Moves all extremities spontaneously. Psych: Responds to questions appropriately with a normal affect.   Intake/Output Summary (Last 24 hours) at 05/11/15 0859 Last data filed at 05/11/15 0700  Gross per 24 hour  Intake    650 ml  Output      0 ml  Net    650 ml    Inpatient Medications:  . aspirin EC  81 mg Oral Daily  . atenolol  50 mg Oral BID  . diltiazem  120 mg Oral Daily  . enoxaparin (LOVENOX) injection  40 mg Subcutaneous Q24H  . guaiFENesin  1,200 mg Oral BID  . hydrocortisone cream   Topical BID  . levofloxacin (LEVAQUIN) IV  750 mg Intravenous Q24H  . magnesium oxide  400 mg Oral BID  . methimazole  10 mg Oral TID  . sodium chloride  3 mL Intravenous Q12H   Infusions:    Labs:  Recent Labs  05/10/15 0340 05/11/15 0517  NA 138 142  K 4.0 3.8  CL 106 107  CO2 27 28  GLUCOSE 105* 89  BUN 9 10  CREATININE 0.46 0.53  CALCIUM 9.1 9.2  MG 1.6* 1.9   No  results for input(s): AST, ALT, ALKPHOS, BILITOT, PROT, ALBUMIN in the last 72 hours.  Recent Labs  05/10/15 0340 05/11/15 0517  WBC 3.4* 3.5*  HGB 8.5* 8.3*  HCT 27.2* 27.5*  MCV 81.2 79.7  PLT 98* 105*    Recent Labs  05/08/15 0915  TROPONINI 0.03   Invalid input(s): POCBNP No results for input(s): HGBA1C in the last 72 hours.   Radiology/Studies:  Ct Angio Chest Pe W/cm &/or Wo Cm  05/07/2015  CLINICAL DATA:  Central chest pain. Vomiting. Tachycardia. Elevated D-dimer. Shortness of breath. EXAM: CT ANGIOGRAPHY CHEST WITH CONTRAST TECHNIQUE: Multidetector CT imaging of the chest was performed using the standard protocol during bolus administration of intravenous contrast. Multiplanar CT image reconstructions and MIPs were obtained to evaluate the vascular anatomy. CONTRAST:  175mL OMNIPAQUE IOHEXOL 350 MG/ML SOLN COMPARISON:  07/06/2013 FINDINGS: Technically adequate study with moderately good opacification of the central and proximal segmental pulmonary arteries. Limited opacification of the distal pulmonary arteries. No filling defects demonstrated. No evidence of significant pulmonary embolus. Since the previous study, there has been interval development of small pericardial effusion. Increased density in the anterior mediastinum may represent thymic tissue. Normal heart size. Normal caliber thoracic aorta. No significant lymphadenopathy in the chest. Esophagus is decompressed. Evaluation of lungs is limited due to respiratory motion artifact. No focal consolidation or airspace disease. Small focal ground-glass nodular infiltrate in the left apex measuring about  1 cm diameter was not present on the previous study and may represent inflammatory focus. Follow-up examination in 3 months is suggested. No pleural effusions. No pneumothorax. Included portions of the upper abdominal organs are grossly unremarkable. Degenerative changes in the spine. No destructive bone lesions. Prominent  degenerative changes at the sternomanubrial joint. Review of the MIP images confirms the above findings. IMPRESSION: No evidence of significant pulmonary embolus. Peripheral pulmonary arteries are not well opacified, limiting evaluation. 1 cm ground-glass nodular infiltrate demonstrated in the left apex, not present on previous study. This could represent a small inflammatory focus. Follow-up examination in 3 months is suggested. Electronically Signed   By: Lucienne Capers M.D.   On: 05/07/2015 22:49   US Soft Tissue Head/neck  05/09/2015  CLINICAL DATA:  Goiter. EXAM: THYROID ULTRASOUND TECHNIQUE: Ultrasound examination of the thyroid gland and adjacent soft tissues was performed. COMPARISON:  None. FINDINGS: Right thyroid lobe Measurements: 6.4 x 3.3 x 3.3 cm. Thyroid parenchyma is slightly heterogeneous in appearance. No nodules visualized. Left thyroid lobe Measurements: 5.4 x 2.8 x 2.2 cm. Thyroid parenchyma is slightly heterogeneous in appearance. No nodules visualized. Isthmus Thickness: 1.1 cm.  No nodules visualized. Lymphadenopathy No significantly enlarged adenopathy is noted. IMPRESSION: Diffusely enlarged thyroid gland is noted consistent with goiter, with no dominant or discrete nodule or mass seen. Electronically Signed   By: Marijo Conception, M.D.   On: 05/09/2015 16:11   Dg Chest Port 1 View  05/07/2015  CLINICAL DATA:  Central chest pain for 1 week, vomiting for 1 month EXAM: PORTABLE CHEST 1 VIEW COMPARISON:  07/06/2013 FINDINGS: Limited AP portable radiograph. Mild cardiac enlargement stable. Vascular pattern normal. Right lung appears to be clear. Difficult to evaluate retrocardiac portion of left lower lobe of left lung appears clear as well. IMPRESSION: Limited study showing no acute abnormality Electronically Signed   By: Skipper Cliche M.D.   On: 05/07/2015 19:37   Ct Renal Stone Study  05/07/2015  CLINICAL DATA:  Abdominal pain, vomiting, history of kidney stone EXAM: CT  ABDOMEN AND PELVIS WITHOUT CONTRAST TECHNIQUE: Multidetector CT imaging of the abdomen and pelvis was performed following the standard protocol without IV contrast. COMPARISON:  12/22/2014 FINDINGS: Lung bases are unremarkable. There are streak artifacts from patient's large body habitus. Motion artifacts are noted. Unenhanced liver shows no biliary ductal dilatation. No calcified gallstones are noted within gallbladder. Unenhanced pancreas, spleen and adrenal glands are unremarkable. Unenhanced kidneys are symmetrical in size. There is no nephrolithiasis. No small bowel obstruction. No ascites or free air. No adenopathy. No pericecal inflammation. Normal appendix partially visualized in axial image 54. No calcified ureteral calculi. There is a right iliac chain lymph node axial image 49 measures 1.2 by 0.8 cm probable reactive. No distal colonic obstruction. The patient is status post hysterectomy. The urinary bladder is under distended. Again noted bilateral inguinal lymph nodes. The largest right inguinal lymph node measures 1.6 by 1.5 cm. The largest left inguinal lymph node measures 1.6 by 1.4 cm. No destructive bony lesions are noted within pelvis. Sagittal images of the spine shows mild degenerative changes thoracolumbar spine. Right pelvic sidewall lymph node measures 1.8 x 1.3 cm. Left pelvic sidewall lymph node measures 1 by 1.4 cm. IMPRESSION: 1. There is no nephrolithiasis.  No hydronephrosis or hydroureter. 2. No pericecal inflammation.  Normal appendix. 3. No calcified ureteral calculi. 4. No small bowel obstruction. 5. Mild enlarged pelvic and bilateral inguinal lymph nodes. This may be reactive or due to lymphoproliferative  disease. Electronically Signed   By: Lahoma Crocker M.D.   On: 05/07/2015 20:25     Assessment and Plan  44F with h/o uterine fibroids, obesity (taking hydroxycut) who was admitted with n/v x1 month found to have atrial fib with RVR and newly recognized anemia and hyperthyroidism.  2D echo 05/08/15: mild LVH, EF 50-55%, mild MR, trivial pericardial effusion.  1. Paroxysmal atrial fib/atrial flutter - largely explained by new diagnosis of hyperthyroidism. Echo as above. CHADSVASC only 1 for female; on ASA. During admission she has gone back and forth between sinus tach, AF, and atrial flutter. Dilt added yesterday - has maintained NSR with HR 80s since last night. Continue atenolol and diltiazem. Continue.  2. Hyperthyroidism - per IM. US neck c/w goiter.  3. Anemia/thrombocytopenia/leukopenia - Hgb continues to decline. No obvous bleeding reported. Workup per internal medicine.  4. Cough/chills - felt 2/2 acute bronchitis, now on abx. 1cm nodular infiltrate noted on CTA with recommendation for f/u in 3 months. This will need to be arranged per primary team at discharge.   5. Hypomagnesemia/hypokalemia - s/p repletion. Levels improved on current regimen.   F/u scheduled @ [facility] 05/26/15 at [place] with Lyda Jester, PA-C.  Signed, Melina Copa PA-C Pager: [phone]  The patient was seen, examined and discussed with Melina Copa, PA-C and I agree with the above.   54 year old female with paroxysmal atrial fib with RVR and atrial flutter and newly recognized anemia and hyperthyroidism. In and out of a-fib with RVR and sinus tachycardia, started on atenolol 50 mg PO BID and diltiazem CD 120 mg po daily, she is now in SR with HR 80 BPM, she feels significantly better. LVEF 50-55% with normal size of the left atrium. I would expect improvement in her tachycardia with treatment of hyperthyroidism. Started on iv levofloxacin for acute bronchitis. On methimazole for hyperthyroidism.  CHA2DS2-VASc Score 1, on ASA only. We will arrange for a follow up in the clinic.  Dorothy Spark 05/11/2015

## 2015-05-19 ENCOUNTER — Ambulatory Visit: Payer: Self-pay | Attending: Internal Medicine | Admitting: Internal Medicine

## 2015-05-19 ENCOUNTER — Encounter: Payer: Self-pay | Admitting: Internal Medicine

## 2015-05-19 VITALS — BP 134/74 | HR 56 | Temp 98.0°F | Resp 16 | Wt 214.0 lb

## 2015-05-19 DIAGNOSIS — E0590 Thyrotoxicosis, unspecified without thyrotoxic crisis or storm: Secondary | ICD-10-CM

## 2015-05-19 DIAGNOSIS — K0889 Other specified disorders of teeth and supporting structures: Secondary | ICD-10-CM

## 2015-05-19 DIAGNOSIS — D649 Anemia, unspecified: Secondary | ICD-10-CM

## 2015-05-19 DIAGNOSIS — M2550 Pain in unspecified joint: Secondary | ICD-10-CM

## 2015-05-19 DIAGNOSIS — I4891 Unspecified atrial fibrillation: Secondary | ICD-10-CM

## 2015-05-19 LAB — CBC WITH DIFFERENTIAL/PLATELET
Basophils Absolute: 0 10*3/uL (ref 0.0–0.1)
Basophils Relative: 0 % (ref 0–1)
EOS ABS: 0.3 10*3/uL (ref 0.0–0.7)
EOS PCT: 4 % (ref 0–5)
HEMATOCRIT: 32.2 % — AB (ref 36.0–46.0)
Hemoglobin: 10.4 g/dL — ABNORMAL LOW (ref 12.0–15.0)
LYMPHS ABS: 1.9 10*3/uL (ref 0.7–4.0)
Lymphocytes Relative: 28 % (ref 12–46)
MCH: 25 pg — AB (ref 26.0–34.0)
MCHC: 32.3 g/dL (ref 30.0–36.0)
MCV: 77.4 fL — AB (ref 78.0–100.0)
MONO ABS: 0.4 10*3/uL (ref 0.1–1.0)
MONOS PCT: 6 % (ref 3–12)
Neutro Abs: 4.2 10*3/uL (ref 1.7–7.7)
Neutrophils Relative %: 62 % (ref 43–77)
PLATELETS: 201 10*3/uL (ref 150–400)
RBC: 4.16 MIL/uL (ref 3.87–5.11)
RDW: 15.3 % (ref 11.5–15.5)
WBC: 6.8 10*3/uL (ref 4.0–10.5)

## 2015-05-19 MED ORDER — DILTIAZEM HCL ER COATED BEADS 120 MG PO CP24: 120.0000 mg | ORAL_CAPSULE | Freq: Every day | ORAL | Status: DC

## 2015-05-19 MED ORDER — METHIMAZOLE 10 MG PO TABS: 10.0000 mg | ORAL_TABLET | Freq: Three times a day (TID) | ORAL | Status: DC

## 2015-05-19 MED ORDER — ATENOLOL 50 MG PO TABS: 50.0000 mg | ORAL_TABLET | Freq: Two times a day (BID) | ORAL | Status: DC

## 2015-05-19 MED ORDER — CYCLOBENZAPRINE HCL 5 MG PO TABS: 10.0000 mg | ORAL_TABLET | Freq: Two times a day (BID) | ORAL | Status: DC | PRN

## 2015-05-19 NOTE — Progress Notes (Signed)
Patient here for follow up from hospital Patient was admitted with atrial fib problems with her thyroid Patient states since discharge she aches all over and would like something for pain Also having some trouble with her walking takes a few steps and needs a break

## 2015-05-19 NOTE — Progress Notes (Signed)
Patient ID: Olivia Schmidt, female   DOB: 1961-06-08, 54 y.o.   MRN: [id number]  CC: HFU  HPI: Olivia Schmidt is a 54 y.o. female here today for a follow up visit.  Patient has past medical history of chronic back pain. Patient presents today for a follow up after being discharged from the hospital on 11/30 after being found to be in atrial fibrillation. Patient was evaluated during her stay and is though to have A.fib as a result of long standing untreated hyperthyroidism. Patient was discharged on Tapazole 10 mg TID, atenolol, and magnesium replacement. Patient was not started on anticoagulation at that time because her CHADVASC score was only 1 for female sex. She has since been taking all medication as prescribed but admits to not having a clear understanding what A.fib is and why she is on all the medication. Patient is concerned because she feels that the Tapazole is causing her to have arthralgias. She is unable to sleep at night due to the pain.    No Known Allergies Past Medical History  Diagnosis Date  . Fibroids    Current Outpatient Prescriptions on File Prior to Visit  Medication Sig Dispense Refill  . acetaminophen (TYLENOL) 500 MG tablet Take 1,000 mg by mouth every 6 (six) hours as needed for moderate pain or headache.     Marland Kitchen acetaminophen-codeine (TYLENOL #3) 300-30 MG per tablet Take 1-2 tablets by mouth every 8 (eight) hours as needed for moderate pain. 60 tablet 0  . atenolol (TENORMIN) 50 MG tablet Take 1 tablet (50 mg total) by mouth 2 (two) times daily. 62 tablet 0  . levofloxacin (LEVAQUIN) 500 MG tablet Take 1 tablet (500 mg total) by mouth daily. Take for 5 days then stop. 5 tablet 0  . magnesium oxide (MAG-OX) 400 (241.3 MG) MG tablet Take 1 tablet (400 mg total) by mouth 2 (two) times daily. 60 tablet 0  . methimazole (TAPAZOLE) 10 MG tablet Take 1 tablet (10 mg total) by mouth 3 (three) times daily. 120 tablet 0  . aspirin EC 81 MG EC tablet Take 1 tablet (81 mg total)  by mouth daily.    Marland Kitchen aspirin-acetaminophen-caffeine (EXCEDRIN MIGRAINE) 250-250-65 MG per tablet Take 1-2 tablets by mouth every 6 (six) hours as needed for headache.     . diltiazem (CARDIZEM CD) 120 MG 24 hr capsule Take 1 capsule (120 mg total) by mouth daily. 30 capsule 0  . guaiFENesin (MUCINEX) 600 MG 12 hr tablet Take 2 tablets (1,200 mg total) by mouth 2 (two) times daily. Take for 5 days then stop. 20 tablet 0  . hydrocortisone cream 1 % Apply topically 2 (two) times daily. 30 g 0  . Naproxen Sod-Diphenhydramine (ALEVE PM) 220-25 MG TABS Take 2 tablets by mouth daily as needed (pain).     . ondansetron (ZOFRAN ODT) 8 MG disintegrating tablet Take 1 tablet (8 mg total) by mouth every 8 (eight) hours as needed for nausea or vomiting. 30 tablet 0  . tamsulosin (FLOMAX) 0.4 MG CAPS capsule Take 1 capsule (0.4 mg total) by mouth daily. To help pass stone 14 capsule 0  . triamcinolone cream (KENALOG) 0.1 % Apply 1 application topically 2 (two) times daily. 45 g 1   No current facility-administered medications on file prior to visit.   Family History  Problem Relation Age of Onset  . Aneurysm Father     brain  . Hypertension Sister   . Hypertension Brother    Social History  Social History  . Marital Status: Married    Spouse Name: N/A  . Number of Children: N/A  . Years of Education: N/A   Occupational History  . Not on file.   Social History Main Topics  . Smoking status: Former Smoker -- 24 years    Quit date: 06/12/2003  . Smokeless tobacco: Not on file  . Alcohol Use: No  . Drug Use: No  . Sexual Activity: Yes    Birth Control/ Protection: Surgical   Other Topics Concern  . Not on file   Social History Narrative    Review of Systems  Constitutional: Positive for weight loss.  Respiratory: Negative.   Cardiovascular: Positive for palpitations. Negative for chest pain and leg swelling.  Gastrointestinal: Positive for constipation.  Musculoskeletal: Positive for  joint pain.  Neurological: Negative for dizziness.  Psychiatric/Behavioral: The patient is nervous/anxious.   All other systems reviewed and are negative.   Objective:   Filed Vitals:   05/19/15 1601  BP: 134/74  Pulse: 56  Temp: 98 F (36.7 C)  Resp: 16    Physical Exam  Constitutional: She is oriented to person, place, and time.  Neck: Thyromegaly present.  Cardiovascular:  No murmur heard. Brady cardia, A.fib  Pulmonary/Chest: Effort normal and breath sounds normal.  Neurological: She is alert and oriented to person, place, and time.  Skin: Skin is warm and dry.  Psychiatric: She has a normal mood and affect.   .  Lab Results  Component Value Date   WBC 3.5* 05/11/2015   HGB 8.3* 05/11/2015   HCT 27.5* 05/11/2015   MCV 79.7 05/11/2015   PLT 105* 05/11/2015   Lab Results  Component Value Date   CREATININE 0.53 05/11/2015   BUN 10 05/11/2015   NA 142 05/11/2015   K 3.8 05/11/2015   CL 107 05/11/2015   CO2 28 05/11/2015    Lab Results  Component Value Date   HGBA1C 6.1* 05/08/2015   Lipid Panel     Component Value Date/Time   CHOL 100 05/08/2015 0341   TRIG 46 05/08/2015 0341   HDL 20* 05/08/2015 0341   CHOLHDL 5.0 05/08/2015 0341   VLDL 9 05/08/2015 0341   LDLCALC 71 05/08/2015 0341       Assessment and plan:   Olivia Schmidt was seen today for hospitalization follow-up.  Diagnoses and all orders for this visit:  Atrial fibrillation, unspecified type ([place]) -     Ambulatory referral to Cardiology -     diltiazem (CARDIZEM CD) 120 MG 24 hr capsule; Take 1 capsule (120 mg total) by mouth daily. -     atenolol (TENORMIN) 50 MG tablet; Take 1 tablet (50 mg total) by mouth 2 (two) times daily. Continue Atenolol and cardizem. Discussed why medication is important and how it helps with rate control.  Hyperthyroidism -     Ambulatory referral to Endocrinology -     methimazole (TAPAZOLE) 10 MG tablet; Take 1 tablet (10 mg total) by mouth 3 (three) times  daily. Continue Tapazole, will refer to Endo  Anemia, unspecified anemia type -     CBC with Differential I will send iron supplement if her hemoglobin has not improved.   Polyarthralgia -     cyclobenzaprine (FLEXERIL) 5 MG tablet; Take 2 tablets (10 mg total) by mouth 2 (two) times daily as needed for muscle spasms. Related to Tapazole use  Pain, dental -     Ambulatory referral to Dentistry  Return in about 2 months (  around 07/20/2015) for follow up.        Lance Bosch, [facility] [phone] 05/19/2015, 4:56 PM

## 2015-05-20 ENCOUNTER — Telehealth: Payer: Self-pay

## 2015-05-20 MED ORDER — FERROUS SULFATE 325 (65 FE) MG PO TABS: 325.0000 mg | ORAL_TABLET | Freq: Every day | ORAL | Status: DC

## 2015-05-20 NOTE — Telephone Encounter (Signed)
Spoke with patient and she is aware of her lab results and to pick up her RX For iron supplements

## 2015-05-20 NOTE — Telephone Encounter (Signed)
-----   Message from Lance Bosch, NP sent at 05/20/2015  2:11 PM EST ----- Hemoglobin (anemia) is improving but I still want her to take a daily iron tablet. Please send ferrous sulfate 325 mg to take daily. May make her constipated so she can take a daily stool softener as well

## 2015-05-26 ENCOUNTER — Encounter: Payer: Self-pay | Admitting: Cardiology

## 2015-05-26 ENCOUNTER — Ambulatory Visit (INDEPENDENT_AMBULATORY_CARE_PROVIDER_SITE_OTHER): Payer: Self-pay | Admitting: Cardiology

## 2015-05-26 VITALS — BP 122/84 | HR 64 | Ht 64.0 in | Wt 210.4 lb

## 2015-05-26 DIAGNOSIS — R0989 Other specified symptoms and signs involving the circulatory and respiratory systems: Secondary | ICD-10-CM

## 2015-05-26 DIAGNOSIS — I4891 Unspecified atrial fibrillation: Secondary | ICD-10-CM

## 2015-05-26 NOTE — Progress Notes (Signed)
05/26/2015 Olivia Schmidt Minor   1961-02-28  [id number]  Primary Physician Lance Bosch, NP Primary Cardiologist: Dr. Meda Coffee   Reason for Visit/CC: [facility] F/u for Atrial Fibrillation  HPI:  54 y/o AAF who presents to clinic for post hospital f/u where she was diagnosed with new onset atrial fibrillation, in the setting of newly diagnosed hyperthyroidism with goiter as well as acute bronchitis. TSH was 0.013. T3 was 451. Free T4 was 5.17. Internal medicine placed her on methimazole. She converted to SR with rate control therapy with Cardizem and atenolol. 2D echo showed normal LVEF of 50-55% + mild MR. Her CHA2DS2 VASc score is only 1 for female sex. ASA was advised. Outpatient endocrine consultation arranged for 06/01/15.  She presents to clinic today for post hospital f/u. She reports that she has done well. She denies any recurrent palpiations. No chest pain or dyspnea. Her cough secondary to bronchitis has resolved. She reports full medication compliance.   EKG today shows NSR. HR is well controlled at 64 bpm. BP is stable at 122/84. Bilateral carotid bruits detected on physical exam. She notes prior history of tobacco abuse of almost 25 yrs. She notes frequent highhandedness. No syncope.  She plans to keep her consultation with the endocrinologist next week.     Current Outpatient Prescriptions  Medication Sig Dispense Refill  . aspirin EC 81 MG EC tablet Take 1 tablet (81 mg total) by mouth daily.    Marland Kitchen aspirin-acetaminophen-caffeine (EXCEDRIN MIGRAINE) 250-250-65 MG per tablet Take 1-2 tablets by mouth every 6 (six) hours as needed for headache.     Marland Kitchen atenolol (TENORMIN) 50 MG tablet Take 1 tablet (50 mg total) by mouth 2 (two) times daily. 62 tablet 0  . cyclobenzaprine (FLEXERIL) 5 MG tablet Take 2 tablets (10 mg total) by mouth 2 (two) times daily as needed for muscle spasms. 30 tablet 1  . diltiazem (CARDIZEM CD) 120 MG 24 hr capsule Take 1 capsule (120 mg total) by mouth  daily. 30 capsule 0  . ferrous sulfate 325 (65 FE) MG tablet Take 1 tablet (325 mg total) by mouth daily with breakfast. 30 tablet 3  . hydrocortisone cream 1 % Apply topically 2 (two) times daily. 30 g 0  . ibuprofen (ADVIL,MOTRIN) 200 MG tablet Take 200 mg by mouth every 6 (six) hours as needed.    . magnesium oxide (MAG-OX) 400 (241.3 MG) MG tablet Take 1 tablet (400 mg total) by mouth 2 (two) times daily. 60 tablet 0  . methimazole (TAPAZOLE) 10 MG tablet Take 1 tablet (10 mg total) by mouth 3 (three) times daily. 120 tablet 0  . Naproxen Sod-Diphenhydramine (ALEVE PM) 220-25 MG TABS Take 2 tablets by mouth daily as needed (pain).     . ondansetron (ZOFRAN ODT) 8 MG disintegrating tablet Take 1 tablet (8 mg total) by mouth every 8 (eight) hours as needed for nausea or vomiting. 30 tablet 0  . tamsulosin (FLOMAX) 0.4 MG CAPS capsule Take 1 capsule (0.4 mg total) by mouth daily. To help pass stone 14 capsule 0  . triamcinolone cream (KENALOG) 0.1 % Apply 1 application topically 2 (two) times daily. 45 g 1   No current facility-administered medications for this visit.    No Known Allergies  Social History   Social History  . Marital Status: Married    Spouse Name: N/A  . Number of Children: N/A  . Years of Education: N/A   Occupational History  . Not on file.  Social History Main Topics  . Smoking status: Former Smoker -- 24 years    Quit date: 06/12/2003  . Smokeless tobacco: Never Used  . Alcohol Use: No  . Drug Use: No  . Sexual Activity: Yes    Birth Control/ Protection: Surgical   Other Topics Concern  . Not on file   Social History Narrative     Review of Systems: General: negative for chills, fever, night sweats or weight changes.  Cardiovascular: negative for chest pain, dyspnea on exertion, edema, orthopnea, palpitations, paroxysmal nocturnal dyspnea or shortness of breath Dermatological: negative for rash Respiratory: negative for cough or wheezing Urologic:  negative for hematuria Abdominal: negative for nausea, vomiting, diarrhea, bright red blood per rectum, melena, or hematemesis Neurologic: negative for visual changes, syncope, or dizziness All other systems reviewed and are otherwise negative except as noted above.    Blood pressure 122/84, pulse 64, height 5\' 4"  (1.626 m), weight 210 lb 6.4 oz (95.437 kg), last menstrual period 09/14/2010.  General appearance: alert, cooperative and no distress Neck: no JVD and bilateral carotid bruits, + goiter noted Lungs: clear to auscultation bilaterally Heart: regular rate and rhythm and 2/6 SM along LSB and apex Extremities: no LEE Pulses: 2+ and symmetric Skin: warm and dry Neurologic: Grossly normal  EKG NST 64 bpm.   ASSESSMENT AND PLAN:   1. Atrial Fibrillation: occurred in the setting of hyperthyroidism and bronchitis, both of which have been/ are being treated. EKG today shows NSR. HR is well controlled. She denies any symptoms of breakthrough atrial fibrillation. Continue Cardizem and atenolol for rate control. Her CHA2DS2 VASc score is only 1 for female sex. Thus no indication for anticoagulation. She can continue daily ASA. May be able to eventually discontinue rate control meds if no further recurrence after stabilization of thyroid function.   2. Hyperthyroidism: on methimazole. OP endocrinology consultation scheduled for 06/01/15.  3. Acute Bronchitis: completed course of antibiotics. Symptoms resolved.   4. Cardiac murmur: recent echo showed mild MR. No AS noted.   5. Bilateral Carotid Bruits: recent echo reviewed. She has no AS. Only mild MR, which should not radiate to the carotids. Will need to r/o carotid artery stenosis. She is a former smoker. She smoked for nearly 25 yrs before quitting.  PLAN  Will obtain carotid dopplers as outlined above. Patient stable from a cardiac standpoint. F/u with Dr. Meda Coffee in 8 weeks.   Lyda Jester PA-C 05/26/2015 9:32 AM

## 2015-05-26 NOTE — Patient Instructions (Addendum)
Medication Instructions:  Your physician recommends that you continue on your current medications as directed. Please refer to the Current Medication list given to you today.   Labwork: None ordered  Testing/Procedures: Your physician has requested that you have a carotid duplex. This test is an ultrasound of the carotid arteries in your neck. It looks at blood flow through these arteries that supply the brain with blood. Allow one hour for this exam. There are no restrictions or special instructions.    Follow-Up: Your physician recommends that you schedule a follow-up appointment in:  [place]   Any Other Special Instructions Will Be Listed Below (If Applicable).     If you need a refill on your cardiac medications before your next appointment, please call your pharmacy.

## 2015-06-01 ENCOUNTER — Encounter: Payer: Self-pay | Admitting: Endocrinology

## 2015-06-01 ENCOUNTER — Ambulatory Visit (INDEPENDENT_AMBULATORY_CARE_PROVIDER_SITE_OTHER): Payer: Self-pay | Admitting: Endocrinology

## 2015-06-01 ENCOUNTER — Other Ambulatory Visit: Payer: Self-pay

## 2015-06-01 VITALS — BP 116/64 | HR 64 | Temp 97.7°F | Ht 64.0 in | Wt 212.0 lb

## 2015-06-01 DIAGNOSIS — E0590 Thyrotoxicosis, unspecified without thyrotoxic crisis or storm: Secondary | ICD-10-CM

## 2015-06-01 LAB — T4, FREE: Free T4: 1.05 ng/dL (ref 0.60–1.60)

## 2015-06-01 LAB — TSH: TSH: 0.07 u[IU]/mL — ABNORMAL LOW (ref 0.35–4.50)

## 2015-06-01 MED ORDER — METHIMAZOLE 10 MG PO TABS: 10.0000 mg | ORAL_TABLET | Freq: Two times a day (BID) | ORAL | Status: DC

## 2015-06-01 MED ORDER — ATENOLOL 50 MG PO TABS: 25.0000 mg | ORAL_TABLET | Freq: Two times a day (BID) | ORAL | Status: DC

## 2015-06-01 NOTE — Patient Instructions (Addendum)
blood tests are requested for you today.  We'll let you know about the results. if ever you have fever while taking methimazole, stop it and call us, even if the reason is obvious, because of the risk of a rare side-effect. Please come back for a follow-up appointment in 1 month.  Please reduce the atenolol to 25 mg twice a day.  When the thyroid is improved, we can do the radioactive iodine.  It works like this: You would stop taking the methimazole.  Then we would check a thyroid "scan" (a special, but easy and painless type of thyroid x ray).  It works like this: you go to the x-ray department of the hospital to swallow a pill, which contains a miniscule amount of radiation.  You will not notice any symptoms from this.  You will go back to the x-ray department the next day, to lie down in front of a camera.  The results of this will be sent to me.   Based on the results, i hope to order for you a treatment pill of radioactive iodine.  Although it is a larger amount of radiation, you will again notice no symptoms from this.  The pill is gone from your body in a few days (during which you should stay away from other people), but takes several months to work.  Therefore, please resume the methimazole 3 days after you take the treatment pill.  please return here approximately 6-8 weeks after the treatment.  This treatment has been available for many years, and the only known side-effect is an underactive thyroid.  It is possible that i would eventually prescribe for you a thyroid hormone pill, which is very inexpensive.  You don't have to worry about side-effects of this thyroid hormone pill, because it is the same molecule your thyroid makes.       Hyperthyroidism Hyperthyroidism is when the thyroid is too active (overactive). Your thyroid is a large gland that is located in your neck. The thyroid helps to control how your body uses food (metabolism). When your thyroid is overactive, it produces too  much of a hormone called thyroxine.  CAUSES Causes of hyperthyroidism may include:  Graves disease. This is when your immune system attacks the thyroid gland. This is the most common cause.  Inflammation of the thyroid gland.  Tumor in the thyroid gland or somewhere else.  Excessive use of thyroid medicines, including:  Prescription thyroid supplement.  Herbal supplements that mimic thyroid hormones.  Solid or fluid-filled lumps within your thyroid gland (thyroid nodules).  Excessive ingestion of iodine. RISK FACTORS  Being female.  Having a family history of thyroid conditions. SIGNS AND SYMPTOMS Signs and symptoms of hyperthyroidism may include:  Nervousness.  Inability to tolerate heat.  Unexplained weight loss.  Diarrhea.  Change in the texture of hair or skin.  Heart skipping beats or making extra beats.  Rapid heart rate.  Loss of menstruation.  Shaky hands.  Fatigue.  Restlessness.  Increased appetite.  Sleep problems.  Enlarged thyroid gland or nodules. DIAGNOSIS  Diagnosis of hyperthyroidism may include:  Medical history and physical exam.  Blood tests.  Ultrasound tests. TREATMENT Treatment may include:  Medicines to control your thyroid.  Surgery to remove your thyroid.  Radiation therapy. HOME CARE INSTRUCTIONS   Take medicines only as directed by your health care provider.  Do not use any tobacco products, including cigarettes, chewing tobacco, or electronic cigarettes. If you need help quitting, ask your health care provider.  Do not exercise or do physical activity until your health care provider approves.  Keep all follow-up appointments as directed by your health care provider. This is important. SEEK MEDICAL CARE IF:  Your symptoms do not get better with treatment.  You have fever.  You are taking thyroid replacement medicine and you:  Have depression.  Feel mentally and physically slow.  Have weight  gain. SEEK IMMEDIATE MEDICAL CARE IF:   You have decreased alertness or a change in your awareness.  You have abdominal pain.  You feel dizzy.  You have a rapid heartbeat.  You have an irregular heartbeat.   This information is not intended to replace advice given to you by your health care provider. Make sure you discuss any questions you have with your health care provider.   Document Released: 05/28/2005 Document Revised: 06/18/2014 Document Reviewed: 10/13/2013 Elsevier Interactive Patient Education [employer].

## 2015-06-01 NOTE — Progress Notes (Signed)
Subjective:    Patient ID: Olivia Schmidt, female    DOB: 01-31-1961, 54 y.o.   MRN: [id number]  HPI Pt reports 3 mos of slight sensation of fullness in the neck, but no assoc pain.  She was in the hospital with AF, and was rx'ed tapazole.  She has no prior h/o any thyroid problem.  she has never had XRT to the anterior neck, or thyroid surgery.  she has never had thyroid imaging.  she does not consume kelp or any non-prescribed thyroid medication.  she has never been on amiodarone.   Past Medical History  Diagnosis Date  . Fibroids     Past Surgical History  Procedure Laterality Date  . Abdominal hysterectomy      Social History   Social History  . Marital Status: Married    Spouse Name: N/A  . Number of Children: N/A  . Years of Education: N/A   Occupational History  . Not on file.   Social History Main Topics  . Smoking status: Former Smoker -- 24 years    Quit date: 06/12/2003  . Smokeless tobacco: Never Used  . Alcohol Use: No  . Drug Use: No  . Sexual Activity: Yes    Birth Control/ Protection: Surgical   Other Topics Concern  . Not on file   Social History Narrative    Current Outpatient Prescriptions on File Prior to Visit  Medication Sig Dispense Refill  . aspirin EC 81 MG EC tablet Take 1 tablet (81 mg total) by mouth daily.    Marland Kitchen aspirin-acetaminophen-caffeine (EXCEDRIN MIGRAINE) 250-250-65 MG per tablet Take 1-2 tablets by mouth every 6 (six) hours as needed for headache.     . cyclobenzaprine (FLEXERIL) 5 MG tablet Take 2 tablets (10 mg total) by mouth 2 (two) times daily as needed for muscle spasms. 30 tablet 1  . diltiazem (CARDIZEM CD) 120 MG 24 hr capsule Take 1 capsule (120 mg total) by mouth daily. 30 capsule 0  . ferrous sulfate 325 (65 FE) MG tablet Take 1 tablet (325 mg total) by mouth daily with breakfast. 30 tablet 3  . hydrocortisone cream 1 % Apply topically 2 (two) times daily. 30 g 0  . ibuprofen (ADVIL,MOTRIN) 200 MG tablet Take 200 mg  by mouth every 6 (six) hours as needed.    . magnesium oxide (MAG-OX) 400 (241.3 MG) MG tablet Take 1 tablet (400 mg total) by mouth 2 (two) times daily. 60 tablet 0  . Naproxen Sod-Diphenhydramine (ALEVE PM) 220-25 MG TABS Take 2 tablets by mouth daily as needed (pain).     . ondansetron (ZOFRAN ODT) 8 MG disintegrating tablet Take 1 tablet (8 mg total) by mouth every 8 (eight) hours as needed for nausea or vomiting. 30 tablet 0  . tamsulosin (FLOMAX) 0.4 MG CAPS capsule Take 1 capsule (0.4 mg total) by mouth daily. To help pass stone 14 capsule 0  . triamcinolone cream (KENALOG) 0.1 % Apply 1 application topically 2 (two) times daily. 45 g 1   No current facility-administered medications on file prior to visit.    No Known Allergies  Family History  Problem Relation Age of Onset  . Aneurysm Father     brain  . Hypertension Sister   . Hypertension Brother   . Thyroid disease Neg Hx     BP 116/64 mmHg  Pulse 64  Temp(Src) 97.7 F (36.5 C) (Oral)  Ht 5\' 4"  (1.626 m)  Wt 212 lb (96.163 kg)  BMI  36.37 kg/m2  SpO2 99%  LMP 09/14/2010   Review of Systems denies headache, hoarseness, visual loss, palpitations, sob, diarrhea, polyuria, excessive diaphoresis, heat intolerance, easy bruising, and rhinorrhea. She reports slight darkening of the skin, tremor, and muscle weakness. She has lost 100 lbs x a few months.  Anxiety is improved sine hosp d/c.     Objective:   Physical Exam VS: see vs page GEN: no distress HEAD: head: no deformity eyes: no periorbital swelling, but there is moderate bilateral proptosis external nose and ears are normal mouth: no lesion seen NECK: supple, thyroid is approx 5 times normal size, R>L.  CHEST WALL: no deformity LUNGS:  Clear to auscultation CV: reg rate and rhythm, no murmur ABD: abdomen is soft, nontender.  no hepatosplenomegaly.  not distended.  no hernia.  MUSCULOSKELETAL: muscle bulk and strength are grossly normal.  no obvious joint  swelling.  gait is normal and steady.  EXTEMITIES: no deformity.  no edema NEURO:  cn 2-12 grossly intact.   readily moves all 4's.  sensation is intact to touch on all 4's. No tremor. SKIN:  Normal texture and temperature.  No rash or suspicious lesion is visible.   NODES:  None palpable at the neck.   PSYCH: alert, well-oriented.  Does not appear anxious nor depressed.     Lab Results  Component Value Date   TSH 0.07* 06/01/2015   T3TOTAL 451* 05/07/2015   I have reviewed d/c summary from recent hosp visit, and summarized: Pt was rx'ed for AF and hyperthyroidism, then referred here.     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   AF: in this context, she needs prompt improvement in her TFT.  She can also reduce her b-blocker now  Patient is advised the following: Patient Instructions  blood tests are requested for you today.  We'll let you know about the results. if ever you have fever while taking methimazole, stop it and call us, even if the reason is obvious, because of the risk of a rare side-effect. Please come back for a follow-up appointment in 1 month.  Please reduce the atenolol to 25 mg twice a day.  When the thyroid is improved, we can do the radioactive iodine.  It works like this: You would stop taking the methimazole.  Then we would check a thyroid "scan" (a special, but easy and painless type of thyroid x ray).  It works like this: you go to the x-ray department of the hospital to swallow a pill, which contains a miniscule amount of radiation.  You will not notice any symptoms from this.  You will go back to the x-ray department the next day, to lie down in front of a camera.  The results of this will be sent to me.   Based on the results, i hope to order for you a treatment pill of radioactive iodine.  Although it is a larger amount of radiation, you will again notice no symptoms from this.  The pill is gone from your body  in a few days (during which you should stay away from other people), but takes several months to work.  Therefore, please resume the methimazole 3 days after you take the treatment pill.  please return here approximately 6-8 weeks after the treatment.  This treatment has been available for many years, and the only known side-effect is an underactive thyroid.  It is possible that i would eventually prescribe for you a  thyroid hormone pill, which is very inexpensive.  You don't have to worry about side-effects of this thyroid hormone pill, because it is the same molecule your thyroid makes.       Hyperthyroidism Hyperthyroidism is when the thyroid is too active (overactive). Your thyroid is a large gland that is located in your neck. The thyroid helps to control how your body uses food (metabolism). When your thyroid is overactive, it produces too much of a hormone called thyroxine.  CAUSES Causes of hyperthyroidism may include:  Graves disease. This is when your immune system attacks the thyroid gland. This is the most common cause.  Inflammation of the thyroid gland.  Tumor in the thyroid gland or somewhere else.  Excessive use of thyroid medicines, including:  Prescription thyroid supplement.  Herbal supplements that mimic thyroid hormones.  Solid or fluid-filled lumps within your thyroid gland (thyroid nodules).  Excessive ingestion of iodine. RISK FACTORS  Being female.  Having a family history of thyroid conditions. SIGNS AND SYMPTOMS Signs and symptoms of hyperthyroidism may include:  Nervousness.  Inability to tolerate heat.  Unexplained weight loss.  Diarrhea.  Change in the texture of hair or skin.  Heart skipping beats or making extra beats.  Rapid heart rate.  Loss of menstruation.  Shaky hands.  Fatigue.  Restlessness.  Increased appetite.  Sleep problems.  Enlarged thyroid gland or nodules. DIAGNOSIS  Diagnosis of hyperthyroidism may  include:  Medical history and physical exam.  Blood tests.  Ultrasound tests. TREATMENT Treatment may include:  Medicines to control your thyroid.  Surgery to remove your thyroid.  Radiation therapy. HOME CARE INSTRUCTIONS   Take medicines only as directed by your health care provider.  Do not use any tobacco products, including cigarettes, chewing tobacco, or electronic cigarettes. If you need help quitting, ask your health care provider.  Do not exercise or do physical activity until your health care provider approves.  Keep all follow-up appointments as directed by your health care provider. This is important. SEEK MEDICAL CARE IF:  Your symptoms do not get better with treatment.  You have fever.  You are taking thyroid replacement medicine and you:  Have depression.  Feel mentally and physically slow.  Have weight gain. SEEK IMMEDIATE MEDICAL CARE IF:   You have decreased alertness or a change in your awareness.  You have abdominal pain.  You feel dizzy.  You have a rapid heartbeat.  You have an irregular heartbeat.   This information is not intended to replace advice given to you by your health care provider. Make sure you discuss any questions you have with your health care provider.   Document Released: 05/28/2005 Document Revised: 06/18/2014 Document Reviewed: 10/13/2013 Elsevier Interactive Patient Education [employer].

## 2015-06-08 ENCOUNTER — Ambulatory Visit (HOSPITAL_COMMUNITY)
Admission: RE | Admit: 2015-06-08 | Discharge: 2015-06-08 | Disposition: A | Discharge status: Home / Self Care | Payer: Self-pay | Source: Ambulatory Visit | Attending: Cardiology | Admitting: Cardiology

## 2015-06-08 DIAGNOSIS — I6523 Occlusion and stenosis of bilateral carotid arteries: Secondary | ICD-10-CM | POA: Insufficient documentation

## 2015-06-08 DIAGNOSIS — R0989 Other specified symptoms and signs involving the circulatory and respiratory systems: Secondary | ICD-10-CM

## 2015-06-21 ENCOUNTER — Ambulatory Visit: Payer: Self-pay

## 2015-06-27 ENCOUNTER — Ambulatory Visit: Payer: Self-pay | Attending: Internal Medicine

## 2015-07-04 ENCOUNTER — Encounter: Payer: Self-pay | Admitting: Endocrinology

## 2015-07-04 ENCOUNTER — Ambulatory Visit (INDEPENDENT_AMBULATORY_CARE_PROVIDER_SITE_OTHER): Payer: Self-pay | Admitting: Endocrinology

## 2015-07-04 ENCOUNTER — Other Ambulatory Visit: Payer: Self-pay | Admitting: Endocrinology

## 2015-07-04 VITALS — BP 138/87 | HR 80 | Temp 97.5°F | Ht 64.0 in | Wt 215.0 lb

## 2015-07-04 DIAGNOSIS — E0590 Thyrotoxicosis, unspecified without thyrotoxic crisis or storm: Secondary | ICD-10-CM

## 2015-07-04 LAB — T4, FREE: Free T4: 0.85 ng/dL (ref 0.60–1.60)

## 2015-07-04 LAB — TSH: TSH: 0.21 u[IU]/mL — AB (ref 0.35–4.50)

## 2015-07-04 MED ORDER — METHIMAZOLE 10 MG PO TABS: 10.0000 mg | ORAL_TABLET | Freq: Every day | ORAL | Status: DC

## 2015-07-04 NOTE — Patient Instructions (Signed)
blood tests are requested for you today.  We'll let you know about the results. if ever you have fever while taking methimazole, stop it and call us, even if the reason is obvious, because of the risk of a rare side-effect. Please come back for a follow-up appointment in 2 months.  We can reconsider the radioactive iodine in the future if you wish.

## 2015-07-04 NOTE — Progress Notes (Signed)
Subjective:    Patient ID: Olivia Schmidt, female    DOB: 1960/09/01, 55 y.o.   MRN: [id number]  HPI Pt returns for f/u of hyperthyroidism (dx'ed late 2016, when she presented with AF, and was rx'ed tapazole; she has never had thyroid imaging). She has stopped taking the tenormin.  pt states she feels well in general.  She says she never misses the tapazole.   Past Medical History  Diagnosis Date  . Fibroids     Past Surgical History  Procedure Laterality Date  . Abdominal hysterectomy      Social History   Social History  . Marital Status: Married    Spouse Name: N/A  . Number of Children: N/A  . Years of Education: N/A   Occupational History  . Not on file.   Social History Main Topics  . Smoking status: Former Smoker -- 24 years    Quit date: 06/12/2003  . Smokeless tobacco: Never Used  . Alcohol Use: No  . Drug Use: No  . Sexual Activity: Yes    Birth Control/ Protection: Surgical   Other Topics Concern  . Not on file   Social History Narrative    Current Outpatient Prescriptions on File Prior to Visit  Medication Sig Dispense Refill  . aspirin EC 81 MG EC tablet Take 1 tablet (81 mg total) by mouth daily.    Marland Kitchen aspirin-acetaminophen-caffeine (EXCEDRIN MIGRAINE) 250-250-65 MG per tablet Take 1-2 tablets by mouth every 6 (six) hours as needed for headache.     . cyclobenzaprine (FLEXERIL) 5 MG tablet Take 2 tablets (10 mg total) by mouth 2 (two) times daily as needed for muscle spasms. 30 tablet 1  . diltiazem (CARDIZEM CD) 120 MG 24 hr capsule Take 1 capsule (120 mg total) by mouth daily. 30 capsule 0  . ferrous sulfate 325 (65 FE) MG tablet Take 1 tablet (325 mg total) by mouth daily with breakfast. 30 tablet 3  . hydrocortisone cream 1 % Apply topically 2 (two) times daily. 30 g 0  . ibuprofen (ADVIL,MOTRIN) 200 MG tablet Take 200 mg by mouth every 6 (six) hours as needed.    . magnesium oxide (MAG-OX) 400 (241.3 MG) MG tablet Take 1 tablet (400 mg total)  by mouth 2 (two) times daily. 60 tablet 0  . methimazole (TAPAZOLE) 10 MG tablet Take 1 tablet (10 mg total) by mouth 2 (two) times daily. 60 tablet 2  . Naproxen Sod-Diphenhydramine (ALEVE PM) 220-25 MG TABS Take 2 tablets by mouth daily as needed (pain).     . ondansetron (ZOFRAN ODT) 8 MG disintegrating tablet Take 1 tablet (8 mg total) by mouth every 8 (eight) hours as needed for nausea or vomiting. 30 tablet 0  . tamsulosin (FLOMAX) 0.4 MG CAPS capsule Take 1 capsule (0.4 mg total) by mouth daily. To help pass stone 14 capsule 0  . triamcinolone cream (KENALOG) 0.1 % Apply 1 application topically 2 (two) times daily. 45 g 1   No current facility-administered medications on file prior to visit.    No Known Allergies  Family History  Problem Relation Age of Onset  . Aneurysm Father     brain  . Hypertension Sister   . Hypertension Brother   . Thyroid disease Neg Hx     BP 138/87 mmHg  Pulse 80  Temp(Src) 97.5 F (36.4 C) (Oral)  Ht 5\' 4"  (1.626 m)  Wt 215 lb (97.523 kg)  BMI 36.89 kg/m2  SpO2 96%  LMP  09/14/2010   Review of Systems Denies fever    Objective:   Physical Exam VS: see vs page GEN: no distress NECK: supple, thyroid is approx 3 times normal size, R>L.        Assessment & Plan:  Hyperthyroidism, clinically better.  We discussed rx options.  She chooses to continue tapazole for now  Patient is advised the following: Patient Instructions  blood tests are requested for you today.  We'll let you know about the results. if ever you have fever while taking methimazole, stop it and call us, even if the reason is obvious, because of the risk of a rare side-effect. Please come back for a follow-up appointment in 2 months.  We can reconsider the radioactive iodine in the future if you wish.

## 2015-07-12 ENCOUNTER — Encounter: Payer: Self-pay | Admitting: *Deleted

## 2015-07-18 ENCOUNTER — Ambulatory Visit: Payer: Self-pay

## 2015-07-19 ENCOUNTER — Other Ambulatory Visit: Payer: Self-pay | Admitting: Internal Medicine

## 2015-07-19 ENCOUNTER — Ambulatory Visit: Payer: Self-pay | Attending: Internal Medicine

## 2015-07-19 VITALS — BP 126/86 | HR 72 | Temp 98.0°F | Resp 16 | Ht 64.0 in | Wt 215.0 lb

## 2015-07-19 DIAGNOSIS — Z0000 Encounter for general adult medical examination without abnormal findings: Secondary | ICD-10-CM

## 2015-07-19 NOTE — Progress Notes (Signed)
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

## 2015-07-21 ENCOUNTER — Ambulatory Visit: Payer: Self-pay | Attending: Internal Medicine

## 2015-07-21 LAB — TB SKIN TEST
INDURATION: 0 mm
TB SKIN TEST: NEGATIVE

## 2015-07-21 NOTE — Progress Notes (Unsigned)
Patient here to have her ppd test read Patient is negative  Documentation given to patient

## 2015-07-22 ENCOUNTER — Ambulatory Visit: Payer: Self-pay | Admitting: Cardiology

## 2015-09-01 ENCOUNTER — Telehealth: Payer: Self-pay

## 2015-09-01 ENCOUNTER — Ambulatory Visit (INDEPENDENT_AMBULATORY_CARE_PROVIDER_SITE_OTHER): Payer: Self-pay | Admitting: Endocrinology

## 2015-09-01 ENCOUNTER — Telehealth: Payer: Self-pay | Admitting: Internal Medicine

## 2015-09-01 ENCOUNTER — Encounter: Payer: Self-pay | Admitting: Endocrinology

## 2015-09-01 VITALS — BP 134/78 | HR 71 | Temp 97.9°F | Ht 64.0 in | Wt 226.0 lb

## 2015-09-01 DIAGNOSIS — H53149 Visual discomfort, unspecified: Secondary | ICD-10-CM

## 2015-09-01 DIAGNOSIS — E0590 Thyrotoxicosis, unspecified without thyrotoxic crisis or storm: Secondary | ICD-10-CM

## 2015-09-01 LAB — TSH: TSH: 0.02 u[IU]/mL — ABNORMAL LOW (ref 0.35–4.50)

## 2015-09-01 LAB — T4, FREE: FREE T4: 1.93 ng/dL — AB (ref 0.60–1.60)

## 2015-09-01 MED ORDER — METHIMAZOLE 10 MG PO TABS: 10.0000 mg | ORAL_TABLET | Freq: Two times a day (BID) | ORAL | Status: DC

## 2015-09-01 NOTE — Progress Notes (Signed)
Subjective:    Patient ID: Olivia Schmidt Minor, female    DOB: 03/15/1961, 55 y.o.   MRN: [id number]  HPI Pt returns for f/u of hyperthyroidism (dx'ed late 2016, when she presented with AF, and was rx'ed tapazole; she has never had thyroid imaging).  pt states she continues to feel better in general.  She says she never misses the tapazole.   Past Medical History  Diagnosis Date  . Fibroids   . PAF (paroxysmal atrial fibrillation) ([place]) 05/09/2015  . Atrial fibrillation with rapid ventricular response ([place])   . Thrombocytopenia ([place]) 05/09/2015  . Hypomagnesemia 05/08/2015  . Hypokalemia 05/08/2015  . Hyperthyroidism 05/07/2015  . Goiter   . FIBROIDS, UTERUS 07/11/2009    Qualifier: Diagnosis of  By: Amil Amen MD, Benjamine Mola    . FEMALE INFERTILITY 10/21/2008    Qualifier: Diagnosis of  By: Amil Amen MD, Benjamine Mola    . Chest pain 05/07/2015  . Anemia 05/07/2015  . Acute bronchitis 05/08/2015    Past Surgical History  Procedure Laterality Date  . Abdominal hysterectomy      Social History   Social History  . Marital Status: Married    Spouse Name: N/A  . Number of Children: N/A  . Years of Education: N/A   Occupational History  . Not on file.   Social History Main Topics  . Smoking status: Former Smoker -- 24 years    Quit date: 06/12/2003  . Smokeless tobacco: Never Used  . Alcohol Use: No  . Drug Use: No  . Sexual Activity: Yes    Birth Control/ Protection: Surgical   Other Topics Concern  . Not on file   Social History Narrative    Current Outpatient Prescriptions on File Prior to Visit  Medication Sig Dispense Refill  . aspirin EC 81 MG EC tablet Take 1 tablet (81 mg total) by mouth daily.    Marland Kitchen aspirin-acetaminophen-caffeine (EXCEDRIN MIGRAINE) 250-250-65 MG per tablet Take 1-2 tablets by mouth every 6 (six) hours as needed for headache. Reported on 09/01/2015    . ibuprofen (ADVIL,MOTRIN) 200 MG tablet Take 200 mg by mouth every 6 (six) hours as needed.  Reported on 09/01/2015     No current facility-administered medications on file prior to visit.    No Known Allergies  Family History  Problem Relation Age of Onset  . Aneurysm Father     brain  . Hypertension Sister   . Hypertension Brother   . Thyroid disease Neg Hx     BP 134/78 mmHg  Pulse 71  Temp(Src) 97.9 F (36.6 C) (Oral)  Ht 5\' 4"  (1.626 m)  Wt 226 lb (102.513 kg)  BMI 38.77 kg/m2  SpO2 98%  LMP 09/14/2010  Review of Systems Denies fever    Objective:   Physical Exam VITAL SIGNS:  See vs page GENERAL: no distress Neck: thyroid is 5 times normal size, (R>>L)   Lab Results  Component Value Date   TSH 0.02* 09/01/2015   T3TOTAL 451* 05/07/2015      Assessment & Plan:  Hyperthyroidism: worse.  Patient is advised the following: Patient Instructions  blood tests are requested for you today.  We'll let you know about the results. if ever you have fever while taking methimazole, stop it and call us, even if the reason is obvious, because of the risk of a rare side-effect. Please come back for a follow-up appointment in 3 months.  We can reconsider the radioactive iodine in the future if you wish. It works  like this: We would first check a thyroid "scan" (a special, but easy and painless type of thyroid x ray).  you go to the x-ray department of the hospital to swallow a pill, which contains a miniscule amount of radiation.  You will not notice any symptoms from this.  You will go back to the x-ray department the next day, to lie down in front of a camera.  The results of this will be sent to me.   Based on the results, i hope to order for you a treatment pill of radioactive iodine.  Although it is a larger amount of radiation, you will again notice no symptoms from this.  The pill is gone from your body in a few days (during which you should stay away from other people), but takes several months to work.  Therefore, please return here approximately 6-8 weeks after  the treatment.  This treatment has been available for many years, and the only known side-effect is an underactive thyroid.  It is possible that i would eventually prescribe for you a thyroid hormone pill, which is very inexpensive.  You don't have to worry about side-effects of this thyroid hormone pill, because it is the same molecule your thyroid makes.  addendum: increase back to 1 pill, twice a day

## 2015-09-01 NOTE — Patient Instructions (Addendum)
blood tests are requested for you today.  We'll let you know about the results. if ever you have fever while taking methimazole, stop it and call us, even if the reason is obvious, because of the risk of a rare side-effect. Please come back for a follow-up appointment in 3 months.  We can reconsider the radioactive iodine in the future if you wish. It works like this: We would first check a thyroid "scan" (a special, but easy and painless type of thyroid x ray).  you go to the x-ray department of the hospital to swallow a pill, which contains a miniscule amount of radiation.  You will not notice any symptoms from this.  You will go back to the x-ray department the next day, to lie down in front of a camera.  The results of this will be sent to me.   Based on the results, i hope to order for you a treatment pill of radioactive iodine.  Although it is a larger amount of radiation, you will again notice no symptoms from this.  The pill is gone from your body in a few days (during which you should stay away from other people), but takes several months to work.  Therefore, please return here approximately 6-8 weeks after the treatment.  This treatment has been available for many years, and the only known side-effect is an underactive thyroid.  It is possible that i would eventually prescribe for you a thyroid hormone pill, which is very inexpensive.  You don't have to worry about side-effects of this thyroid hormone pill, because it is the same molecule your thyroid makes.

## 2015-09-01 NOTE — Telephone Encounter (Signed)
Returned call to patient Patient is requesting a referral to the eye dr Has been having to strain when she reads Referral placed in epic

## 2015-09-01 NOTE — Telephone Encounter (Signed)
Pt. Came in today requesting a referral to an optometrist. Please f/u

## 2015-09-05 ENCOUNTER — Ambulatory Visit (INDEPENDENT_AMBULATORY_CARE_PROVIDER_SITE_OTHER): Payer: Self-pay | Admitting: Cardiology

## 2015-09-05 ENCOUNTER — Encounter: Payer: Self-pay | Admitting: Cardiology

## 2015-09-05 VITALS — BP 122/80 | HR 84 | Ht 64.0 in | Wt 222.0 lb

## 2015-09-05 DIAGNOSIS — I340 Nonrheumatic mitral (valve) insufficiency: Secondary | ICD-10-CM

## 2015-09-05 DIAGNOSIS — E0590 Thyrotoxicosis, unspecified without thyrotoxic crisis or storm: Secondary | ICD-10-CM

## 2015-09-05 DIAGNOSIS — I480 Paroxysmal atrial fibrillation: Secondary | ICD-10-CM

## 2015-09-05 NOTE — Patient Instructions (Signed)

## 2015-09-05 NOTE — Progress Notes (Signed)
Patient ID: Olivia Schmidt, female   DOB: 10-29-1960, 55 y.o.   MRN: [id number]     09/05/2015 Trevon Mise Schmidt   October 02, 1960  [id number]  Primary Physician Lance Bosch, NP Primary Cardiologist: Dr. Meda Coffee  Reason for Visit/CC: 3 months follow-up for atrial fibrillation  HPI:  55 y/o AAF was diagnosed with new onset atrial fibrillation, in the setting of newly diagnosed hyperthyroidism with goiter as well as acute bronchitis in December 2016. TSH was 0.013. T3 was 451. Free T4 was 5.17. Internal medicine placed her on methimazole. She converted to SR with rate control therapy with Cardizem and atenolol. 2D echo showed normal LVEF of 50-55% + mild MR. Her CHA2DS2 VASc score is only 1 for female sex. ASA was advised. Outpatient endocrine consultation arranged for 06/01/15. She was recently seen by her endocrinologist will increase the dose of methimazole as her TSH remains low. However she feels significantly better. She was able to gain some weight back. She denies any palpitations or syncope. No chest pain or shortness of breath. She continues taking baby aspirin. No lower extremity edema, orthopnea or paroxysmal nocturnal dyspnea.   Current Outpatient Prescriptions  Medication Sig Dispense Refill  . aspirin EC 81 MG EC tablet Take 1 tablet (81 mg total) by mouth daily.    Marland Kitchen aspirin-acetaminophen-caffeine (EXCEDRIN MIGRAINE) 250-250-65 MG per tablet Take 1-2 tablets by mouth every 6 (six) hours as needed for headache. Reported on 09/01/2015    . ibuprofen (ADVIL,MOTRIN) 200 MG tablet Take 200 mg by mouth every 6 (six) hours as needed. Reported on 09/01/2015    . methimazole (TAPAZOLE) 10 MG tablet Take 1 tablet (10 mg total) by mouth 2 (two) times daily. 60 tablet 5   No current facility-administered medications for this visit.    No Known Allergies  Social History   Social History  . Marital Status: Married    Spouse Name: N/A  . Number of Children: N/A  . Years of Education: N/A    Occupational History  . Not on file.   Social History Main Topics  . Smoking status: Former Smoker -- 24 years    Quit date: 06/12/2003  . Smokeless tobacco: Never Used  . Alcohol Use: No  . Drug Use: No  . Sexual Activity: Yes    Birth Control/ Protection: Surgical   Other Topics Concern  . Not on file   Social History Narrative     Review of Systems: General: negative for chills, fever, night sweats or weight changes.  Cardiovascular: negative for chest pain, dyspnea on exertion, edema, orthopnea, palpitations, paroxysmal nocturnal dyspnea or shortness of breath Dermatological: negative for rash Respiratory: negative for cough or wheezing Urologic: negative for hematuria Abdominal: negative for nausea, vomiting, diarrhea, bright red blood per rectum, melena, or hematemesis Neurologic: negative for visual changes, syncope, or dizziness All other systems reviewed and are otherwise negative except as noted above.    Blood pressure 122/80, pulse 84, height 5\' 4"  (1.626 m), weight 222 lb (100.699 kg), last menstrual period 09/14/2010.  General appearance: alert, cooperative and no distress Neck: no JVD and bilateral carotid bruits, + goiter noted Lungs: clear to auscultation bilaterally Heart: regular rate and rhythm and 2/6 SM along LSB and apex Extremities: no LEE Pulses: 2+ and symmetric Skin: warm and dry Neurologic: Grossly normal  EKG NST 64 bpm.   ASSESSMENT AND PLAN:   1. Atrial Fibrillation: Paroxysmal in the settings of hyperthyroidism, patient remains in normal sinus rhythm and her dose  of methimazole was recently increased. Continue aspirin only. Her heart rate is 80 and she doesn't require any beta blockers at this point. Her CHA2DS2 VASc score is only 1 for female sex. Thus no indication for anticoagulation.   2. Hyperthyroidism: on methimazole. As above.  3. Cardiac murmur: recent echo showed mild MR. we will follow.   Follow up in 6  months.  Olivia Dawley H PA-C 09/05/2015 8:45 AM

## 2015-10-24 ENCOUNTER — Encounter (HOSPITAL_COMMUNITY): Payer: Self-pay | Admitting: Emergency Medicine

## 2015-10-24 ENCOUNTER — Ambulatory Visit (HOSPITAL_COMMUNITY)
Admission: EM | Admit: 2015-10-24 | Discharge: 2015-10-24 | Disposition: A | Discharge status: Home / Self Care | Payer: Self-pay | Attending: Family Medicine | Admitting: Family Medicine

## 2015-10-24 DIAGNOSIS — T148 Other injury of unspecified body region: Secondary | ICD-10-CM

## 2015-10-24 DIAGNOSIS — W57XXXA Bitten or stung by nonvenomous insect and other nonvenomous arthropods, initial encounter: Secondary | ICD-10-CM

## 2015-10-24 MED ORDER — CEPHALEXIN 500 MG PO CAPS: 500.0000 mg | ORAL_CAPSULE | Freq: Four times a day (QID) | ORAL | Status: DC

## 2015-10-24 MED ORDER — HYDROXYZINE HCL 10 MG PO TABS: 10.0000 mg | ORAL_TABLET | Freq: Three times a day (TID) | ORAL | Status: DC | PRN

## 2015-10-24 NOTE — ED Notes (Signed)
Noticed right little finger itching badly on Wednesday night.  Did not see an insect.  Since then has had continued itching, "burning" and swelling.

## 2015-10-24 NOTE — ED Provider Notes (Signed)
CSN: [id number]     Arrival date & time 10/24/15  1337 History   First MD Initiated Contact with Patient 10/24/15 1444     Chief Complaint  Patient presents with  . Insect Bite   (Consider location/radiation/quality/duration/timing/severity/associated sxs/prior Treatment) HPI History obtained from patient: Location: Right pinky finger  Context/Duration: 2-3 days ago awoke with swollen finger is extremely itchy initially but now more painful.  Severity: 3  Quality: Aching and itching Timing:      Constant      Home Treatment: None Associated symptoms:  He moves finger Family History: No family history of diabetes Social History: Previous smoker  Past Medical History  Diagnosis Date  . Fibroids   . PAF (paroxysmal atrial fibrillation) ([place]) 05/09/2015  . Atrial fibrillation with rapid ventricular response ([place])   . Thrombocytopenia ([place]) 05/09/2015  . Hypomagnesemia 05/08/2015  . Hypokalemia 05/08/2015  . Hyperthyroidism 05/07/2015  . Goiter   . FIBROIDS, UTERUS 07/11/2009    Qualifier: Diagnosis of  By: Amil Amen MD, Benjamine Mola    . FEMALE INFERTILITY 10/21/2008    Qualifier: Diagnosis of  By: Amil Amen MD, Benjamine Mola    . Chest pain 05/07/2015  . Anemia 05/07/2015  . Acute bronchitis 05/08/2015   Past Surgical History  Procedure Laterality Date  . Abdominal hysterectomy     Family History  Problem Relation Age of Onset  . Aneurysm Father     brain  . Hypertension Sister   . Hypertension Brother   . Thyroid disease Neg Hx    Social History  Substance Use Topics  . Smoking status: Former Smoker -- 24 years    Quit date: 06/12/2003  . Smokeless tobacco: Never Used  . Alcohol Use: No   OB History    Gravida Para Term Preterm AB TAB SAB Ectopic Multiple Living   1    1          Review of Systems ROS +'ve right pinky finger swelling and pain  Denies: HEADACHE, NAUSEA, ABDOMINAL PAIN, CHEST PAIN, CONGESTION, DYSURIA, SHORTNESS OF BREATH  Allergies  Review of  patient's allergies indicates no known allergies.  Home Medications   Prior to Admission medications   Medication Sig Start Date End Date Taking? Authorizing Provider  cyclobenzaprine (FLEXERIL) 10 MG tablet Take 10 mg by mouth 3 (three) times daily as needed for muscle spasms.   Yes Historical Provider, MD  aspirin EC 81 MG EC tablet Take 1 tablet (81 mg total) by mouth daily. 05/11/15   Eugenie Filler, MD  aspirin-acetaminophen-caffeine (EXCEDRIN MIGRAINE) [phone] MG per tablet Take 1-2 tablets by mouth every 6 (six) hours as needed for headache. Reported on 09/01/2015    Historical Provider, MD  cephALEXin (KEFLEX) 500 MG capsule Take 1 capsule (500 mg total) by mouth 4 (four) times daily. 10/24/15   Konrad Felix, PA  hydrOXYzine (ATARAX/VISTARIL) 10 MG tablet Take 1 tablet (10 mg total) by mouth 3 (three) times daily as needed. 10/24/15   Konrad Felix, PA  ibuprofen (ADVIL,MOTRIN) 200 MG tablet Take 200 mg by mouth every 6 (six) hours as needed. Reported on 09/01/2015    Historical Provider, MD  methimazole (TAPAZOLE) 10 MG tablet Take 1 tablet (10 mg total) by mouth 2 (two) times daily. 09/01/15   Renato Shin, MD   Meds Ordered and Administered this Visit  Medications - No data to display  BP 131/63 mmHg  Pulse 60  Temp(Src) 97.8 F (36.6 C) (Oral)  Resp 16  SpO2 99%  LMP 09/14/2010 No data found.   Physical Exam NURSES NOTES AND VITAL SIGNS REVIEWED. CONSTITUTIONAL: Well developed, well nourished, no acute distress HEENT: normocephalic, atraumatic EYES: Conjunctiva normal NECK:normal ROM, supple, no adenopathy PULMONARY:No respiratory distress, normal effort ABDOMINAL: Soft, ND, NT BS+, No CVAT MUSCULOSKELETAL: Normal ROM of all extremities, and hand pinky finger red swollen tender to palpation not fluctuant. It is a bit of redness extending above the MCP of the right hand fifth digit. No lymphangitic streaking. SKIN: warm and dry without rash PSYCHIATRIC: Mood and  affect, behavior are normal  ED Course  Procedures (including critical care time)  Labs Review Labs Reviewed - No data to display  Imaging Review No results found.   Visual Acuity Review  Right Eye Distance:   Left Eye Distance:   Bilateral Distance:    Right Eye Near:   Left Eye Near:    Bilateral Near:      Prescription for Atarax and Keflex   MDM   1. Insect bite   new  Patient is reassured that there are no issues that require transfer to higher level of care at this time or additional tests. Patient is advised to continue home symptomatic treatment. Patient is advised that if there are new or worsening symptoms to attend the emergency department, contact primary care provider, or return to UC. Instructions of care provided discharged home in stable condition.    THIS NOTE WAS GENERATED USING A VOICE RECOGNITION SOFTWARE PROGRAM. ALL REASONABLE EFFORTS  WERE MADE TO PROOFREAD THIS DOCUMENT FOR ACCURACY.  I have verbally reviewed the discharge instructions with the patient. A printed AVS was given to the patient.  All questions were answered prior to discharge.      Konrad Felix, PA 10/24/15 1510

## 2015-10-24 NOTE — Discharge Instructions (Signed)
Cellulitis °Cellulitis is an infection of the skin and the tissue under the skin. The infected area is usually red and tender. This happens most often in the arms and lower legs. °HOME CARE  °· Take your antibiotic medicine as told. Finish the medicine even if you start to feel better. °· Keep the infected arm or leg raised (elevated). °· Put a warm cloth on the area up to 4 times per day. °· Only take medicines as told by your doctor. °· Keep all doctor visits as told. °GET HELP IF: °· You see red streaks on the skin coming from the infected area. °· Your red area gets bigger or turns a dark color. °· Your bone or joint under the infected area is painful after the skin heals. °· Your infection comes back in the same area or different area. °· You have a puffy (swollen) bump in the infected area. °· You have new symptoms. °· You have a fever. °GET HELP RIGHT AWAY IF:  °· You feel very sleepy. °· You throw up (vomit) or have watery poop (diarrhea). °· You feel sick and have muscle aches and pains. °  °This information is not intended to replace advice given to you by your health care provider. Make sure you discuss any questions you have with your health care provider. °  °Document Released: 11/14/2007 Document Revised: 02/16/2015 Document Reviewed: 08/13/2011 °Elsevier Interactive Patient Education ©2016 [employer] ° °

## 2015-10-27 ENCOUNTER — Emergency Department (HOSPITAL_COMMUNITY): Payer: Self-pay

## 2015-10-27 ENCOUNTER — Encounter (HOSPITAL_COMMUNITY)
Admission: EM | Disposition: A | Discharge status: Home / Self Care | Payer: Self-pay | Source: Home / Self Care | Attending: Emergency Medicine

## 2015-10-27 ENCOUNTER — Emergency Department (HOSPITAL_COMMUNITY)
Admission: EM | Admit: 2015-10-27 | Discharge: 2015-10-27 | Disposition: A | Discharge status: Home / Self Care | Payer: Self-pay | Attending: Emergency Medicine | Admitting: Emergency Medicine

## 2015-10-27 ENCOUNTER — Emergency Department (HOSPITAL_COMMUNITY): Payer: Self-pay | Admitting: Certified Registered"

## 2015-10-27 ENCOUNTER — Encounter (HOSPITAL_COMMUNITY): Payer: Self-pay

## 2015-10-27 DIAGNOSIS — L02511 Cutaneous abscess of right hand: Secondary | ICD-10-CM | POA: Insufficient documentation

## 2015-10-27 DIAGNOSIS — Z7982 Long term (current) use of aspirin: Secondary | ICD-10-CM | POA: Insufficient documentation

## 2015-10-27 DIAGNOSIS — Z87891 Personal history of nicotine dependence: Secondary | ICD-10-CM | POA: Insufficient documentation

## 2015-10-27 DIAGNOSIS — Z7901 Long term (current) use of anticoagulants: Secondary | ICD-10-CM | POA: Insufficient documentation

## 2015-10-27 HISTORY — PX: I & D EXTREMITY: SHX5045

## 2015-10-27 LAB — CBC WITH DIFFERENTIAL/PLATELET
Basophils Absolute: 0 10*3/uL (ref 0.0–0.1)
Basophils Relative: 0 %
Eosinophils Absolute: 0.1 10*3/uL (ref 0.0–0.7)
Eosinophils Relative: 3 %
HEMATOCRIT: 37.7 % (ref 36.0–46.0)
HEMOGLOBIN: 11.9 g/dL — AB (ref 12.0–15.0)
LYMPHS ABS: 1.6 10*3/uL (ref 0.7–4.0)
Lymphocytes Relative: 28 %
MCH: 26.4 pg (ref 26.0–34.0)
MCHC: 31.6 g/dL (ref 30.0–36.0)
MCV: 83.6 fL (ref 78.0–100.0)
MONOS PCT: 7 %
Monocytes Absolute: 0.4 10*3/uL (ref 0.1–1.0)
NEUTROS ABS: 3.5 10*3/uL (ref 1.7–7.7)
NEUTROS PCT: 62 %
Platelets: 189 10*3/uL (ref 150–400)
RBC: 4.51 MIL/uL (ref 3.87–5.11)
RDW: 15.7 % — ABNORMAL HIGH (ref 11.5–15.5)
WBC: 5.6 10*3/uL (ref 4.0–10.5)

## 2015-10-27 LAB — BASIC METABOLIC PANEL
Anion gap: 7 (ref 5–15)
BUN: 13 mg/dL (ref 6–20)
CHLORIDE: 105 mmol/L (ref 101–111)
CO2: 28 mmol/L (ref 22–32)
CREATININE: 0.71 mg/dL (ref 0.44–1.00)
Calcium: 8.9 mg/dL (ref 8.9–10.3)
GFR calc Af Amer: >60 mL/min (ref >60)
GFR calc non Af Amer: >60 mL/min (ref >60)
Glucose, Bld: 98 mg/dL (ref 65–99)
Potassium: 4.1 mmol/L (ref 3.5–5.1)
Sodium: 140 mmol/L (ref 135–145)

## 2015-10-27 SURGERY — IRRIGATION AND DEBRIDEMENT EXTREMITY
Anesthesia: Regional | Site: Finger | Laterality: Right

## 2015-10-27 MED ORDER — ONDANSETRON HCL 4 MG/2ML IJ SOLN
4.0000 mg | Freq: Once | INTRAMUSCULAR | Status: DC | PRN
Start: 2015-10-27 — End: 2015-10-27

## 2015-10-27 MED ORDER — FENTANYL CITRATE (PF) 100 MCG/2ML IJ SOLN
INTRAMUSCULAR | Status: DC | PRN
  Administered 2015-10-27 (×2): 50 ug via INTRAVENOUS

## 2015-10-27 MED ORDER — CLINDAMYCIN PHOSPHATE 600 MG/50ML IV SOLN
600.0000 mg | Freq: Once | INTRAVENOUS | Status: AC
  Administered 2015-10-27: 600 mg via INTRAVENOUS
  Filled 2015-10-27: qty 50

## 2015-10-27 MED ORDER — MORPHINE SULFATE (PF) 4 MG/ML IV SOLN
4.0000 mg | Freq: Once | INTRAVENOUS | Status: AC
  Administered 2015-10-27: 4 mg via INTRAVENOUS
  Filled 2015-10-27: qty 1

## 2015-10-27 MED ORDER — DEXAMETHASONE SODIUM PHOSPHATE 10 MG/ML IJ SOLN
INTRAMUSCULAR | Status: DC | PRN
  Administered 2015-10-27: 10 mg via INTRAVENOUS

## 2015-10-27 MED ORDER — DEXAMETHASONE SODIUM PHOSPHATE 10 MG/ML IJ SOLN
INTRAMUSCULAR | Status: AC
  Filled 2015-10-27: qty 1

## 2015-10-27 MED ORDER — LACTATED RINGERS IV SOLN: INTRAVENOUS | Status: DC

## 2015-10-27 MED ORDER — LIDOCAINE HCL (PF) 2 % IJ SOLN
INTRAMUSCULAR | Status: DC | PRN
  Administered 2015-10-27: 30 mg via INTRADERMAL

## 2015-10-27 MED ORDER — OXYCODONE-ACETAMINOPHEN 5-325 MG PO TABS
1.0000 | ORAL_TABLET | Freq: Four times a day (QID) | ORAL | Status: DC | PRN
  Administered 2015-10-27: 1 via ORAL

## 2015-10-27 MED ORDER — LIDOCAINE HCL (CARDIAC) 20 MG/ML IV SOLN
INTRAVENOUS | Status: AC
  Filled 2015-10-27: qty 5

## 2015-10-27 MED ORDER — PROPOFOL 10 MG/ML IV BOLUS
INTRAVENOUS | Status: DC | PRN
  Administered 2015-10-27: 50 mg via INTRAVENOUS
  Administered 2015-10-27: 20 mg via INTRAVENOUS
  Administered 2015-10-27: 30 mg via INTRAVENOUS
  Administered 2015-10-27: 20 mg via INTRAVENOUS
  Administered 2015-10-27: 50 mg via INTRAVENOUS

## 2015-10-27 MED ORDER — 0.9 % SODIUM CHLORIDE (POUR BTL) OPTIME
TOPICAL | Status: DC | PRN
  Administered 2015-10-27: 1000 mL

## 2015-10-27 MED ORDER — SODIUM CHLORIDE 0.9 % IV BOLUS (SEPSIS)
500.0000 mL | Freq: Once | INTRAVENOUS | Status: AC
  Administered 2015-10-27: 500 mL via INTRAVENOUS

## 2015-10-27 MED ORDER — HYDROCODONE-ACETAMINOPHEN 7.5-325 MG PO TABS
1.0000 | ORAL_TABLET | Freq: Once | ORAL | Status: DC | PRN
Start: 2015-10-27 — End: 2015-10-27

## 2015-10-27 MED ORDER — ONDANSETRON HCL 4 MG/2ML IJ SOLN
INTRAMUSCULAR | Status: DC | PRN
  Administered 2015-10-27: 4 mg via INTRAVENOUS

## 2015-10-27 MED ORDER — BUPIVACAINE-EPINEPHRINE 0.5% -1:200000 IJ SOLN
INTRAMUSCULAR | Status: AC
  Filled 2015-10-27: qty 1

## 2015-10-27 MED ORDER — VANCOMYCIN HCL 1000 MG IV SOLR
1000.0000 mg | INTRAVENOUS | Status: DC | PRN
  Administered 2015-10-27: 1000 mg via INTRAVENOUS

## 2015-10-27 MED ORDER — MIDAZOLAM HCL 5 MG/5ML IJ SOLN
INTRAMUSCULAR | Status: DC | PRN
  Administered 2015-10-27: 2 mg via INTRAVENOUS

## 2015-10-27 MED ORDER — PROPOFOL 10 MG/ML IV BOLUS
INTRAVENOUS | Status: AC
  Filled 2015-10-27: qty 20

## 2015-10-27 MED ORDER — BUPIVACAINE HCL (PF) 0.25 % IJ SOLN
INTRAMUSCULAR | Status: AC
  Filled 2015-10-27: qty 30

## 2015-10-27 MED ORDER — MIDAZOLAM HCL 2 MG/2ML IJ SOLN
INTRAMUSCULAR | Status: AC
  Filled 2015-10-27: qty 2

## 2015-10-27 MED ORDER — OXYCODONE-ACETAMINOPHEN 5-325 MG PO TABS
ORAL_TABLET | ORAL | Status: AC
  Filled 2015-10-27: qty 1

## 2015-10-27 MED ORDER — HYDROXYZINE HCL 25 MG PO TABS: 25.0000 mg | ORAL_TABLET | Freq: Once | ORAL | Status: DC

## 2015-10-27 MED ORDER — BUPIVACAINE HCL (PF) 0.25 % IJ SOLN
INTRAMUSCULAR | Status: DC | PRN
  Administered 2015-10-27: 10 mL

## 2015-10-27 MED ORDER — VANCOMYCIN HCL IN DEXTROSE 1-5 GM/200ML-% IV SOLN
INTRAVENOUS | Status: AC
  Filled 2015-10-27: qty 200

## 2015-10-27 MED ORDER — BUPIVACAINE-EPINEPHRINE (PF) 0.5% -1:200000 IJ SOLN
INTRAMUSCULAR | Status: DC | PRN
  Administered 2015-10-27: 35 mL via PERINEURAL

## 2015-10-27 MED ORDER — OXYCODONE-ACETAMINOPHEN 10-325 MG PO TABS: ORAL_TABLET | ORAL | Status: DC

## 2015-10-27 MED ORDER — LIDOCAINE HCL (PF) 1 % IJ SOLN
5.0000 mL | Freq: Once | INTRAMUSCULAR | Status: AC
  Administered 2015-10-27: 5 mL via INTRADERMAL
  Filled 2015-10-27: qty 30

## 2015-10-27 MED ORDER — FENTANYL CITRATE (PF) 100 MCG/2ML IJ SOLN
50.0000 ug | Freq: Once | INTRAMUSCULAR | Status: AC
  Administered 2015-10-27: 50 ug via INTRAVENOUS
  Filled 2015-10-27: qty 2

## 2015-10-27 MED ORDER — FENTANYL CITRATE (PF) 100 MCG/2ML IJ SOLN
INTRAMUSCULAR | Status: AC
  Filled 2015-10-27: qty 2

## 2015-10-27 MED ORDER — FENTANYL CITRATE (PF) 100 MCG/2ML IJ SOLN: 25.0000 ug | INTRAMUSCULAR | Status: DC | PRN

## 2015-10-27 MED ORDER — MEPERIDINE HCL 50 MG/ML IJ SOLN: 6.2500 mg | INTRAMUSCULAR | Status: DC | PRN

## 2015-10-27 MED ORDER — LACTATED RINGERS IV SOLN
INTRAVENOUS | Status: DC | PRN
  Administered 2015-10-27: 18:00:00 via INTRAVENOUS

## 2015-10-27 MED ORDER — SULFAMETHOXAZOLE-TRIMETHOPRIM 800-160 MG PO TABS: 1.0000 | ORAL_TABLET | Freq: Two times a day (BID) | ORAL | Status: DC

## 2015-10-27 MED ORDER — ONDANSETRON HCL 4 MG/2ML IJ SOLN
INTRAMUSCULAR | Status: AC
  Filled 2015-10-27: qty 2

## 2015-10-27 SURGICAL SUPPLY — 39 items
BAG SPEC THK2 15X12 ZIP CLS (MISCELLANEOUS) ×1
BAG ZIPLOCK 12X15 (MISCELLANEOUS) ×3 IMPLANT
BANDAGE ACE 4X5 VEL STRL LF (GAUZE/BANDAGES/DRESSINGS) ×3 IMPLANT
BANDAGE ELASTIC 4 VELCRO ST LF (GAUZE/BANDAGES/DRESSINGS) ×2 IMPLANT
BNDG COHESIVE 4X5 TAN STRL (GAUZE/BANDAGES/DRESSINGS) ×1 IMPLANT
BNDG GAUZE ELAST 4 BULKY (GAUZE/BANDAGES/DRESSINGS) ×3 IMPLANT
CANNULA VESSEL W/WING WO/VALVE (CANNULA) ×3 IMPLANT
CLEANER TIP ELECTROSURG 2X2 (MISCELLANEOUS) ×1 IMPLANT
CORDS BIPOLAR (ELECTRODE) ×3 IMPLANT
CUFF TOURN SGL QUICK 18 (TOURNIQUET CUFF) ×3 IMPLANT
CUFF TOURN SGL QUICK 24 (TOURNIQUET CUFF)
CUFF TRNQT CYL 24X4X40X1 (TOURNIQUET CUFF) ×1 IMPLANT
DRAIN PENROSE 18X1/2 LTX STRL (DRAIN) ×1 IMPLANT
DRSG PAD ABDOMINAL 8X10 ST (GAUZE/BANDAGES/DRESSINGS) ×3 IMPLANT
ELECT REM PT RETURN 9FT ADLT (ELECTROSURGICAL) ×3
ELECTRODE REM PT RTRN 9FT ADLT (ELECTROSURGICAL) ×1 IMPLANT
GAUZE IODOFORM PACK 1/2 7832 (GAUZE/BANDAGES/DRESSINGS) ×3 IMPLANT
GAUZE PACKING IODOFORM 1/4X15 (GAUZE/BANDAGES/DRESSINGS) ×3 IMPLANT
GAUZE SPONGE 4X4 12PLY STRL (GAUZE/BANDAGES/DRESSINGS) ×3 IMPLANT
GAUZE XEROFORM 5X9 LF (GAUZE/BANDAGES/DRESSINGS) ×1 IMPLANT
GLOVE BIO SURGEON STRL SZ7.5 (GLOVE) ×6 IMPLANT
GLOVE BIOGEL PI IND STRL 8 (GLOVE) ×1 IMPLANT
GLOVE BIOGEL PI INDICATOR 8 (GLOVE) ×2
GOWN SPEC L3 XXLG W/TWL (GOWN DISPOSABLE) ×3 IMPLANT
HANDPIECE INTERPULSE COAX TIP (DISPOSABLE)
KIT BASIN OR (CUSTOM PROCEDURE TRAY) ×3 IMPLANT
MANIFOLD NEPTUNE II (INSTRUMENTS) ×3 IMPLANT
PACK ORTHO EXTREMITY (CUSTOM PROCEDURE TRAY) ×3 IMPLANT
PAD CAST 4YDX4 CTTN HI CHSV (CAST SUPPLIES) ×1 IMPLANT
PADDING CAST ABS 3INX4YD NS (CAST SUPPLIES) ×2
PADDING CAST ABS COTTON 3X4 (CAST SUPPLIES) IMPLANT
PADDING CAST COTTON 4X4 STRL (CAST SUPPLIES)
SET HNDPC FAN SPRY TIP SCT (DISPOSABLE) ×1 IMPLANT
SPLINT PLASTER EXTRA FAST 3X15 (CAST SUPPLIES) ×2
SPLINT PLASTER GYPS XFAST 3X15 (CAST SUPPLIES) IMPLANT
SUT ETHILON 4 0 PS 2 18 (SUTURE) ×3 IMPLANT
SUT SILK 4 0 PS 2 (SUTURE) ×3 IMPLANT
SYR 20CC LL (SYRINGE) ×3 IMPLANT
SYR CONTROL 10ML LL (SYRINGE) ×3 IMPLANT

## 2015-10-27 NOTE — Op Note (Signed)
[name], Schmidt NO.:  [id number]  MEDICAL RECORD NO.:  [id number]  LOCATION:  [facility]                         FACILITY:  [facility]  PHYSICIAN:  Leanora Cover, MD        DATE OF BIRTH:  04/28/1961  DATE OF PROCEDURE:  10/27/2015 DATE OF DISCHARGE:                              OPERATIVE REPORT   PREOPERATIVE DIAGNOSIS:  Right small finger abscess.  POSTOPERATIVE DIAGNOSIS:  Right small finger abscess.  PROCEDURE:  Incision and drainage, right small finger abscess.  SURGEON:  Leanora Cover, MD  ASSISTANT:  None.  ANESTHESIA:  Regional with local.  IV FLUIDS:  Per anesthesia flow sheet.  ESTIMATED BLOOD LOSS:  Minimal.  COMPLICATIONS:  None.  SPECIMENS:  Cultures to micro.  TOURNIQUET TIME:  18 minutes.  DISPOSITION:  Stable to PACU.  INDICATIONS:  [name] is a 55 year old right-hand-dominant female who thinks she was bitten by an insect approximately 1 week ago.  Over the past 4 days, she has had increasing swelling, pain, and erythema of the finger with a draining wound.  She was seen in [facility] Emergency Department where small incision and drainage was performed expressing purulence.  I was consulted for management of the condition.  I recommended incision and drainage in the operating room.  Risks, benefits, and alternatives of surgery were discussed including risk of blood loss; infection; damage to nerves, vessels, tendons, ligaments, bone; failure of surgery; need for additional surgery; complications with wound healing; continued infection; need for repeat irrigation and debridement.  She voiced understanding of these risks and elected to proceed.  OPERATIVE COURSE:  After being identified preoperatively by myself, the patient and I agreed upon procedure and site procedure.  Surgical site was marked.  Risks, benefits, and alternatives of surgery were reviewed and she wished to proceed.  Surgical consent had been signed.  She was given a  regional block by anesthesia in the preoperative holding.  She was transferred to the operating room and she was kept on the stretcher and her arm placed on a padded Mayo.  Right upper extremity was prepped and draped in normal sterile orthopedic fashion.  Surgical pause was performed between surgeons, anesthesia, and operating room staff, and all were in agreement as to patient, procedure, and site of procedure. Tourniquet at the proximal aspect of the extremity was inflated to 250 mmHg after exsanguination of limb with an Esmarch bandage.  The regional block was augmented with 9 mL of 0.25% plain Marcaine as a digital block.  The wound was extended both proximally and distally at the dorsal ulnar aspect of the small finger over the proximal phalanx and proximally onto the distal aspect of the hand.  This was carried into subcutaneous tissues by spreading technique.  Bipolar electrocautery was used to obtain hemostasis.  Gross purulence was encountered.  Cultures were taken for aerobes, anaerobes, and gram stain.  The abscess cavity was well defined.  It did not appear course deep to the extensor tendon. The subcutaneous tissues were debrided with a sponge.  The wound portion of the infection was debrided including the skin and subcutaneous tissue sharply with scissors.  The wound was then copiously  irrigated with sterile saline and packed with quarter-inch iodoform gauze.  It was dressed with sterile 4x4s and wrapped with Kerlix bandage.  A volar splint was placed with the long ring and small fingers included.  This was wrapped with Kerlix and Ace bandage.  Tourniquet was deflated at 18 minutes.  Fingertips were pink with brisk capillary refill after deflation of the tourniquet.  Operative drapes were broken down.  The patient was awoken from anesthesia safely.  She was transferred back to stretcher and taken to PACU in stable condition.  I will see her back in the office on 4 days for  postoperative followup and wound care.  I will give her Percocet 10/325, 1-2 p.o. q.6 hours p.r.n. pain, dispensed #30 and Bactrim DS 1 p.o. b.i.d. x7 days.     Leanora Cover, MD     KK/MEDQ  D:  10/27/2015  T:  10/27/2015  Job:  [id number]

## 2015-10-27 NOTE — Anesthesia Procedure Notes (Addendum)
Anesthesia Regional Block:  Axillary brachial plexus block  Pre-Anesthetic Checklist: ,, timeout performed, Correct Patient, Correct Site, Correct Laterality, Correct Procedure, Correct Position, site marked, Risks and benefits discussed, Surgical consent,  Pre-op evaluation,  At surgeon's request  Laterality: Right  Prep: chloraprep       Needles:  Injection technique: Single-shot  Needle Type: Echogenic Stimulator Needle     Needle Length: 9cm 9 cm Needle Gauge: 21 and 21 G    Additional Needles:  Procedures: ultrasound guided (picture in chart) Axillary brachial plexus block Narrative:  Injection made incrementally with aspirations every 5 mL.  Performed by: Personally  Anesthesiologist: Josephine Igo  Additional Notes: Patient tolerated procedure well. Relevant anatomy Identified. Incremental 63ml injection with frequent aspiration.

## 2015-10-27 NOTE — Discharge Instructions (Signed)

## 2015-10-27 NOTE — ED Notes (Signed)
Pt c/o increasing R hand pain and swelling r/t insect bite x 8 days ago.  Pain score 8/10.  Pt has not taken anything for pain.  Pt was seen x 3 day ago at Urgent Care for same and prescribed Keflex and Vistaril.  Pt reports taking both medications w/o relief.

## 2015-10-27 NOTE — Anesthesia Postprocedure Evaluation (Signed)
Anesthesia Post Note  Patient: Olivia Schmidt  Procedure(s) Performed: Procedure(s) (LRB): IRRIGATION AND DEBRIDEMENT RIGHT SMALL FINGER (Right)  Patient location during evaluation: PACU Anesthesia Type: Regional Level of consciousness: awake and alert and oriented Pain management: pain level controlled Vital Signs Assessment: post-procedure vital signs reviewed and stable Respiratory status: spontaneous breathing, nonlabored ventilation and respiratory function stable Cardiovascular status: blood pressure returned to baseline and stable Postop Assessment: no signs of nausea or vomiting Anesthetic complications: no    Last Vitals:  Filed Vitals:   10/27/15 1830 10/27/15 1845  BP: 150/83 148/94  Pulse: 76 78  Temp:    Resp: 10 11    Last Pain:  Filed Vitals:   10/27/15 1858  PainSc: 10-Worst pain ever                 Syed Zukas A.

## 2015-10-27 NOTE — Brief Op Note (Signed)
10/27/2015  6:23 PM  PATIENT:  Ahmani E Minor  55 y.o. female  PRE-OPERATIVE DIAGNOSIS:  right small finger abscess  POST-OPERATIVE DIAGNOSIS:  right small finger abscess  PROCEDURE:  Procedure(s) with comments: IRRIGATION AND DEBRIDEMENT RIGHT SMALL FINGER (Right) - With local block   SURGEON:  Surgeon(s) and Role:    * Leanora Cover, MD - Primary  PHYSICIAN ASSISTANT:   ASSISTANTS: none   ANESTHESIA:   local and regional  EBL:     BLOOD ADMINISTERED:none  DRAINS: iodoform packing   LOCAL MEDICATIONS USED:  MARCAINE     SPECIMEN:  Source of Specimen:  right small finger  DISPOSITION OF SPECIMEN:  micro  COUNTS:  YES  TOURNIQUET:  * Missing tourniquet times found for documented tourniquets in log:  [id number] *  DICTATION: .Other Dictation: Dictation Number [id number]  PLAN OF CARE: Discharge to home after PACU  PATIENT DISPOSITION:  PACU - hemodynamically stable.

## 2015-10-27 NOTE — Transfer of Care (Signed)
Immediate Anesthesia Transfer of Care Note  Patient: Olivia Schmidt  Procedure(s) Performed: Procedure(s) with comments: IRRIGATION AND DEBRIDEMENT RIGHT SMALL FINGER (Right) - With local block  Patient Location: PACU  Anesthesia Type:MAC, with Ax block  Level of Consciousness:  sedated, patient cooperative and responds to stimulation  Airway & Oxygen Therapy:Patient Spontanous Breathing   Post-op Assessment:  Report given to PACU RN and Post -op Vital signs reviewed and stable  Post vital signs:  Reviewed and stable  Last Vitals:  Filed Vitals:   10/27/15 1314 10/27/15 1530  BP: 140/73 132/82  Pulse: 69 70  Temp:  36.8 C  Resp: 16 19    Complications: No apparent anesthesia complications

## 2015-10-27 NOTE — H&P (Signed)
Olivia Schmidt is an 55 y.o. female.   Chief Complaint: right small finger infection HPI: 55 yo rhd female present with husband states she thinks she was bitten on right small finger last week by an insect.  She did not see it however.  Has had increasing pain, swelling, erythema of right small finger over proximal phalanx over past four days.   Seen at [facility] and started on keflex four days ago.  Describes pain as throbbing and 8/10 in severity.  Worsened with use and palpation, relieved with pain medication.  Case discussed with Delsa Grana, PA-C and her note from 10/27/15 reviewed. Xrays viewed and interpreted by me: AP/lateral/oblique views of right hand show no fractures, dislocations.  No signs of osteomyelitis.  Possible foreign matter at ulnar side of small finger. Labs reviewed: WBC 5.6  Allergies: No Known Allergies  Past Medical History  Diagnosis Date  . Fibroids   . PAF (paroxysmal atrial fibrillation) ([place]) 05/09/2015  . Atrial fibrillation with rapid ventricular response ([place])   . Thrombocytopenia ([place]) 05/09/2015  . Hypomagnesemia 05/08/2015  . Hypokalemia 05/08/2015  . Hyperthyroidism 05/07/2015  . Goiter   . FIBROIDS, UTERUS 07/11/2009    Qualifier: Diagnosis of  By: Amil Amen MD, Benjamine Mola    . FEMALE INFERTILITY 10/21/2008    Qualifier: Diagnosis of  By: Amil Amen MD, Benjamine Mola    . Chest pain 05/07/2015  . Anemia 05/07/2015  . Acute bronchitis 05/08/2015    Past Surgical History  Procedure Laterality Date  . Abdominal hysterectomy      Family History: Family History  Problem Relation Age of Onset  . Aneurysm Father     brain  . Hypertension Sister   . Hypertension Brother   . Thyroid disease Neg Hx     Social History:   reports that she quit smoking about 12 years ago. She has never used smokeless tobacco. She reports that she does not drink alcohol or use illicit drugs.  Medications:  (Not in a hospital admission)  Results for orders placed or  performed during the hospital encounter of 10/27/15 (from the past 48 hour(s))  CBC with Differential     Status: Abnormal   Collection Time: 10/27/15 12:05 PM  Result Value Ref Range   WBC 5.6 4.0 - 10.5 K/uL   RBC 4.51 3.87 - 5.11 MIL/uL   Hemoglobin 11.9 (L) 12.0 - 15.0 g/dL   HCT 37.7 36.0 - 46.0 %   MCV 83.6 78.0 - 100.0 fL   MCH 26.4 26.0 - 34.0 pg   MCHC 31.6 30.0 - 36.0 g/dL   RDW 15.7 (H) 11.5 - 15.5 %   Platelets 189 150 - 400 K/uL   Neutrophils Relative % 62 %   Neutro Abs 3.5 1.7 - 7.7 K/uL   Lymphocytes Relative 28 %   Lymphs Abs 1.6 0.7 - 4.0 K/uL   Monocytes Relative 7 %   Monocytes Absolute 0.4 0.1 - 1.0 K/uL   Eosinophils Relative 3 %   Eosinophils Absolute 0.1 0.0 - 0.7 K/uL   Basophils Relative 0 %   Basophils Absolute 0.0 0.0 - 0.1 K/uL  Basic metabolic panel     Status: None   Collection Time: 10/27/15 12:05 PM  Result Value Ref Range   Sodium 140 135 - 145 mmol/L   Potassium 4.1 3.5 - 5.1 mmol/L   Chloride 105 101 - 111 mmol/L   CO2 28 22 - 32 mmol/L   Glucose, Bld 98 65 - 99 mg/dL  BUN 13 6 - 20 mg/dL   Creatinine, Ser 0.71 0.44 - 1.00 mg/dL   Calcium 8.9 8.9 - 10.3 mg/dL   GFR calc non Af Amer >60 >60 mL/min   GFR calc Af Amer >60 >60 mL/min    Comment: (NOTE) The eGFR has been calculated using the CKD EPI equation. This calculation has not been validated in all clinical situations. eGFR's persistently <60 mL/min signify possible Chronic Kidney Disease.    Anion gap 7 5 - 15    Dg Hand Complete Right  10/27/2015  CLINICAL DATA:  Pain and swelling medially after animal bite EXAM: RIGHT HAND - COMPLETE 3+ VIEW COMPARISON:  Right wrist May 23, 2006 FINDINGS: Frontal, oblique, and lateral views were obtained. There is marked soft tissue swelling medially and dorsally with the greatest degree of swelling involving the fifth proximal phalanx. There is a subtle radiopaque foreign body in the superficial soft tissues medial to the midportion of the  fifth proximal phalanx. There is no soft tissue air present. There is no demonstrable fracture or dislocation. 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 Osteoarthritic changes also noted in the scaphotrapezial and first carpal -metacarpal joints. There is no erosive change or bony destruction. IMPRESSION: Extensive soft tissue swelling dorsally and medially with the greatest degree of soft tissue swelling in the proximal aspect of the fifth digit. Small radiopaque foreign body noted in the soft tissues medial to the midportion of the fifth proximal phalanx superficially. No soft tissue air or abscess is evident. No bony destruction. 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 No acute fracture or dislocation. Electronically Signed   By: Lowella Grip III M.D.   On: 10/27/2015 12:57     A comprehensive review of systems was negative. Review of Systems: No fevers, chills, night sweats, chest pain, shortness of breath, nausea, vomiting, diarrhea, constipation, easy bleeding or bruising, headaches, dizziness, vision changes, fainting.   Blood pressure 132/82, pulse 70, temperature 98.3 F (36.8 C), temperature source Oral, resp. rate 19, height 5' 4"  (1.626 m), weight 99.791 kg (220 lb), last menstrual period 09/14/2010, SpO2 99 %.  General appearance: alert, cooperative and appears stated age Head: Normocephalic, without obvious abnormality, atraumatic Neck: supple, symmetrical, trachea midline Resp: clear to auscultation bilaterally Cardio: regular rate and rhythm GI: non-tender Extremities: Intact sensation and capillary refill all digits.  +epl/fpl/io.  Swelling and erythema of right small finger.  Wound on ulnar side of proximal phalanx with purulent drainage.  No proximal streaking.  ttp mostly dorsally over proximal phalanx.  Minimal volar tenderness. Pulses: 2+ and symmetric Skin: Skin color, texture, turgor normal. No  rashes or lesions Neurologic: Grossly normal Incision/Wound: As above  Assessment/Plan Right small finger abscess.  Recommend OR for incision and drainage.  Risks, benefits, and alternatives of surgery were discussed and the patient agrees with the plan of care.   Jahred Tatar R 10/27/2015, 5:15 PM

## 2015-10-27 NOTE — ED Provider Notes (Signed)
CSN: [id number]     Arrival date & time 10/27/15  0932 History   First MD Initiated Contact with Patient 10/27/15 1140     Chief Complaint  Patient presents with  . Insect Bite     (Consider location/radiation/quality/duration/timing/severity/associated sxs/prior Treatment) HPI   Olivia Schmidt is a 55 year old female who presents to emergency room with worsening right pinky pain, swelling and redness with some purulent drainage this morning, and improved with 2 days of Keflex antibiotic treatment, and ongoing for the past 8 days. Swelling first began as a suspected mosquito bite, was pruritic. She was seen 2 days ago allergic care after he had become much more swollen, red and painful. She states that has not improved over the last 2 days, and redness and swelling have extended into the top of her hand with pain that shoots up towards her wrist.  Pain described as tight, stabbing and burning, rated 10 out of 10, worse with any movement or palpation. Improving factors. She soaked it one time this morning and had some drainage but pain and swelling did not overall appeared to improve. She denies history of diabetes. No history of poor wound healing. No fever, nausea, chills, sweats. She denies any numbness or tingling. She has some restricted range of motion of her pinky secondary to swelling.  Location:Right pinky finger extending and spreading to top of right hand Context/Duration:One week ago the pt had a possible insect bite on right pinky that was itchy, it subsequently became swollen finger painful, which prompted her to seek evaluation 2 days ago at urgent care.  She was given Keflex and has been taking as prescribed however has not had any improvement. She is having swelling and redness extend up into her hand with severe pain. Severity:10/10 Quality: tight, stabbing, burning Timing: Constant  Tx:  Keflex for 2 days, warm soak this am with some purulent drainage, no  relief of pain Associated symptoms: none, no fever, chills, sweats, N, V Family History: No family history of diabetes Social History: Previous smoker   Past Medical History  Diagnosis Date  . Fibroids   . PAF (paroxysmal atrial fibrillation) ([place]) 05/09/2015  . Atrial fibrillation with rapid ventricular response ([place])   . Thrombocytopenia ([place]) 05/09/2015  . Hypomagnesemia 05/08/2015  . Hypokalemia 05/08/2015  . Hyperthyroidism 05/07/2015  . Goiter   . FIBROIDS, UTERUS 07/11/2009    Qualifier: Diagnosis of  By: Amil Amen MD, Benjamine Mola    . FEMALE INFERTILITY 10/21/2008    Qualifier: Diagnosis of  By: Amil Amen MD, Benjamine Mola    . Chest pain 05/07/2015  . Anemia 05/07/2015  . Acute bronchitis 05/08/2015   Past Surgical History  Procedure Laterality Date  . Abdominal hysterectomy     Family History  Problem Relation Age of Onset  . Aneurysm Father     brain  . Hypertension Sister   . Hypertension Brother   . Thyroid disease Neg Hx    Social History  Substance Use Topics  . Smoking status: Former Smoker -- 24 years    Quit date: 06/12/2003  . Smokeless tobacco: Never Used  . Alcohol Use: No   OB History    Gravida Para Term Preterm AB TAB SAB Ectopic Multiple Living   1    1          Review of Systems  All other systems reviewed and are negative.     Allergies  Review of patient's allergies indicates no known allergies.  Home Medications  Prior to Admission medications   Medication Sig Start Date End Date Taking? Authorizing Provider  aspirin EC 81 MG EC tablet Take 1 tablet (81 mg total) by mouth daily. 05/11/15  Yes Eugenie Filler, MD  aspirin-acetaminophen-caffeine (EXCEDRIN MIGRAINE) [phone] MG per tablet Take 1-2 tablets by mouth every 6 (six) hours as needed for headache. Reported on 09/01/2015   Yes Historical Provider, MD  cephALEXin (KEFLEX) 500 MG capsule Take 1 capsule (500 mg total) by mouth 4 (four) times daily. 10/24/15  Yes Konrad Felix,  PA  cyclobenzaprine (FLEXERIL) 10 MG tablet Take 10 mg by mouth 3 (three) times daily as needed for muscle spasms.   Yes Historical Provider, MD  hydrOXYzine (ATARAX/VISTARIL) 10 MG tablet Take 1 tablet (10 mg total) by mouth 3 (three) times daily as needed. Patient taking differently: Take 10 mg by mouth 3 (three) times daily as needed for itching.  10/24/15  Yes Konrad Felix, PA  ibuprofen (ADVIL,MOTRIN) 200 MG tablet Take 200 mg by mouth every 6 (six) hours as needed. Reported on 09/01/2015   Yes Historical Provider, MD  methimazole (TAPAZOLE) 10 MG tablet Take 1 tablet (10 mg total) by mouth 2 (two) times daily. 09/01/15  Yes Renato Shin, MD   BP 132/82 mmHg  Pulse 70  Temp(Src) 98.3 F (36.8 C) (Oral)  Resp 19  Ht 5\' 4"  (1.626 m)  Wt 99.791 kg  BMI 37.74 kg/m2  SpO2 99%  LMP 09/14/2010 Physical Exam  Constitutional: She is oriented to person, place, and time. She appears well-developed and well-nourished. No distress.  HENT:  Head: Normocephalic and atraumatic.  Right Ear: External ear normal.  Left Ear: External ear normal.  Nose: Nose normal.  Mouth/Throat: Oropharynx is clear and moist. No oropharyngeal exudate.  Eyes: Conjunctivae and EOM are normal. Pupils are equal, round, and reactive to light. Right eye exhibits no discharge. Left eye exhibits no discharge. No scleral icterus.  Neck: Normal range of motion. Neck supple. No JVD present. No tracheal deviation present.  Cardiovascular: Normal rate and regular rhythm.   Pulmonary/Chest: Effort normal and breath sounds normal. No stridor. No respiratory distress.  Musculoskeletal: She exhibits edema and tenderness.       Right hand: She exhibits decreased range of motion, tenderness and swelling. She exhibits normal capillary refill and no deformity. Normal sensation noted.       Hands: Lymphadenopathy:    She has no cervical adenopathy.  Neurological: She is alert and oriented to person, place, and time. She exhibits  normal muscle tone. Coordination normal.  Skin: Skin is warm and dry. No rash noted. She is not diaphoretic. No erythema. No pallor.  Psychiatric: She has a normal mood and affect. Her behavior is normal. Judgment and thought content normal.  Nursing note and vitals reviewed.               ED Course  Procedures (including critical care time)  INCISION AND DRAINAGE Performed by: Delsa Grana Consent: Verbal consent obtained. Risks and benefits: risks, benefits and alternatives were discussed Time out performed prior to procedure Type: abscess Body area: right 5th finger Anesthesia: local infiltration Incision was made with a scalpel. Local anesthetic: lidocaine 1% w/o epinephrine Anesthetic total: 2 ml Complexity: complex Blunt dissection to break up loculations Drainage: purulent Drainage amount: moderate Packing material: n/a Patient tolerance: Patient tolerated the procedure well with no immediate complications.     Labs Review Labs Reviewed  CBC WITH DIFFERENTIAL/PLATELET - Abnormal; Notable for the  following:    Hemoglobin 11.9 (*)    RDW 15.7 (*)    All other components within normal limits  BASIC METABOLIC PANEL    Imaging Review Dg Hand Complete Right  10/27/2015  CLINICAL DATA:  Pain and swelling medially after animal bite EXAM: RIGHT HAND - COMPLETE 3+ VIEW COMPARISON:  Right wrist May 23, 2006 FINDINGS: Frontal, oblique, and lateral views were obtained. There is marked soft tissue swelling medially and dorsally with the greatest degree of swelling involving the fifth proximal phalanx. There is a subtle radiopaque foreign body in the superficial soft tissues medial to the midportion of the fifth proximal phalanx. There is no soft tissue air present. There is no demonstrable fracture or dislocation. 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 Osteoarthritic changes also noted in the scaphotrapezial and first carpal -metacarpal joints.  There is no erosive change or bony destruction. IMPRESSION: Extensive soft tissue swelling dorsally and medially with the greatest degree of soft tissue swelling in the proximal aspect of the fifth digit. Small radiopaque foreign body noted in the soft tissues medial to the midportion of the fifth proximal phalanx superficially. No soft tissue air or abscess is evident. No bony destruction. 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 No acute fracture or dislocation. Electronically Signed   By: Lowella Grip III M.D.   On: 10/27/2015 12:57   I have personally reviewed and evaluated these images and lab results as part of my medical decision-making.   EKG Interpretation None      MDM   Pt with right pinky finger infection with extension of edema, erythema and pain into dorsum of hand.  2 days of keflex has not improved appearance or pain, presents because of worsening.  I&D attempted with moderate of purulent drainage, however pockets of abscess appear to extend over dorsum of finger   Pt was given morphine 4 mg x 2 and then fentanyl 50 mg In the ER she received 1 dose of IV clindamycin Medications  sodium chloride 0.9 % bolus 500 mL (0 mLs Intravenous Stopped 10/27/15 1307)  morphine 4 MG/ML injection 4 mg (4 mg Intravenous Given 10/27/15 1220)  morphine 4 MG/ML injection 4 mg (4 mg Intravenous Given 10/27/15 1425)  clindamycin (CLEOCIN) IVPB 600 mg (0 mg Intravenous Stopped 10/27/15 1455)  lidocaine (PF) (XYLOCAINE) 1 % injection 5 mL (5 mLs Intradermal Given 10/27/15 1425)  fentaNYL (SUBLIMAZE) injection 50 mcg (50 mcg Intravenous Given 10/27/15 1558)    Dr. Fredna Dow consulted, who reviewed imaging and pictures of the patient's finger, will take her to the OR for further I&D, will likely d/c home after OR, Dr. Fredna Dow to dispo and arrange f/up, abx, etc.   Final diagnoses:  Abscess of finger of right hand     Delsa Grana, PA-C 10/27/15  [place], MD 11/01/15 1317

## 2015-10-27 NOTE — Op Note (Signed)
[id number] 

## 2015-10-27 NOTE — Anesthesia Preprocedure Evaluation (Signed)
Anesthesia Evaluation  Patient identified by MRN, date of birth, ID band Patient awake    Reviewed: Allergy & Precautions, NPO status , Patient's Chart, lab work & pertinent test results  Airway Mallampati: III  TM Distance: >3 FB Neck ROM: Full    Dental no notable dental hx. (+) Teeth Intact   Pulmonary former smoker,    Pulmonary exam normal breath sounds clear to auscultation       Cardiovascular Normal cardiovascular exam+ dysrhythmias Atrial Fibrillation  Rhythm:Regular Rate:Normal     Neuro/Psych negative neurological ROS  negative psych ROS   GI/Hepatic negative GI ROS, Neg liver ROS,   Endo/Other  Hyperthyroidism Goiter  Renal/GU negative Renal ROS  negative genitourinary   Musculoskeletal Infected Right Small finger   Abdominal (+) + obese,   Peds  Hematology  (+) anemia ,   Anesthesia Other Findings   Reproductive/Obstetrics Uterine fibroids                             Anesthesia Physical Anesthesia Plan  ASA: II  Anesthesia Plan: Regional   Post-op Pain Management:    Induction:   Airway Management Planned: Natural Airway and Nasal Cannula  Additional Equipment:   Intra-op Plan:   Post-operative Plan:   Informed Consent: I have reviewed the patients History and Physical, chart, labs and discussed the procedure including the risks, benefits and alternatives for the proposed anesthesia with the patient or authorized representative who has indicated his/her understanding and acceptance.     Plan Discussed with: CRNA, Anesthesiologist and Surgeon  Anesthesia Plan Comments:         Anesthesia Quick Evaluation

## 2015-10-27 NOTE — Progress Notes (Signed)
Entered in d/c instructions  [facility] on 10/28/2015 You have a scheduled 10/28/15 1030 appt to see the financial counselor [id number] [place] Renato Shin Go on 11/14/2015 you have a 2 month follow up appt with Dr Loanne Drilling at [place] on 11/14/15 [address] [place] [address] [phone]

## 2015-10-28 ENCOUNTER — Ambulatory Visit: Payer: Self-pay | Attending: Internal Medicine

## 2015-10-28 ENCOUNTER — Encounter (HOSPITAL_COMMUNITY): Payer: Self-pay | Admitting: Orthopedic Surgery

## 2015-10-30 LAB — CULTURE, ROUTINE-ABSCESS

## 2015-11-01 LAB — ANAEROBIC CULTURE

## 2015-11-14 ENCOUNTER — Encounter: Payer: Self-pay | Admitting: Endocrinology

## 2015-11-14 ENCOUNTER — Ambulatory Visit (INDEPENDENT_AMBULATORY_CARE_PROVIDER_SITE_OTHER): Payer: Self-pay | Admitting: Endocrinology

## 2015-11-14 VITALS — BP 144/94 | HR 75 | Temp 97.7°F | Ht 64.0 in | Wt 239.0 lb

## 2015-11-14 DIAGNOSIS — E0590 Thyrotoxicosis, unspecified without thyrotoxic crisis or storm: Secondary | ICD-10-CM

## 2015-11-14 LAB — TSH: TSH: 0.19 u[IU]/mL — ABNORMAL LOW (ref 0.35–4.50)

## 2015-11-14 LAB — T4, FREE: FREE T4: 0.73 ng/dL (ref 0.60–1.60)

## 2015-11-14 NOTE — Progress Notes (Signed)
Subjective:    Patient ID: Olivia Schmidt, female    DOB: August 11, 1960, 55 y.o.   MRN: [id number]  HPI Pt returns for f/u of hyperthyroidism (dx'ed late 2016, when she presented with AF, and was rx'ed tapazole; she has never had thyroid imaging).  pt states she continues to feel better in general.  She says she never misses the tapazole.   Past Medical History  Diagnosis Date  . Fibroids   . PAF (paroxysmal atrial fibrillation) ([place]) 05/09/2015  . Atrial fibrillation with rapid ventricular response ([place])   . Thrombocytopenia ([place]) 05/09/2015  . Hypomagnesemia 05/08/2015  . Hypokalemia 05/08/2015  . Hyperthyroidism 05/07/2015  . Goiter   . FIBROIDS, UTERUS 07/11/2009    Qualifier: Diagnosis of  By: Amil Amen MD, Benjamine Mola    . FEMALE INFERTILITY 10/21/2008    Qualifier: Diagnosis of  By: Amil Amen MD, Benjamine Mola    . Chest pain 05/07/2015  . Anemia 05/07/2015  . Acute bronchitis 05/08/2015    Past Surgical History  Procedure Laterality Date  . Abdominal hysterectomy    . I&d extremity Right 10/27/2015    Procedure: IRRIGATION AND DEBRIDEMENT RIGHT SMALL FINGER;  Surgeon: Leanora Cover, MD;  Location: [facility];  Service: Orthopedics;  Laterality: Right;  With local block    Social History   Social History  . Marital Status: Married    Spouse Name: N/A  . Number of Children: N/A  . Years of Education: N/A   Occupational History  . Not on file.   Social History Main Topics  . Smoking status: Former Smoker -- 24 years    Quit date: 06/12/2003  . Smokeless tobacco: Never Used  . Alcohol Use: No  . Drug Use: No  . Sexual Activity: Yes    Birth Control/ Protection: Surgical   Other Topics Concern  . Not on file   Social History Narrative    Current Outpatient Prescriptions on File Prior to Visit  Medication Sig Dispense Refill  . aspirin EC 81 MG EC tablet Take 1 tablet (81 mg total) by mouth daily.    Marland Kitchen aspirin-acetaminophen-caffeine (EXCEDRIN MIGRAINE) 250-250-65 MG per  tablet Take 1-2 tablets by mouth every 6 (six) hours as needed for headache. Reported on 09/01/2015    . cyclobenzaprine (FLEXERIL) 10 MG tablet Take 10 mg by mouth 3 (three) times daily as needed for muscle spasms.    . hydrOXYzine (ATARAX/VISTARIL) 10 MG tablet Take 1 tablet (10 mg total) by mouth 3 (three) times daily as needed. (Patient taking differently: Take 10 mg by mouth 3 (three) times daily as needed for itching. ) 30 tablet 0  . ibuprofen (ADVIL,MOTRIN) 200 MG tablet Take 200 mg by mouth every 6 (six) hours as needed. Reported on 09/01/2015    . methimazole (TAPAZOLE) 10 MG tablet Take 1 tablet (10 mg total) by mouth 2 (two) times daily. 60 tablet 5  . oxyCODONE-acetaminophen (PERCOCET) 10-325 MG tablet 1-2 tabs po q6 hours prn pain 30 tablet 0  . sulfamethoxazole-trimethoprim (BACTRIM DS) 800-160 MG tablet Take 1 tablet by mouth 2 (two) times daily. 14 tablet 0   No current facility-administered medications on file prior to visit.    No Known Allergies  Family History  Problem Relation Age of Onset  . Aneurysm Father     brain  . Hypertension Sister   . Hypertension Brother   . Thyroid disease Neg Hx     BP 144/94 mmHg  Pulse 75  Temp(Src) 97.7 F (36.5 C) (Oral)  Ht 5\' 4"  (1.626 m)  Wt 239 lb (108.41 kg)  BMI 41.00 kg/m2  SpO2 96%  LMP 09/14/2010   Review of Systems Denies fever    Objective:   Physical Exam VITAL SIGNS:  See vs page GENERAL: no distress Neck: thyroid is 5 times normal size, (R>>L) Skin: not diaphoretic Neuro: no tremor      Assessment & Plan:  Hyperthyroidism: due for recheck: She declines RAI, at least for now.   Patient is advised the following: Patient Instructions  blood tests are requested for you today.  We'll let you know about the results.  if ever you have fever while taking methimazole, stop it and call us, even if the reason is obvious, because of the risk of a rare side-effect.  Please come back for a follow-up appointment  in 3 months.    Renato Shin, MD

## 2015-11-14 NOTE — Addendum Note (Signed)
Addended by: Kaylyn Lim I on: 11/14/2015 09:04 AM   Modules accepted: Orders

## 2015-11-14 NOTE — Patient Instructions (Addendum)
blood tests are requested for you today.  We'll let you know about the results. if ever you have fever while taking methimazole, stop it and call us, even if the reason is obvious, because of the risk of a rare side-effect.   Please come back for a follow-up appointment in 3 months.  

## 2015-12-01 ENCOUNTER — Other Ambulatory Visit: Payer: Self-pay | Admitting: Internal Medicine

## 2015-12-05 ENCOUNTER — Encounter (HOSPITAL_COMMUNITY): Payer: Self-pay | Admitting: Emergency Medicine

## 2015-12-05 ENCOUNTER — Emergency Department (HOSPITAL_COMMUNITY)
Admission: EM | Admit: 2015-12-05 | Discharge: 2015-12-05 | Disposition: A | Discharge status: Home / Self Care | Payer: Self-pay | Attending: Emergency Medicine | Admitting: Emergency Medicine

## 2015-12-05 DIAGNOSIS — L720 Epidermal cyst: Secondary | ICD-10-CM

## 2015-12-05 DIAGNOSIS — I4891 Unspecified atrial fibrillation: Secondary | ICD-10-CM | POA: Insufficient documentation

## 2015-12-05 DIAGNOSIS — Z7982 Long term (current) use of aspirin: Secondary | ICD-10-CM | POA: Insufficient documentation

## 2015-12-05 DIAGNOSIS — Z87891 Personal history of nicotine dependence: Secondary | ICD-10-CM | POA: Insufficient documentation

## 2015-12-05 DIAGNOSIS — L723 Sebaceous cyst: Secondary | ICD-10-CM | POA: Insufficient documentation

## 2015-12-05 MED ORDER — HYDROCODONE-ACETAMINOPHEN 5-325 MG PO TABS
1.0000 | ORAL_TABLET | Freq: Once | ORAL | Status: AC
  Administered 2015-12-05: 1 via ORAL
  Filled 2015-12-05: qty 1

## 2015-12-05 MED ORDER — HYDROCODONE-ACETAMINOPHEN 5-325 MG PO TABS: 2.0000 | ORAL_TABLET | ORAL | Status: DC | PRN

## 2015-12-05 MED ORDER — LIDOCAINE-EPINEPHRINE 2 %-1:100000 IJ SOLN
20.0000 mL | Freq: Once | INTRAMUSCULAR | Status: AC
  Administered 2015-12-05: 20 mL via INTRADERMAL
  Filled 2015-12-05: qty 20

## 2015-12-05 NOTE — ED Provider Notes (Signed)
CSN: [id number]     Arrival date & time 12/05/15  1401 History  By signing my name below, I, Soijett Blue, attest that this documentation has been prepared under the direction and in the presence of Recardo Evangelist, PA-C Electronically Signed: [place], ED Scribe. 12/05/2015. 3:25 PM.  Chief Complaint  Patient presents with  . Abscess    The history is provided by the patient. No language interpreter was used.    Olivia Schmidt is a 55 y.o. female who presents to the Emergency Department complaining of a localized swelling to right jaw onset 3 weeks. Pt notes that the area was intially a small bump that has progressively gotten larger. Pt states that she had the area I&D and it initially resolved however she is unable to tell me when this happened specifically. Pt is having associated symptoms of white drainage. She notes that she has not tried warm compresses/soaks or medications for the relief of her symptoms. She denies fever, chills, and any other symptoms.    Per pt chart review: Pt was seen at [facility] with Chari Manning, NP on 03/29/2015 for abscess to right jaw. Pt didnt't have the area I&D, but she was Rx bactrim for her symptoms.    Past Medical History  Diagnosis Date  . Fibroids   . PAF (paroxysmal atrial fibrillation) ([place]) 05/09/2015  . Atrial fibrillation with rapid ventricular response ([place])   . Thrombocytopenia ([place]) 05/09/2015  . Hypomagnesemia 05/08/2015  . Hypokalemia 05/08/2015  . Hyperthyroidism 05/07/2015  . Goiter   . FIBROIDS, UTERUS 07/11/2009    Qualifier: Diagnosis of  By: Amil Amen MD, Benjamine Mola    . FEMALE INFERTILITY 10/21/2008    Qualifier: Diagnosis of  By: Amil Amen MD, Benjamine Mola    . Chest pain 05/07/2015  . Anemia 05/07/2015  . Acute bronchitis 05/08/2015   Past Surgical History  Procedure Laterality Date  . Abdominal hysterectomy    . I&d extremity Right 10/27/2015    Procedure: IRRIGATION AND DEBRIDEMENT RIGHT  SMALL FINGER;  Surgeon: Leanora Cover, MD;  Location: [facility];  Service: Orthopedics;  Laterality: Right;  With local block   Family History  Problem Relation Age of Onset  . Aneurysm Father     brain  . Hypertension Sister   . Hypertension Brother   . Thyroid disease Neg Hx    Social History  Substance Use Topics  . Smoking status: Former Smoker -- 24 years    Quit date: 06/12/2003  . Smokeless tobacco: Never Used  . Alcohol Use: No   OB History    Gravida Para Term Preterm AB TAB SAB Ectopic Multiple Living   1    1          Review of Systems  Constitutional: Negative for fever and chills.  HENT: Negative for trouble swallowing.        Abscess to right jaw with white drainage.     Allergies  Review of patient's allergies indicates no known allergies.  Home Medications   Prior to Admission medications   Medication Sig Start Date End Date Taking? Authorizing Provider  aspirin EC 81 MG EC tablet Take 1 tablet (81 mg total) by mouth daily. 05/11/15   Eugenie Filler, MD  aspirin-acetaminophen-caffeine (EXCEDRIN MIGRAINE) [phone] MG per tablet Take 1-2 tablets by mouth every 6 (six) hours as needed for headache. Reported on 09/01/2015    Historical Provider, MD  cyclobenzaprine (FLEXERIL) 10 MG tablet Take 10 mg by mouth  3 (three) times daily as needed for muscle spasms.    Historical Provider, MD  hydrOXYzine (ATARAX/VISTARIL) 10 MG tablet Take 1 tablet (10 mg total) by mouth 3 (three) times daily as needed. Patient taking differently: Take 10 mg by mouth 3 (three) times daily as needed for itching.  10/24/15   Konrad Felix, PA  ibuprofen (ADVIL,MOTRIN) 200 MG tablet Take 200 mg by mouth every 6 (six) hours as needed. Reported on 09/01/2015    Historical Provider, MD  methimazole (TAPAZOLE) 10 MG tablet Take 1 tablet (10 mg total) by mouth 2 (two) times daily. 09/01/15   Renato Shin, MD  oxyCODONE-acetaminophen (PERCOCET) 10-325 MG tablet 1-2 tabs po q6 hours prn pain  10/27/15   Leanora Cover, MD  sulfamethoxazole-trimethoprim (BACTRIM DS) 800-160 MG tablet Take 1 tablet by mouth 2 (two) times daily. 10/27/15   Leanora Cover, MD   BP 148/99 mmHg  Pulse 72  Temp(Src) 97.7 F (36.5 C) (Oral)  Resp 16  SpO2 100%  LMP 09/14/2010   Physical Exam  Constitutional: She is oriented to person, place, and time. She appears well-developed and well-nourished. No distress.  HENT:  Head: Normocephalic and atraumatic.  Eyes: Conjunctivae are normal. Pupils are equal, round, and reactive to light. Right eye exhibits no discharge. Left eye exhibits no discharge. No scleral icterus.  Neck: Normal range of motion.  Cardiovascular: Normal rate.   Pulmonary/Chest: Effort normal. No respiratory distress.  Abdominal: She exhibits no distension.  Neurological: She is alert and oriented to person, place, and time.  Skin: Skin is warm and dry.  3cm soft tissue swelling with central punctum. Mild erythema and tenderness to palpation. No drainage.  Psychiatric: She has a normal mood and affect. Her behavior is normal.  Nursing note and vitals reviewed.   ED Course  Procedures (including critical care time) DIAGNOSTIC STUDIES: Oxygen Saturation is 100% on RA, nl by my interpretation.    COORDINATION OF CARE: 3:24 PM Discussed treatment plan with pt at bedside which includes I&D and norco and pt agreed to plan.  INCISION AND DRAINAGE PROCEDURE NOTE: Patient identification was confirmed and verbal consent was obtained. This procedure was performed by Janetta Hora, PA-C at 3:50 PM. Site: right jaw Sterile procedures observed: Yes Anesthetic used (type and amt): 2% Lidocaine with epinephrine and 5 ml used Blade size: 15 Drainage: Sebaceous material Complexity: Simple Packing used: no Site anesthetized, elliptical incision made over site, cyst core removed, wound drained and explored. Wound was  rinsed with copious amounts of normal saline and sutured with 6-0 Nylon. Pt  tolerated procedure well without complications.  Instructions for care discussed verbally and pt provided with additional written instructions for homecare and f/u.  LACERATION REPAIR PROCEDURE NOTE The patient's identification was confirmed and consent was obtained. This procedure was performed by Janetta Hora, PA-C at 4:30 PM. Site: right jaw Sterile procedures observed: YES Anesthetic used (type and amt): 2 % Lidocaine with Epinephrine and 5 ml used Suture type/size:6-0 Ethilon  Length: 2 cm # of Sutures: 2 Technique:simple interrupted Complexity: Simple Antibx ointment applied: none   MDM   Final diagnoses:  Epidermal cyst of neck   55 year old female who presents with a cyst which is most likely why she has had a recurrent one in the same place. Cyst removal performed with much of the core removed. No signs of infection therefore site was closed with sutures. Discussed patient might have recurrence of cyst if all core contents were not removed.  Referral to plastics given. Advised suture removal in 5-7 days. The patient is alerted to watch for any signs of infection (redness, pus, pain, increased swelling or fever) and call if such occurs. Home wound care instructions are provided. Patient is complaining of significant pain. Norco rx given and advised to use Ibuprofen as well. Patient is NAD, non-toxic, with stable VS. Patient is informed of clinical course, understands medical decision making process, and agrees with plan. Opportunity for questions provided and all questions answered. Return precautions given.   I personally performed the services described in this documentation, which was scribed in my presence. The recorded information has been reviewed and is accurate.   Recardo Evangelist, PA-C 12/05/15 2012  Charlesetta Shanks, MD 12/10/15 1048

## 2015-12-05 NOTE — Discharge Instructions (Signed)
Epidermal Cyst An epidermal cyst is sometimes called a sebaceous cyst, epidermal inclusion cyst, or infundibular cyst. These cysts usually contain a substance that looks "pasty" or "cheesy" and may have a bad smell. This substance is a protein called keratin. Epidermal cysts are usually found on the face, neck, or trunk. They may also occur in the vaginal area or other parts of the genitalia of both men and women. Epidermal cysts are usually small, painless, slow-growing bumps or lumps that move freely under the skin. It is important not to try to pop them. This may cause an infection and lead to tenderness and swelling. CAUSES  Epidermal cysts may be caused by a deep penetrating injury to the skin or a plugged hair follicle, often associated with acne. SYMPTOMS  Epidermal cysts can become inflamed and cause:  Redness.  Tenderness.  Increased temperature of the skin over the bumps or lumps.  Grayish-white, bad smelling material that drains from the bump or lump. DIAGNOSIS  Epidermal cysts are easily diagnosed by your caregiver during an exam. Rarely, a tissue sample (biopsy) may be taken to rule out other conditions that may resemble epidermal cysts. TREATMENT   Epidermal cysts often get better and disappear on their own. They are rarely ever cancerous.  If a cyst becomes infected, it may become inflamed and tender. This may require opening and draining the cyst. Treatment with antibiotics may be necessary. When the infection is gone, the cyst may be removed with minor surgery.  Small, inflamed cysts can often be treated with antibiotics or by injecting steroid medicines.  Sometimes, epidermal cysts become large and bothersome. If this happens, surgical removal in your caregiver's office may be necessary. HOME CARE INSTRUCTIONS  Only take over-the-counter or prescription medicines as directed by your caregiver.  Take your antibiotics as directed. Finish them even if you start to feel  better. SEEK MEDICAL CARE IF:   Your cyst becomes tender, red, or swollen.  Your condition is not improving or is getting worse.  You have any other questions or concerns. MAKE SURE YOU:  Understand these instructions.  Will watch your condition.  Will get help right away if you are not doing well or get worse.   This information is not intended to replace advice given to you by your health care provider. Make sure you discuss any questions you have with your health care provider.   Document Released: 04/28/2004 Document Revised: 08/20/2011 Document Reviewed: 12/04/2010 Elsevier Interactive Patient Education 2016 Elsevier Inc.  

## 2015-12-05 NOTE — ED Notes (Signed)
Pt c/o abscess under R jaw x "a few weeks." Pt has hx of the same and has had it lanced in the past. Pt A&Ox4 and ambulatory. No other complaints at this time.

## 2015-12-15 ENCOUNTER — Ambulatory Visit: Payer: Self-pay | Admitting: Endocrinology

## 2015-12-16 ENCOUNTER — Ambulatory Visit: Payer: Self-pay | Admitting: Endocrinology

## 2016-01-17 ENCOUNTER — Other Ambulatory Visit: Payer: Self-pay | Admitting: Internal Medicine

## 2016-01-19 ENCOUNTER — Ambulatory Visit: Payer: Self-pay | Attending: Internal Medicine

## 2016-01-27 ENCOUNTER — Ambulatory Visit: Payer: Self-pay | Attending: Internal Medicine

## 2016-02-14 ENCOUNTER — Other Ambulatory Visit (INDEPENDENT_AMBULATORY_CARE_PROVIDER_SITE_OTHER): Payer: Self-pay

## 2016-02-14 ENCOUNTER — Ambulatory Visit (INDEPENDENT_AMBULATORY_CARE_PROVIDER_SITE_OTHER): Payer: Self-pay | Admitting: Endocrinology

## 2016-02-14 ENCOUNTER — Other Ambulatory Visit: Payer: Self-pay | Admitting: Endocrinology

## 2016-02-14 ENCOUNTER — Encounter: Payer: Self-pay | Admitting: Endocrinology

## 2016-02-14 VITALS — BP 128/86 | HR 62 | Ht 64.0 in | Wt 248.0 lb

## 2016-02-14 DIAGNOSIS — E0590 Thyrotoxicosis, unspecified without thyrotoxic crisis or storm: Secondary | ICD-10-CM

## 2016-02-14 DIAGNOSIS — IMO0001 Reserved for inherently not codable concepts without codable children: Secondary | ICD-10-CM

## 2016-02-14 DIAGNOSIS — M609 Myositis, unspecified: Secondary | ICD-10-CM

## 2016-02-14 DIAGNOSIS — M791 Myalgia, unspecified site: Secondary | ICD-10-CM

## 2016-02-14 LAB — T4, FREE: Free T4: 0.88 ng/dL (ref 0.60–1.60)

## 2016-02-14 LAB — SEDIMENTATION RATE: SED RATE: 79 mm/h — AB (ref 0–30)

## 2016-02-14 LAB — TSH: TSH: 0.02 u[IU]/mL — ABNORMAL LOW (ref 0.35–4.50)

## 2016-02-14 LAB — CK: Total CK: 488 U/L — ABNORMAL HIGH (ref 7–177)

## 2016-02-14 NOTE — Progress Notes (Signed)
Subjective:    Patient ID: Olivia Schmidt Minor, female    DOB: [birth year], 55 y.o.   MRN: [id number]  HPI Pt returns for f/u of hyperthyroidism (dx'ed late 2016, when she presented with AF, and was rx'ed tapazole; she has never had thyroid imaging).  She says she never misses the tapazole.   She has moderate myalgias throughout the body, but no assoc fever Past Medical History:  Diagnosis Date  . Acute bronchitis 05/08/2015  . Anemia 05/07/2015  . Atrial fibrillation with rapid ventricular response ([place])   . Chest pain 05/07/2015  . FEMALE INFERTILITY 10/21/2008   Qualifier: Diagnosis of  By: Amil Amen MD, Benjamine Mola    . Fibroids   . FIBROIDS, UTERUS 07/11/2009   Qualifier: Diagnosis of  By: Amil Amen MD, Benjamine Mola    . Goiter   . Hyperthyroidism 05/07/2015  . Hypokalemia 05/08/2015  . Hypomagnesemia 05/08/2015  . PAF (paroxysmal atrial fibrillation) ([place]) 05/09/2015  . Thrombocytopenia ([place]) 05/09/2015    Past Surgical History:  Procedure Laterality Date  . ABDOMINAL HYSTERECTOMY    . I&D EXTREMITY Right 10/27/2015   Procedure: IRRIGATION AND DEBRIDEMENT RIGHT SMALL FINGER;  Surgeon: Leanora Cover, MD;  Location: [facility];  Service: Orthopedics;  Laterality: Right;  With local block    Social History   Social History  . Marital status: Married    Spouse name: N/A  . Number of children: N/A  . Years of education: N/A   Occupational History  . Not on file.   Social History Main Topics  . Smoking status: Former Smoker    Years: 24.00    Quit date: 06/12/2003  . Smokeless tobacco: Never Used  . Alcohol use No  . Drug use: No  . Sexual activity: Yes    Birth control/ protection: Surgical   Other Topics Concern  . Not on file   Social History Narrative  . No narrative on file    Current Outpatient Prescriptions on File Prior to Visit  Medication Sig Dispense Refill  . aspirin EC 81 MG EC tablet Take 1 tablet (81 mg total) by mouth daily.    Marland Kitchen  aspirin-acetaminophen-caffeine (EXCEDRIN MIGRAINE) 250-250-65 MG per tablet Take 1-2 tablets by mouth every 6 (six) hours as needed for headache. Reported on 09/01/2015    . cyclobenzaprine (FLEXERIL) 10 MG tablet Take 10 mg by mouth 3 (three) times daily as needed for muscle spasms.    Marland Kitchen HYDROcodone-acetaminophen (NORCO/VICODIN) 5-325 MG tablet Take 2 tablets by mouth every 4 (four) hours as needed. 6 tablet 0  . hydrOXYzine (ATARAX/VISTARIL) 10 MG tablet Take 1 tablet (10 mg total) by mouth 3 (three) times daily as needed. (Patient taking differently: Take 10 mg by mouth 3 (three) times daily as needed for itching. ) 30 tablet 0  . ibuprofen (ADVIL,MOTRIN) 200 MG tablet Take 200 mg by mouth every 6 (six) hours as needed. Reported on 09/01/2015    . oxyCODONE-acetaminophen (PERCOCET) 10-325 MG tablet 1-2 tabs po q6 hours prn pain 30 tablet 0  . sulfamethoxazole-trimethoprim (BACTRIM DS) 800-160 MG tablet Take 1 tablet by mouth 2 (two) times daily. 14 tablet 0   No current facility-administered medications on file prior to visit.     No Known Allergies  Family History  Problem Relation Age of Onset  . Aneurysm Father     brain  . Hypertension Sister   . Hypertension Brother   . Thyroid disease Neg Hx     BP 128/86   Pulse 62  Ht 5\' 4"  (1.626 m)   Wt 248 lb (112.5 kg)   LMP 09/14/2010   BMI 42.57 kg/m    Review of Systems She has gained weight.  No numbness.    Objective:   Physical Exam VITAL SIGNS:  See vs page.  GENERAL: no distress. MSK: no muscle numbness.         Assessment & Plan:  Myalgias, new, uncertain etiology Hyperthyroidism: as sxs are perceived due to tapazole, we should consider RAI instead.  We discussed.  Pt agrees AF: in this context, she needs prompt f/u after RAI, to see if thionamide rx needs to be started while RAI is working

## 2016-02-14 NOTE — Patient Instructions (Addendum)
blood tests are requested for you today.  We'll let you know about the results.   Please stop taking the methimazole.   let's check a thyroid "scan" (a special, but easy and painless type of thyroid x ray).  It works like this: you go to the x-ray department of the hospital to swallow a pill, which contains a miniscule amount of radiation.  You will not notice any symptoms from this.  You will go back to the x-ray department the next day, to lie down in front of a camera.  The results of this will be sent to me.   Based on the results, i hope to order for you a treatment pill of radioactive iodine.  Although it is a larger amount of radiation, you will again notice no symptoms from this.  The pill is gone from your body in a few days (during which you should stay away from other people), but takes several months to work.   please return here approximately 1 week after the treatment.   This treatment has been available for many years, and the only known side-effect is an underactive thyroid.  It is possible that i would eventually prescribe for you a thyroid hormone pill, which is very inexpensive.  You don't have to worry about side-effects of this thyroid hormone pill, because it is the same molecule your thyroid makes.

## 2016-02-15 ENCOUNTER — Other Ambulatory Visit: Payer: Self-pay | Admitting: Endocrinology

## 2016-02-15 DIAGNOSIS — E0590 Thyrotoxicosis, unspecified without thyrotoxic crisis or storm: Secondary | ICD-10-CM

## 2016-02-22 ENCOUNTER — Encounter (HOSPITAL_COMMUNITY)
Admission: RE | Admit: 2016-02-22 | Discharge: 2016-02-22 | Disposition: A | Discharge status: Home / Self Care | Payer: Self-pay | Source: Ambulatory Visit | Attending: Endocrinology | Admitting: Endocrinology

## 2016-02-22 ENCOUNTER — Encounter: Payer: Self-pay | Admitting: Endocrinology

## 2016-02-22 DIAGNOSIS — E0590 Thyrotoxicosis, unspecified without thyrotoxic crisis or storm: Secondary | ICD-10-CM | POA: Insufficient documentation

## 2016-02-22 MED ORDER — SODIUM IODIDE I 131 CAPSULE
15.6000 | Freq: Once | INTRAVENOUS | Status: AC | PRN
  Administered 2016-02-22: 15.6 via ORAL

## 2016-02-23 ENCOUNTER — Encounter (HOSPITAL_COMMUNITY)
Admission: RE | Admit: 2016-02-23 | Discharge: 2016-02-23 | Disposition: A | Discharge status: Home / Self Care | Payer: Self-pay | Source: Ambulatory Visit | Attending: Endocrinology | Admitting: Endocrinology

## 2016-02-23 ENCOUNTER — Other Ambulatory Visit: Payer: Self-pay | Admitting: Endocrinology

## 2016-02-23 DIAGNOSIS — E0590 Thyrotoxicosis, unspecified without thyrotoxic crisis or storm: Secondary | ICD-10-CM

## 2016-02-23 MED ORDER — SODIUM PERTECHNETATE TC 99M INJECTION: 10.0000 | Freq: Once | INTRAVENOUS | Status: DC | PRN

## 2016-03-09 ENCOUNTER — Encounter (HOSPITAL_COMMUNITY)
Admission: RE | Admit: 2016-03-09 | Discharge: 2016-03-09 | Disposition: A | Discharge status: Home / Self Care | Payer: Self-pay | Source: Ambulatory Visit | Attending: Endocrinology | Admitting: Endocrinology

## 2016-03-09 DIAGNOSIS — E0590 Thyrotoxicosis, unspecified without thyrotoxic crisis or storm: Secondary | ICD-10-CM | POA: Insufficient documentation

## 2016-03-09 MED ORDER — SODIUM IODIDE I 131 CAPSULE
16.6200 | Freq: Once | INTRAVENOUS | Status: AC | PRN
  Administered 2016-03-09: 16.62 via ORAL

## 2016-03-19 ENCOUNTER — Ambulatory Visit (INDEPENDENT_AMBULATORY_CARE_PROVIDER_SITE_OTHER): Payer: Self-pay | Admitting: Endocrinology

## 2016-03-19 ENCOUNTER — Encounter: Payer: Self-pay | Admitting: Endocrinology

## 2016-03-19 VITALS — BP 138/82 | HR 77 | Wt 254.0 lb

## 2016-03-19 DIAGNOSIS — E0590 Thyrotoxicosis, unspecified without thyrotoxic crisis or storm: Secondary | ICD-10-CM

## 2016-03-19 MED ORDER — PROPYLTHIOURACIL 50 MG PO TABS: 100.0000 mg | ORAL_TABLET | Freq: Two times a day (BID) | ORAL | 1 refills | Status: DC

## 2016-03-19 NOTE — Progress Notes (Signed)
Subjective:    Patient ID: Olivia Schmidt, female    DOB: [birth year], 55 y.o.   MRN: [id number]  HPI  The state of at least three ongoing medical problems is addressed today, with interval history of each noted here:  Pt returns for f/u of hyperthyroidism (dx'ed late 2016, when she presented with AF, and was rx'ed tapazole).  She had RAI 9 days ago.  She has gained a few lbs Myalgias: pt says resolved off tapazole. AF: Denies palpitations Past Medical History:  Diagnosis Date  . Acute bronchitis 05/08/2015  . Anemia 05/07/2015  . Atrial fibrillation with rapid ventricular response ([place])   . Chest pain 05/07/2015  . FEMALE INFERTILITY 10/21/2008   Qualifier: Diagnosis of  By: Amil Amen MD, Benjamine Mola    . Fibroids   . FIBROIDS, UTERUS 07/11/2009   Qualifier: Diagnosis of  By: Amil Amen MD, Benjamine Mola    . Goiter   . Hyperthyroidism 05/07/2015  . Hypokalemia 05/08/2015  . Hypomagnesemia 05/08/2015  . PAF (paroxysmal atrial fibrillation) ([place]) 05/09/2015  . Thrombocytopenia ([place]) 05/09/2015    Past Surgical History:  Procedure Laterality Date  . ABDOMINAL HYSTERECTOMY    . I&D EXTREMITY Right 10/27/2015   Procedure: IRRIGATION AND DEBRIDEMENT RIGHT SMALL FINGER;  Surgeon: Leanora Cover, MD;  Location: [facility];  Service: Orthopedics;  Laterality: Right;  With local block    Social History   Social History  . Marital status: Married    Spouse name: N/A  . Number of children: N/A  . Years of education: N/A   Occupational History  . Not on file.   Social History Main Topics  . Smoking status: Former Smoker    Years: 24.00    Quit date: 06/12/2003  . Smokeless tobacco: Never Used  . Alcohol use No  . Drug use: No  . Sexual activity: Yes    Birth control/ protection: Surgical   Other Topics Concern  . Not on file   Social History Narrative  . No narrative on file    Current Outpatient Prescriptions on File Prior to Visit  Medication Sig Dispense Refill  . aspirin EC 81  MG EC tablet Take 1 tablet (81 mg total) by mouth daily.     No current facility-administered medications on file prior to visit.     No Known Allergies  Family History  Problem Relation Age of Onset  . Aneurysm Father     brain  . Hypertension Sister   . Hypertension Brother   . Thyroid disease Neg Hx     BP 138/82 (BP Location: Left Arm, Patient Position: Sitting)   Pulse 77   Wt 254 lb (115.2 kg)   LMP 09/14/2010   SpO2 97%   BMI 43.60 kg/m     Review of Systems Denies fever and sob    Objective:   Physical Exam VITAL SIGNS:  See vs page GENERAL: no distress Neck: thyroid is 3-5 normal size, (R>L) Skin: not diaphoretic Neuro: no tremor     Assessment & Plan:  AF: in this context, she needs to be on rx while RAI is working Myalgias: possibly due to tapazole Hyperthyroidism: s/p RAI, which will take time to work.  Patient is advised the following:  Patient Instructions  I have sent a prescription to your pharmacy, for an alternative to the methimazole. If ever you have fever while taking this medication, stop it and call us, even if the reason is obvious, because of the risk of a rare side-effect.  Please come back for a follow-up appointment in 3 weeks.

## 2016-03-19 NOTE — Patient Instructions (Addendum)
I have sent a prescription to your pharmacy, for an alternative to the methimazole. If ever you have fever while taking this medication, stop it and call us, even if the reason is obvious, because of the risk of a rare side-effect.  Please come back for a follow-up appointment in 3 weeks.

## 2016-03-20 ENCOUNTER — Telehealth: Payer: Self-pay | Admitting: Endocrinology

## 2016-03-20 MED ORDER — PROPYLTHIOURACIL 50 MG PO TABS
100.0000 mg | ORAL_TABLET | Freq: Two times a day (BID) | ORAL | 1 refills | Status: DC
Start: 2016-03-20 — End: 2016-04-09

## 2016-03-20 NOTE — Telephone Encounter (Signed)
Pt called and said that Propylthiouracil is too expensive at [facility], she wants to know if we can resubmit to the [facility].

## 2016-03-20 NOTE — Telephone Encounter (Signed)
Medication refill submitted to the [facility].

## 2016-04-09 ENCOUNTER — Ambulatory Visit (INDEPENDENT_AMBULATORY_CARE_PROVIDER_SITE_OTHER): Payer: Self-pay | Admitting: Endocrinology

## 2016-04-09 ENCOUNTER — Encounter: Payer: Self-pay | Admitting: Endocrinology

## 2016-04-09 VITALS — BP 132/80 | HR 69 | Ht 64.0 in | Wt 257.0 lb

## 2016-04-09 DIAGNOSIS — E0590 Thyrotoxicosis, unspecified without thyrotoxic crisis or storm: Secondary | ICD-10-CM

## 2016-04-09 LAB — TSH: TSH: 0.03 u[IU]/mL — AB (ref 0.35–4.50)

## 2016-04-09 LAB — T4, FREE: FREE T4: 0.8 ng/dL (ref 0.60–1.60)

## 2016-04-09 MED ORDER — PROPYLTHIOURACIL 50 MG PO TABS
200.0000 mg | ORAL_TABLET | Freq: Two times a day (BID) | ORAL | 1 refills | Status: DC
Start: 2016-04-09 — End: 2016-05-23

## 2016-04-09 NOTE — Progress Notes (Signed)
   Subjective:    Patient ID: Olivia Schmidt Minor, female    DOB: 1960/06/27, 55 y.o.   MRN: [id number]  HPI Pt returns for f/u of hyperthyroidism (due to [place]; dx'ed late 2016, when she presented with AF, and was rx'ed tapazole).  She has gained a few lbs.  She had RAI 1 month ago, then went back on the PTU.   Past Medical History:  Diagnosis Date  . Acute bronchitis 05/08/2015  . Anemia 05/07/2015  . Atrial fibrillation with rapid ventricular response ([place])   . Chest pain 05/07/2015  . FEMALE INFERTILITY 10/21/2008   Qualifier: Diagnosis of  By: Amil Amen MD, Benjamine Mola    . Fibroids   . FIBROIDS, UTERUS 07/11/2009   Qualifier: Diagnosis of  By: Amil Amen MD, Benjamine Mola    . Goiter   . Hyperthyroidism 05/07/2015  . Hypokalemia 05/08/2015  . Hypomagnesemia 05/08/2015  . PAF (paroxysmal atrial fibrillation) ([place]) 05/09/2015  . Thrombocytopenia ([place]) 05/09/2015    Past Surgical History:  Procedure Laterality Date  . ABDOMINAL HYSTERECTOMY    . I&D EXTREMITY Right 10/27/2015   Procedure: IRRIGATION AND DEBRIDEMENT RIGHT SMALL FINGER;  Surgeon: Leanora Cover, MD;  Location: [facility];  Service: Orthopedics;  Laterality: Right;  With local block    Social History   Social History  . Marital status: Married    Spouse name: N/A  . Number of children: N/A  . Years of education: N/A   Occupational History  . Not on file.   Social History Main Topics  . Smoking status: Former Smoker    Years: 24.00    Quit date: 06/12/2003  . Smokeless tobacco: Never Used  . Alcohol use No  . Drug use: No  . Sexual activity: Yes    Birth control/ protection: Surgical   Other Topics Concern  . Not on file   Social History Narrative  . No narrative on file    Current Outpatient Prescriptions on File Prior to Visit  Medication Sig Dispense Refill  . aspirin EC 81 MG EC tablet Take 1 tablet (81 mg total) by mouth daily.     No current facility-administered medications on file prior to visit.       No Known Allergies  Family History  Problem Relation Age of Onset  . Aneurysm Father     brain  . Hypertension Sister   . Hypertension Brother   . Thyroid disease Neg Hx     BP 132/80   Pulse 69   Ht 5\' 4"  (1.626 m)   Wt 257 lb (116.6 kg)   LMP 09/14/2010   SpO2 94%   BMI 44.11 kg/m   Review of Systems Denies fever    Objective:   Physical Exam VITAL SIGNS:  See vs page GENERAL: no distress Neck: thyroid is 3-5 normal size, (R>L) Skin: not diaphoretic Neuro: no tremor  Lab Results  Component Value Date   TSH 0.03 (L) 04/09/2016   T3TOTAL 451 (H) 05/07/2015      Assessment & Plan:  Hyperthyroidism: she needs increased rx.  Increase tapazole to 40-BID. Please come back for a follow-up appointment in 1 month.

## 2016-04-09 NOTE — Patient Instructions (Addendum)
blood tests are requested for you today.  We'll let you know about the results. If ever you have fever while taking this medication, stop it and call us, even if the reason is obvious, because of the risk of a rare side-effect.  Please come back for a follow-up appointment in 1 month.

## 2016-04-30 ENCOUNTER — Ambulatory Visit: Payer: Self-pay

## 2016-05-10 ENCOUNTER — Ambulatory Visit: Payer: Self-pay | Admitting: Endocrinology

## 2016-05-15 ENCOUNTER — Ambulatory Visit: Payer: Self-pay | Attending: Internal Medicine

## 2016-05-20 NOTE — Progress Notes (Signed)
   Subjective:    Patient ID: Olivia Schmidt, female    DOB: 1961/02/24, 55 y.o.   MRN: [id number]  HPI Pt returns for f/u of hyperthyroidism (due to [place]; dx'ed late 2016, when she presented with AF, and was rx'ed tapazole; she had RAI in Sept of 2017, then went on PTU, as pt says tapazole caused myalgias).  pt states she feels well in general, except for weight gain.   Past Medical History:  Diagnosis Date  . Acute bronchitis 05/08/2015  . Anemia 05/07/2015  . Atrial fibrillation with rapid ventricular response ([place])   . Chest pain 05/07/2015  . FEMALE INFERTILITY 10/21/2008   Qualifier: Diagnosis of  By: Amil Amen MD, Benjamine Mola    . Fibroids   . FIBROIDS, UTERUS 07/11/2009   Qualifier: Diagnosis of  By: Amil Amen MD, Benjamine Mola    . Goiter   . Hyperthyroidism 05/07/2015  . Hypokalemia 05/08/2015  . Hypomagnesemia 05/08/2015  . PAF (paroxysmal atrial fibrillation) ([place]) 05/09/2015  . Thrombocytopenia ([place]) 05/09/2015    Past Surgical History:  Procedure Laterality Date  . ABDOMINAL HYSTERECTOMY    . I&D EXTREMITY Right 10/27/2015   Procedure: IRRIGATION AND DEBRIDEMENT RIGHT SMALL FINGER;  Surgeon: Leanora Cover, MD;  Location: [facility];  Service: Orthopedics;  Laterality: Right;  With local block    Social History   Social History  . Marital status: Married    Spouse name: N/A  . Number of children: N/A  . Years of education: N/A   Occupational History  . Not on file.   Social History Main Topics  . Smoking status: Former Smoker    Years: 24.00    Quit date: 06/12/2003  . Smokeless tobacco: Never Used  . Alcohol use No  . Drug use: No  . Sexual activity: Yes    Birth control/ protection: Surgical   Other Topics Concern  . Not on file   Social History Narrative  . No narrative on file    Current Outpatient Prescriptions on File Prior to Visit  Medication Sig Dispense Refill  . aspirin EC 81 MG EC tablet Take 1 tablet (81 mg total) by mouth daily.     No  current facility-administered medications on file prior to visit.     No Known Allergies  Family History  Problem Relation Age of Onset  . Aneurysm Father     brain  . Hypertension Sister   . Hypertension Brother   . Thyroid disease Neg Hx     BP 126/70   Pulse 62   Ht 5\' 4"  (1.626 m)   Wt 260 lb (117.9 kg)   LMP 09/14/2010   SpO2 98%   BMI 44.63 kg/m    Review of Systems Denies fever.     Objective:   Physical Exam VITAL SIGNS:  See vs page GENERAL: no distress Neck: thyroid is approx twice normal size, (R>L).   Skin: not diaphoretic Neuro: no tremor.  Lab Results  Component Value Date   TSH 2.77 05/23/2016   T3TOTAL 451 (H) 05/07/2015       Assessment & Plan:  Hyperthyroidism, much better D/c methimazole. Please come back for a follow-up appointment in 1 month.

## 2016-05-23 ENCOUNTER — Encounter: Payer: Self-pay | Admitting: Endocrinology

## 2016-05-23 ENCOUNTER — Ambulatory Visit (INDEPENDENT_AMBULATORY_CARE_PROVIDER_SITE_OTHER): Payer: Self-pay | Admitting: Endocrinology

## 2016-05-23 VITALS — BP 126/70 | HR 62 | Ht 64.0 in | Wt 260.0 lb

## 2016-05-23 DIAGNOSIS — E0590 Thyrotoxicosis, unspecified without thyrotoxic crisis or storm: Secondary | ICD-10-CM

## 2016-05-23 LAB — TSH: TSH: 2.77 u[IU]/mL (ref 0.35–4.50)

## 2016-05-23 LAB — T4, FREE: FREE T4: 0.69 ng/dL (ref 0.60–1.60)

## 2016-05-23 NOTE — Patient Instructions (Addendum)
blood tests are requested for you today.  We'll let you know about the results. If it is better, this is probably from the iodine pill,as well as the methimazole.   If ever you have fever while taking this medication, stop it and call us, even if the reason is obvious, because of the risk of a rare side-effect.  Please come back for a follow-up appointment in 1 month.

## 2016-06-27 ENCOUNTER — Ambulatory Visit: Payer: Self-pay | Admitting: Endocrinology

## 2016-07-18 ENCOUNTER — Ambulatory Visit (INDEPENDENT_AMBULATORY_CARE_PROVIDER_SITE_OTHER): Payer: Self-pay | Admitting: Endocrinology

## 2016-07-18 ENCOUNTER — Encounter: Payer: Self-pay | Admitting: Endocrinology

## 2016-07-18 ENCOUNTER — Ambulatory Visit: Payer: Self-pay | Attending: Internal Medicine

## 2016-07-18 VITALS — BP 122/84 | HR 59 | Ht 64.0 in | Wt 266.0 lb

## 2016-07-18 DIAGNOSIS — E890 Postprocedural hypothyroidism: Secondary | ICD-10-CM

## 2016-07-18 DIAGNOSIS — E0590 Thyrotoxicosis, unspecified without thyrotoxic crisis or storm: Secondary | ICD-10-CM

## 2016-07-18 DIAGNOSIS — Z0000 Encounter for general adult medical examination without abnormal findings: Secondary | ICD-10-CM

## 2016-07-18 LAB — T4, FREE: FREE T4: 0.56 ng/dL — AB (ref 0.60–1.60)

## 2016-07-18 LAB — TSH: TSH: 7.47 u[IU]/mL — AB (ref 0.35–4.50)

## 2016-07-18 MED ORDER — LEVOTHYROXINE SODIUM 75 MCG PO TABS: 75.0000 ug | ORAL_TABLET | Freq: Every day | ORAL | 3 refills | Status: DC

## 2016-07-18 NOTE — Progress Notes (Signed)
   Subjective:    Patient ID: Olivia Schmidt, female    DOB: 1960/06/26, 55 y.o.   MRN: [id number]  HPI Pt returns for f/u of hyperthyroidism (due to [place]; dx'ed late 2016, when she presented with AF, and was rx'ed tapazole; she had RAI in Sept of 2017).  pt states she feels well in general, except for weight gain.  Past Medical History:  Diagnosis Date  . Acute bronchitis 05/08/2015  . Anemia 05/07/2015  . Atrial fibrillation with rapid ventricular response ([place])   . Chest pain 05/07/2015  . FEMALE INFERTILITY 10/21/2008   Qualifier: Diagnosis of  By: Amil Amen MD, Benjamine Mola    . Fibroids   . FIBROIDS, UTERUS 07/11/2009   Qualifier: Diagnosis of  By: Amil Amen MD, Benjamine Mola    . Goiter   . Hyperthyroidism 05/07/2015  . Hypokalemia 05/08/2015  . Hypomagnesemia 05/08/2015  . PAF (paroxysmal atrial fibrillation) ([place]) 05/09/2015  . Thrombocytopenia ([place]) 05/09/2015    Past Surgical History:  Procedure Laterality Date  . ABDOMINAL HYSTERECTOMY    . I&D EXTREMITY Right 10/27/2015   Procedure: IRRIGATION AND DEBRIDEMENT RIGHT SMALL FINGER;  Surgeon: Leanora Cover, MD;  Location: [facility];  Service: Orthopedics;  Laterality: Right;  With local block    Social History   Social History  . Marital status: Married    Spouse name: N/A  . Number of children: N/A  . Years of education: N/A   Occupational History  . Not on file.   Social History Main Topics  . Smoking status: Former Smoker    Years: 24.00    Quit date: 06/12/2003  . Smokeless tobacco: Never Used  . Alcohol use No  . Drug use: No  . Sexual activity: Yes    Birth control/ protection: Surgical   Other Topics Concern  . Not on file   Social History Narrative  . No narrative on file    Current Outpatient Prescriptions on File Prior to Visit  Medication Sig Dispense Refill  . aspirin EC 81 MG EC tablet Take 1 tablet (81 mg total) by mouth daily.     No current facility-administered medications on file prior to  visit.     No Known Allergies  Family History  Problem Relation Age of Onset  . Aneurysm Father     brain  . Hypertension Sister   . Hypertension Brother   . Thyroid disease Neg Hx     BP 122/84   Pulse (!) 59   Ht 5\' 4"  (1.626 m)   Wt 266 lb (120.7 kg)   LMP 09/14/2010   SpO2 97%   BMI 45.66 kg/m    Review of Systems Denies leg edema.     Objective:   Physical Exam VITAL SIGNS:  See vs page GENERAL: no distress Neck: thyroid is approx twice normal size, diffuse.   Skin: not diaphoretic Neuro: no tremor.   Lab Results  Component Value Date   TSH 7.47 (H) 07/18/2016   T3TOTAL 451 (H) 05/07/2015      Assessment & Plan:  Post-RAI hypothyroidism, new.  I have sent a prescription to your pharmacy, for synthroid.  Recheck blood test in 1 month.

## 2016-07-18 NOTE — Progress Notes (Signed)
Pt is here today to get a TB test done. Pt tb test was done in left arm.

## 2016-07-18 NOTE — Patient Instructions (Signed)
Pt is to return on 07/20/16 @10 :20 to get reading of TB test

## 2016-07-18 NOTE — Patient Instructions (Addendum)
blood tests are requested for you today.  We'll let you know about the results.   Please come back for a follow-up appointment in 3 months.   

## 2016-07-20 ENCOUNTER — Ambulatory Visit: Payer: Self-pay | Attending: Internal Medicine | Admitting: *Deleted

## 2016-07-20 DIAGNOSIS — Z0000 Encounter for general adult medical examination without abnormal findings: Secondary | ICD-10-CM

## 2016-07-20 DIAGNOSIS — Z111 Encounter for screening for respiratory tuberculosis: Secondary | ICD-10-CM | POA: Insufficient documentation

## 2016-07-20 LAB — TB SKIN TEST: TB SKIN TEST: NEGATIVE

## 2016-07-20 NOTE — Progress Notes (Signed)
   Pt received PPD on 07/17/2016 by A. Magdalen Spatz. Pt returned to office today to have PPD read.  PPD Reading Note PPD read and results entered in [place]. Result: no mm induration. Interpretation: negative  Allergic reaction:no  Pt rece

## 2016-08-01 ENCOUNTER — Telehealth: Payer: Self-pay | Admitting: Internal Medicine

## 2016-08-01 NOTE — Telephone Encounter (Signed)
Olivia Schmidt from [facility] called the office to speak with nurse to clarify the date of patients tb test. It was written with the wrong year. Olivia Schmidt would like for a document to be faxed with correct date. Please fax it to [phone].  Thank you.

## 2016-08-02 ENCOUNTER — Encounter: Payer: Self-pay | Admitting: *Deleted

## 2016-08-02 NOTE — Telephone Encounter (Signed)
Document has been faxed. 

## 2016-08-03 ENCOUNTER — Encounter: Payer: Self-pay | Admitting: Gastroenterology

## 2016-08-03 ENCOUNTER — Ambulatory Visit: Payer: Self-pay | Attending: Family Medicine | Admitting: Family Medicine

## 2016-08-03 ENCOUNTER — Encounter: Payer: Self-pay | Admitting: Family Medicine

## 2016-08-03 VITALS — BP 142/81 | HR 66 | Temp 97.5°F | Ht 64.0 in | Wt 269.0 lb

## 2016-08-03 DIAGNOSIS — Z13228 Encounter for screening for other metabolic disorders: Secondary | ICD-10-CM

## 2016-08-03 DIAGNOSIS — Z1239 Encounter for other screening for malignant neoplasm of breast: Secondary | ICD-10-CM

## 2016-08-03 DIAGNOSIS — Z1231 Encounter for screening mammogram for malignant neoplasm of breast: Secondary | ICD-10-CM

## 2016-08-03 DIAGNOSIS — Z7982 Long term (current) use of aspirin: Secondary | ICD-10-CM | POA: Insufficient documentation

## 2016-08-03 DIAGNOSIS — Z1211 Encounter for screening for malignant neoplasm of colon: Secondary | ICD-10-CM

## 2016-08-03 DIAGNOSIS — B373 Candidiasis of vulva and vagina: Secondary | ICD-10-CM | POA: Insufficient documentation

## 2016-08-03 DIAGNOSIS — M62838 Other muscle spasm: Secondary | ICD-10-CM | POA: Insufficient documentation

## 2016-08-03 DIAGNOSIS — B3731 Acute candidiasis of vulva and vagina: Secondary | ICD-10-CM

## 2016-08-03 DIAGNOSIS — R52 Pain, unspecified: Secondary | ICD-10-CM | POA: Insufficient documentation

## 2016-08-03 DIAGNOSIS — Z90710 Acquired absence of both cervix and uterus: Secondary | ICD-10-CM | POA: Insufficient documentation

## 2016-08-03 DIAGNOSIS — Z0000 Encounter for general adult medical examination without abnormal findings: Secondary | ICD-10-CM | POA: Insufficient documentation

## 2016-08-03 DIAGNOSIS — Z79899 Other long term (current) drug therapy: Secondary | ICD-10-CM | POA: Insufficient documentation

## 2016-08-03 DIAGNOSIS — Z1159 Encounter for screening for other viral diseases: Secondary | ICD-10-CM

## 2016-08-03 DIAGNOSIS — E0590 Thyrotoxicosis, unspecified without thyrotoxic crisis or storm: Secondary | ICD-10-CM | POA: Insufficient documentation

## 2016-08-03 MED ORDER — FLUCONAZOLE 150 MG PO TABS: 150.0000 mg | ORAL_TABLET | Freq: Once | ORAL | 0 refills | Status: AC

## 2016-08-03 MED ORDER — CYCLOBENZAPRINE HCL 10 MG PO TABS: 10.0000 mg | ORAL_TABLET | Freq: Two times a day (BID) | ORAL | 1 refills | Status: DC | PRN

## 2016-08-03 NOTE — Progress Notes (Signed)
Subjective:  Patient ID: Olivia Schmidt, female    DOB: 1960-12-21  Age: 56 y.o. MRN: [id number]  CC: Annual Exam (pap today); Hyperthyroidism; and Generalized Body Aches   HPI Olivia Schmidt presents for A complete physical exam. She is requesting a refill of her muscle relaxant which she previously received from her PCP for muscle spasms.  Past Medical History:  Diagnosis Date  . Acute bronchitis 05/08/2015  . Anemia 05/07/2015  . Atrial fibrillation with rapid ventricular response ([place])   . Chest pain 05/07/2015  . FEMALE INFERTILITY 10/21/2008   Qualifier: Diagnosis of  By: Amil Amen MD, Benjamine Mola    . Fibroids   . FIBROIDS, UTERUS 07/11/2009   Qualifier: Diagnosis of  By: Amil Amen MD, Benjamine Mola    . Goiter   . Hyperthyroidism 05/07/2015  . Hypokalemia 05/08/2015  . Hypomagnesemia 05/08/2015  . PAF (paroxysmal atrial fibrillation) ([place]) 05/09/2015  . Thrombocytopenia ([place]) 05/09/2015    Past Surgical History:  Procedure Laterality Date  . ABDOMINAL HYSTERECTOMY    . I&D EXTREMITY Right 10/27/2015   Procedure: IRRIGATION AND DEBRIDEMENT RIGHT SMALL FINGER;  Surgeon: Leanora Cover, MD;  Location: [facility];  Service: Orthopedics;  Laterality: Right;  With local block    No Known Allergies   Outpatient Medications Prior to Visit  Medication Sig Dispense Refill  . aspirin EC 81 MG EC tablet Take 1 tablet (81 mg total) by mouth daily.    Marland Kitchen levothyroxine (SYNTHROID, LEVOTHROID) 75 MCG tablet Take 1 tablet (75 mcg total) by mouth daily before breakfast. 30 tablet 3   No facility-administered medications prior to visit.     ROS Review of Systems  Constitutional: Negative for activity change, appetite change and fatigue.  HENT: Negative for congestion, sinus pressure and sore throat.   Eyes: Negative for visual disturbance.  Respiratory: Negative for cough, chest tightness, shortness of breath and wheezing.   Cardiovascular: Negative for chest pain and palpitations.    Gastrointestinal: Negative for abdominal distention, abdominal pain and constipation.  Endocrine: Negative for polydipsia.  Genitourinary: Negative for dysuria and frequency.  Musculoskeletal: Negative for arthralgias and back pain.  Skin: Negative for rash.  Neurological: Negative for tremors, light-headedness and numbness.  Hematological: Does not bruise/bleed easily.  Psychiatric/Behavioral: Negative for agitation and behavioral problems.    Objective:  BP (!) 142/81 (BP Location: Right Arm, Patient Position: Sitting, Cuff Size: Large)   Pulse 66   Temp 97.5 F (36.4 C) (Oral)   Ht 5\' 4"  (1.626 m)   Wt 269 lb (122 kg)   LMP 09/14/2010   SpO2 100%   BMI 46.17 kg/m   BP/Weight 08/03/2016 07/18/2016 [id number]  Systolic BP A999333 [id number]  Diastolic BP 81 84 70  Wt. (Lbs) 269 266 260  BMI 46.17 45.66 44.63      Physical Exam  Constitutional: She is oriented to person, place, and time. She appears well-developed and well-nourished. No distress.  HENT:  Head: Normocephalic.  Right Ear: External ear normal.  Left Ear: External ear normal.  Nose: Nose normal.  Mouth/Throat: Oropharynx is clear and moist.  Eyes: Conjunctivae and EOM are normal. Pupils are equal, round, and reactive to light.  Neck: Normal range of motion. No JVD present.  Cardiovascular: Normal rate, regular rhythm, normal heart sounds and intact distal pulses.  Exam reveals no gallop.   No murmur heard. Pulmonary/Chest: Effort normal and breath sounds normal. No respiratory distress. She has no wheezes. She has no rales. She exhibits no tenderness. Right  breast exhibits no mass and no tenderness. Left breast exhibits no mass and no tenderness.  Abdominal: Soft. Bowel sounds are normal. She exhibits no distension and no mass. There is no tenderness.  Genitourinary:  Genitourinary Comments: Normal external genitalia, whitish cheesy discharge in vagina, cervix is absent  Musculoskeletal: Normal range of motion. She  exhibits no edema or tenderness.  Neurological: She is alert and oriented to person, place, and time. She has normal reflexes.  Skin: Skin is warm and dry. She is not diaphoretic.  Psychiatric: She has a normal mood and affect.     Assessment & Plan:   1. Annual physical exam Status post hysterectomy for fibroids  2. Screening for breast cancer - MM DIGITAL SCREENING BILATERAL; Future  3. Screening for colon cancer - Ambulatory referral to Gastroenterology  4. Screening for viral disease - HIV antibody; Future - Hepatitis C antibody, reflex; Future  5. Screening for metabolic disorder - COMPLETE METABOLIC PANEL WITH GFR; Future - Lipid Panel w/reflex Direct LDL; Future  6. Vaginal candidiasis Treated with Diflucan  7. Muscle spasm Refilled Flexeril   Meds ordered this encounter  Medications  . fluconazole (DIFLUCAN) 150 MG tablet    Sig: Take 1 tablet (150 mg total) by mouth once.    Dispense:  1 tablet    Refill:  0  . cyclobenzaprine (FLEXERIL) 10 MG tablet    Sig: Take 1 tablet (10 mg total) by mouth 2 (two) times daily as needed for muscle spasms.    Dispense:  60 tablet    Refill:  1    Follow-up: Return in about 3 months (around 10/31/2016) for coordination of care.   Arnoldo Morale MD

## 2016-08-03 NOTE — Progress Notes (Signed)
Is on a steroid for 30 days for thyroid- doesn't remember the name

## 2016-08-06 ENCOUNTER — Other Ambulatory Visit: Payer: Self-pay

## 2016-08-23 ENCOUNTER — Ambulatory Visit (AMBULATORY_SURGERY_CENTER): Payer: Self-pay

## 2016-08-23 VITALS — Ht 64.0 in | Wt 271.6 lb

## 2016-08-23 DIAGNOSIS — Z1211 Encounter for screening for malignant neoplasm of colon: Secondary | ICD-10-CM

## 2016-08-23 MED ORDER — NA SULFATE-K SULFATE-MG SULF 17.5-3.13-1.6 GM/177ML PO SOLN: 1.0000 | Freq: Once | ORAL | 0 refills | Status: AC

## 2016-08-23 NOTE — Progress Notes (Signed)
Denies allergies to eggs or soy products. Denies complication of anesthesia or sedation. Denies use of weight loss medication. Denies use of O2.   Emmi instructions declined by patient.  

## 2016-08-27 ENCOUNTER — Ambulatory Visit: Payer: Self-pay | Attending: Family Medicine

## 2016-09-04 ENCOUNTER — Telehealth: Payer: Self-pay | Admitting: Gastroenterology

## 2016-09-04 NOTE — Telephone Encounter (Signed)
Pre visit on 08-23-2016

## 2016-09-05 NOTE — Telephone Encounter (Signed)
Pt is 100% orange card.  She will come to 4th floor and pick up a sample of Suprep. Shafiq Larch/PV

## 2016-09-06 ENCOUNTER — Encounter: Payer: Self-pay | Admitting: Gastroenterology

## 2016-09-06 ENCOUNTER — Ambulatory Visit (AMBULATORY_SURGERY_CENTER): Payer: Self-pay | Admitting: Gastroenterology

## 2016-09-06 VITALS — BP 142/67 | HR 72 | Temp 97.3°F | Resp 19 | Ht 64.0 in | Wt 271.0 lb

## 2016-09-06 DIAGNOSIS — D123 Benign neoplasm of transverse colon: Secondary | ICD-10-CM

## 2016-09-06 DIAGNOSIS — D125 Benign neoplasm of sigmoid colon: Secondary | ICD-10-CM

## 2016-09-06 DIAGNOSIS — Z1211 Encounter for screening for malignant neoplasm of colon: Secondary | ICD-10-CM

## 2016-09-06 DIAGNOSIS — K635 Polyp of colon: Secondary | ICD-10-CM

## 2016-09-06 DIAGNOSIS — Z1212 Encounter for screening for malignant neoplasm of rectum: Secondary | ICD-10-CM

## 2016-09-06 MED ORDER — SODIUM CHLORIDE 0.9 % IV SOLN: 500.0000 mL | INTRAVENOUS | Status: DC

## 2016-09-06 NOTE — Progress Notes (Signed)
Pt's states no medical or surgical changes since previsit or office visit. 

## 2016-09-06 NOTE — Progress Notes (Signed)
A/ox3 pleased with MAC, report to Karen RN 

## 2016-09-06 NOTE — Progress Notes (Signed)
Called to room to assist during endoscopic procedure.  Patient ID and intended procedure confirmed with present staff. Received instructions for my participation in the procedure from the performing physician.  

## 2016-09-06 NOTE — Op Note (Signed)
[place] Patient Name: Olivia Schmidt Procedure Date: 09/06/2016 2:10 PM MRN: [id number] Endoscopist: Mallie Mussel L. Loletha Carrow , MD Age: 56 Referring MD:  Date of Birth: [birth year] Gender: Female Account #: [id number] Procedure:                Colonoscopy Indications:              Screening for colorectal malignant neoplasm, This                            is the patient's first colonoscopy Medicines:                Monitored Anesthesia Care Procedure:                Pre-Anesthesia Assessment:                           - Prior to the procedure, a History and Physical                            was performed, and patient medications and                            allergies were reviewed. The patient's tolerance of                            previous anesthesia was also reviewed. The risks                            and benefits of the procedure and the sedation                            options and risks were discussed with the patient.                            All questions were answered, and informed consent                            was obtained. Anticoagulants: The patient has taken                            aspirin. It was decided not to withhold this                            medication prior to the procedure. ASA Grade                            Assessment: III - A patient with severe systemic                            disease. After reviewing the risks and benefits,                            the patient was deemed in satisfactory condition to  undergo the procedure.                           After obtaining informed consent, the colonoscope                            was passed under direct vision. Throughout the                            procedure, the patient's blood pressure, pulse, and                            oxygen saturations were monitored continuously. The                            Colonoscope was introduced through the anus and                             advanced to the the cecum, identified by                            appendiceal orifice and ileocecal valve. The                            colonoscopy was performed without difficulty. The                            patient tolerated the procedure well. The quality                            of the bowel preparation was excellent. The                            ileocecal valve, appendiceal orifice, and rectum                            were photographed. The quality of the bowel                            preparation was evaluated using the BBPS [facility]                            Bowel Preparation Scale) with scores of: Right                            Colon = 3, Transverse Colon = 3 and Left Colon = 3                            (entire mucosa seen well with no residual staining,                            small fragments of stool or opaque liquid). The  total BBPS score equals 9. The bowel preparation                            used was SUPREP. Scope In: 2:19:18 PM Scope Out: 2:30:36 PM Scope Withdrawal Time: 0 hours 9 minutes 59 seconds  Total Procedure Duration: 0 hours 11 minutes 18 seconds  Findings:                 The perianal and digital rectal examinations were                            normal.                           A 2 mm polyp was found in the hepatic flexure. The                            polyp was sessile. The polyp was removed with a                            piecemeal technique using a cold biopsy forceps.                            Resection and retrieval were complete.                           A 4 mm polyp was found in the mid sigmoid colon.                            The polyp was sessile. The polyp was removed with a                            cold snare. Resection and retrieval were complete.                           The exam was otherwise without abnormality on                            direct and  retroflexion views. Complications:            No immediate complications. Estimated Blood Loss:     Estimated blood loss: none. Impression:               - One 2 mm polyp at the hepatic flexure, removed                            piecemeal using a cold biopsy forceps. Resected and                            retrieved.                           - One 4 mm polyp in the mid sigmoid colon, removed  with a cold snare. Resected and retrieved.                           - The examination was otherwise normal on direct                            and retroflexion views. Recommendation:           - Patient has a contact number available for                            emergencies. The signs and symptoms of potential                            delayed complications were discussed with the                            patient. Return to normal activities tomorrow.                            Written discharge instructions were provided to the                            patient.                           - Resume previous diet.                           - Continue present medications.                           - Await pathology results.                           - Repeat colonoscopy is recommended for                            surveillance. The colonoscopy date will be                            determined after pathology results from today's                            exam become available for review. Jachin Coury L. Loletha Carrow, MD 09/06/2016 2:34:19 PM This report has been signed electronically.

## 2016-09-06 NOTE — Patient Instructions (Signed)
HANDOUT GIVEN FOR POLYPS  YOU HAD AN ENDOSCOPIC PROCEDURE TODAY AT THE [facility]:   Refer to the procedure report that was given to you for any specific questions about what was found during the examination.  If the procedure report does not answer your questions, please call your gastroenterologist to clarify.  If you requested that your care partner not be given the details of your procedure findings, then the procedure report has been included in a sealed envelope for you to review at your convenience later.  YOU SHOULD EXPECT: Some feelings of bloating in the abdomen. Passage of more gas than usual.  Walking can help get rid of the air that was put into your GI tract during the procedure and reduce the bloating. If you had a lower endoscopy (such as a colonoscopy or flexible sigmoidoscopy) you may notice spotting of blood in your stool or on the toilet paper. If you underwent a bowel prep for your procedure, you may not have a normal bowel movement for a few days.  Please Note:  You might notice some irritation and congestion in your nose or some drainage.  This is from the oxygen used during your procedure.  There is no need for concern and it should clear up in a day or so.  SYMPTOMS TO REPORT IMMEDIATELY:   Following lower endoscopy (colonoscopy or flexible sigmoidoscopy):  Excessive amounts of blood in the stool  Significant tenderness or worsening of abdominal pains  Swelling of the abdomen that is new, acute  Fever of 100F or higher  For urgent or emergent issues, a gastroenterologist can be reached at any hour by calling [phone].   DIET:  We do recommend a small meal at first, but then you may proceed to your regular diet.  Drink plenty of fluids but you should avoid alcoholic beverages for 24 hours.  ACTIVITY:  You should plan to take it easy for the rest of today and you should NOT DRIVE or use heavy machinery until tomorrow (because of the sedation medicines  used during the test).    FOLLOW UP: Our staff will call the number listed on your records the next business day following your procedure to check on you and address any questions or concerns that you may have regarding the information given to you following your procedure. If we do not reach you, we will leave a message.  However, if you are feeling well and you are not experiencing any problems, there is no need to return our call.  We will assume that you have returned to your regular daily activities without incident.  If any biopsies were taken you will be contacted by phone or by letter within the next 1-3 weeks.  Please call us at [phone] if you have not heard about the biopsies in 3 weeks.    SIGNATURES/CONFIDENTIALITY: You and/or your care partner have signed paperwork which will be entered into your electronic medical record.  These signatures attest to the fact that that the information above on your After Visit Summary has been reviewed and is understood.  Full responsibility of the confidentiality of this discharge information lies with you and/or your care-partner. 

## 2016-09-10 ENCOUNTER — Telehealth: Payer: Self-pay

## 2016-09-10 ENCOUNTER — Telehealth: Payer: Self-pay | Admitting: *Deleted

## 2016-09-10 NOTE — Telephone Encounter (Signed)
  Follow up Call-  Call back number 09/06/2016  Post procedure Call Back phone  # [phone]  Permission to leave phone message Yes  Some recent data might be hidden    [facility]

## 2016-09-10 NOTE — Telephone Encounter (Signed)
Left message on answering machine. 

## 2016-09-17 ENCOUNTER — Encounter: Payer: Self-pay | Admitting: Gastroenterology

## 2016-10-11 ENCOUNTER — Emergency Department (HOSPITAL_COMMUNITY)
Admission: EM | Admit: 2016-10-11 | Discharge: 2016-10-11 | Disposition: A | Discharge status: Home / Self Care | Payer: Self-pay | Attending: Emergency Medicine | Admitting: Emergency Medicine

## 2016-10-11 ENCOUNTER — Encounter (HOSPITAL_COMMUNITY): Payer: Self-pay | Admitting: Emergency Medicine

## 2016-10-11 DIAGNOSIS — Z7982 Long term (current) use of aspirin: Secondary | ICD-10-CM | POA: Insufficient documentation

## 2016-10-11 DIAGNOSIS — R350 Frequency of micturition: Secondary | ICD-10-CM | POA: Insufficient documentation

## 2016-10-11 DIAGNOSIS — R300 Dysuria: Secondary | ICD-10-CM

## 2016-10-11 DIAGNOSIS — Z79899 Other long term (current) drug therapy: Secondary | ICD-10-CM | POA: Insufficient documentation

## 2016-10-11 DIAGNOSIS — N390 Urinary tract infection, site not specified: Secondary | ICD-10-CM | POA: Insufficient documentation

## 2016-10-11 DIAGNOSIS — Z87891 Personal history of nicotine dependence: Secondary | ICD-10-CM | POA: Insufficient documentation

## 2016-10-11 LAB — URINALYSIS, ROUTINE W REFLEX MICROSCOPIC
Bilirubin Urine: NEGATIVE
Glucose, UA: NEGATIVE mg/dL
Hgb urine dipstick: NEGATIVE
Ketones, ur: NEGATIVE mg/dL
Leukocytes, UA: NEGATIVE
Nitrite: POSITIVE — AB
PH: 5 (ref 5.0–8.0)
Protein, ur: NEGATIVE mg/dL
SPECIFIC GRAVITY, URINE: 1.017 (ref 1.005–1.030)

## 2016-10-11 MED ORDER — NITROFURANTOIN MONOHYD MACRO 100 MG PO CAPS
100.0000 mg | ORAL_CAPSULE | Freq: Once | ORAL | Status: AC
  Administered 2016-10-11: 100 mg via ORAL
  Filled 2016-10-11: qty 1

## 2016-10-11 MED ORDER — NITROFURANTOIN MONOHYD MACRO 100 MG PO CAPS: 100.0000 mg | ORAL_CAPSULE | Freq: Two times a day (BID) | ORAL | 0 refills | Status: DC

## 2016-10-11 NOTE — ED Provider Notes (Signed)
[facility] DEPT Provider Note   CSN: [id number] Arrival date & time: 10/11/16  1740     History   Chief Complaint Chief Complaint  Patient presents with  . Dysuria  . Urinary Frequency    HPI Olivia Schmidt is a 56 y.o. female with a PMHx of female infertility, uterine fibroids, hyperthyroidism, PAF, thrombocytopenia, anemia, and remote PID, with a PSHx of abd hysterectomy, who presents to the ED with complaints of "I think I have a UTI". Patient states that her symptoms started yesterday and feels the same as her prior UTIs. Symptoms include moderate burning dysuria with every urination, straining/hesitancy, and increased urinary frequency and urgency. Symptoms worsen with every urination, and have been mildly improved with Azo. She also tried one of her leftover Keflex 500 mg tablets from a prescription filled 10/24/15 (has 18 capsules left from an rx with sig: 1 cap PO 4 times daily). She has not taken anything else for her symptoms. She denies fevers, chills, CP, SOB, abd pain, N/V/D/C, hematuria, flank pain, vaginal bleeding/discharge, myalgias, arthralgias, numbness, tingling, focal weakness, or any other complaints at this time. Has a PCP appt in 5 days.   The history is provided by the patient and medical records. No language interpreter was used.  Dysuria   This is a new problem. The current episode started yesterday. The problem occurs every urination. The problem has not changed since onset.The quality of the pain is described as burning. The pain is moderate. There has been no fever. Associated symptoms include frequency, hesitancy and urgency. Pertinent negatives include no chills, no nausea, no vomiting, no discharge, no hematuria, no possible pregnancy and no flank pain. She has tried home medications for the symptoms.  Urinary Frequency  Pertinent negatives include no chest pain, no abdominal pain and no shortness of breath.    Past Medical History:  Diagnosis Date  .  Acute bronchitis 05/08/2015  . Anemia 05/07/2015  . Atrial fibrillation with rapid ventricular response ([place])   . Chest pain 05/07/2015  . FEMALE INFERTILITY 10/21/2008   Qualifier: Diagnosis of  By: Amil Amen MD, Benjamine Mola    . Fibroids   . FIBROIDS, UTERUS 07/11/2009   Qualifier: Diagnosis of  By: Amil Amen MD, Benjamine Mola    . Goiter   . Hyperthyroidism 05/07/2015  . Hypokalemia 05/08/2015  . Hypomagnesemia 05/08/2015  . PAF (paroxysmal atrial fibrillation) ([place]) 05/09/2015  . Thrombocytopenia ([place]) 05/09/2015    Patient Active Problem List   Diagnosis Date Noted  . Hypothyroidism 07/18/2016  . Myalgia 02/14/2016  . Goiter   . PAF (paroxysmal atrial fibrillation) ([place]) 05/09/2015  . Thrombocytopenia ([place]) 05/09/2015  . Atrial fibrillation with rapid ventricular response ([place])   . Acute bronchitis 05/08/2015  . Hypokalemia 05/08/2015  . Hypomagnesemia 05/08/2015  . Emesis   . Atrial fibrillation with RVR ([place]) 05/07/2015  . Chest pain 05/07/2015  . Anemia 05/07/2015  . FIBROIDS, UTERUS 07/11/2009  . PELVIC PAIN, RIGHT 06/30/2009  . FEMALE INFERTILITY 10/21/2008  . 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 ABDOMINAL HYSTERECTOMY    . I&D EXTREMITY Right 10/27/2015   Procedure: IRRIGATION AND DEBRIDEMENT RIGHT SMALL FINGER;  Surgeon: Leanora Cover, MD;  Location: [facility];  Service: Orthopedics;  Laterality: Right;  With local block    OB History    Gravida Para Term Preterm AB Living   1       1     SAB TAB Ectopic Multiple Live  Births                   Home Medications    Prior to Admission medications   Medication Sig Start Date End Date Taking? Authorizing Provider  aspirin EC 81 MG EC tablet Take 1 tablet (81 mg total) by mouth daily. 05/11/15   Eugenie Filler, MD  cyclobenzaprine (FLEXERIL) 10 MG tablet Take 1 tablet (10 mg total) by mouth 2 (two) times daily as needed for muscle spasms. 08/03/16   Arnoldo Morale, MD  ferrous sulfate 325 (65 FE) MG tablet Take 325 mg by mouth daily with breakfast.    Historical Provider, MD  levothyroxine (SYNTHROID, LEVOTHROID) 75 MCG tablet Take 1 tablet (75 mcg total) by mouth daily before breakfast. Patient not taking: Reported on 09/06/2016 07/18/16   Renato Shin, MD  Multiple Vitamin (MULTIVITAMIN) tablet Take 1 tablet by mouth daily.    Historical Provider, MD    Family History Family History  Problem Relation Age of Onset  . Aneurysm Father     brain  . Hypertension Sister   . Hypertension Brother   . Thyroid disease Neg Hx   . Colon cancer Neg Hx   . Esophageal cancer Neg Hx   . Rectal cancer Neg Hx   . Stomach cancer Neg Hx     Social History Social History  Substance Use Topics  . Smoking status: Former Smoker    Years: 24.00    Quit date: 06/12/2003  . Smokeless tobacco: Never Used  . Alcohol use No     Allergies   Patient has no known allergies.   Review of Systems Review of Systems  Constitutional: Negative for chills and fever.  Respiratory: Negative for shortness of breath.   Cardiovascular: Negative for chest pain.  Gastrointestinal: Negative for abdominal pain, constipation, diarrhea, nausea and vomiting.  Genitourinary: Positive for dysuria, frequency, hesitancy and urgency. Negative for flank pain, hematuria, vaginal bleeding and vaginal discharge.       +hesitancy/straining  Musculoskeletal: Negative for arthralgias and myalgias.  Skin: Negative for color change.  Allergic/Immunologic: Negative for immunocompromised state.  Neurological: Negative for weakness and numbness.  Psychiatric/Behavioral: Negative for confusion.   All other systems reviewed and are negative for acute change except as noted in the HPI.    Physical Exam Updated Vital Signs BP (!) 141/82 (BP Location: Right Arm)   Pulse 85   Temp 97.9 F (36.6 C) (Oral)   Resp 20   Ht 5\' 4"  (1.626 m)   Wt 122.9 kg   LMP 09/14/2010   SpO2 100%   BMI  46.52 kg/m   Physical Exam  Constitutional: She is oriented to person, place, and time. Vital signs are normal. She appears well-developed and well-nourished.  Non-toxic appearance. No distress.  Afebrile, nontoxic, NAD  HENT:  Head: Normocephalic and atraumatic.  Mouth/Throat: Oropharynx is clear and moist and mucous membranes are normal.  Eyes: Conjunctivae and EOM are normal. Right eye exhibits no discharge. Left eye exhibits no discharge.  Neck: Normal range of motion. Neck supple.  Cardiovascular: Normal rate, regular rhythm, normal heart sounds and intact distal pulses.  Exam reveals no gallop and no friction rub.   No murmur heard. Pulmonary/Chest: Effort normal and breath sounds normal. No respiratory distress. She has no decreased breath sounds. She has no wheezes. She has no rhonchi. She has no rales.  Abdominal: Soft. Normal appearance and bowel sounds are normal. She exhibits no distension. There is tenderness in  the suprapubic area. There is no rigidity, no rebound, no guarding, no CVA tenderness, no tenderness at McBurney's point and negative Murphy's sign.  Soft, obese but nondistended, +BS throughout, with very mild suprapubic discomfort but not really significantly tender, no r/g/r, neg murphy's, neg mcburney's, no CVA TTP   Musculoskeletal: Normal range of motion.  Neurological: She is alert and oriented to person, place, and time. She has normal strength. No sensory deficit.  Skin: Skin is warm, dry and intact. No rash noted.  Psychiatric: She has a normal mood and affect.  Nursing note and vitals reviewed.    ED Treatments / Results  Labs (all labs ordered are listed, but only abnormal results are displayed) Labs Reviewed  URINALYSIS, ROUTINE W REFLEX MICROSCOPIC - Abnormal; Notable for the following:       Result Value   Color, Urine AMBER (*)    Nitrite POSITIVE (*)    Bacteria, UA RARE (*)    Squamous Epithelial / LPF 0-5 (*)    All other components within  normal limits  URINE CULTURE    EKG  EKG Interpretation None       Radiology No results found.  Procedures Procedures (including critical care time)  Medications Ordered in ED Medications - No data to display   Initial Impression / Assessment and Plan / ED Course  I have reviewed the triage vital signs and the nursing notes.  Pertinent labs & imaging results that were available during my care of the patient were reviewed by me and considered in my medical decision making (see chart for details).     56 y.o. female here with UTI symptoms x1 day, feels like prior UTIs. U/A with 0-5 squamous so not very contaminated, +bacteria, 0-5 WBCs, and +nitrite (could be from Azo discoloration of urine, however color reported as amber not orange, plus other findings consistent with UTI, so likely truly positive finding). Mild suprapubic discomfort but no significant tenderness, no other areas of tenderness either. No flank tenderness. Doubt need for labs/imaging/etc. Will send for culture, and treat with macrobid. Advised OTC remedies for symptomatic relief, staying hydrated, and f/up with PCP in 1wk for recheck. I explained the diagnosis and have given explicit precautions to return to the ER including for any other new or worsening symptoms. The patient understands and accepts the medical plan as it's been dictated and I have answered their questions. Discharge instructions concerning home care and prescriptions have been given. The patient is STABLE and is discharged to home in good condition.     Final Clinical Impressions(s) / ED Diagnoses   Final diagnoses:  Lower urinary tract infectious disease  Dysuria  Urinary frequency    New Prescriptions New Prescriptions   NITROFURANTOIN, MACROCRYSTAL-MONOHYDRATE, (MACROBID) 100 MG CAPSULE    Take 1 capsule (100 mg total) by mouth 2 (two) times daily.     [address], PA-C 10/11/16 2044    Little, Wenda Overland, MD 10/13/16  [phone]

## 2016-10-11 NOTE — Discharge Instructions (Signed)
Stay very well hydrated with plenty of water throughout the day. Take antibiotic until completed. May consider over-the-counter Pyridium or Azo for pain relief, but don't take this longer than 3 days, and be aware that it may turn your urine bright orange. This is a harmless side effect. Alternate between tylenol and ibuprofen as needed for pain. Follow up with your primary care physician in 1 week for recheck of ongoing symptoms but return to ER for emergent changing or worsening of symptoms. Please seek immediate care if you develop the following: You develop back pain.  Your symptoms are no better, or worse in 3 days. There is severe back pain or lower abdominal pain.  You develop chills.  You have a fever.  There is nausea or vomiting.  There is continued burning or discomfort with urination.

## 2016-10-11 NOTE — ED Triage Notes (Signed)
Patient reports dysuria and urinary frequency since yesterday. Denies hematuria. Patient has been taking AZO with some relief.

## 2016-10-13 LAB — URINE CULTURE: Culture: <10000 — AB

## 2016-10-16 ENCOUNTER — Encounter: Payer: Self-pay | Admitting: Endocrinology

## 2016-10-16 ENCOUNTER — Ambulatory Visit (INDEPENDENT_AMBULATORY_CARE_PROVIDER_SITE_OTHER): Payer: Self-pay | Admitting: Endocrinology

## 2016-10-16 VITALS — BP 134/80 | HR 69 | Ht 64.0 in | Wt 277.0 lb

## 2016-10-16 DIAGNOSIS — E890 Postprocedural hypothyroidism: Secondary | ICD-10-CM

## 2016-10-16 LAB — TSH: TSH: 1.68 u[IU]/mL (ref 0.35–4.50)

## 2016-10-16 NOTE — Progress Notes (Signed)
Subjective:    Patient ID: Olivia Schmidt, female    DOB: [birth year], 56 y.o.   MRN: [id number]  HPI Pt returns for f/u of hyperthyroidism (due to [place]; dx'ed late 2016, when she presented with AF, and was rx'ed tapazole; she had RAI in Sept of 2017; she started synthroid in Feb of 2018).  pt states she feels well in general, except for weight gain.  She has not recently taken synthroid.  She says it caused arthralgias.   Past Medical History:  Diagnosis Date  . Acute bronchitis 05/08/2015  . Anemia 05/07/2015  . Atrial fibrillation with rapid ventricular response ([place])   . Chest pain 05/07/2015  . FEMALE INFERTILITY 10/21/2008   Qualifier: Diagnosis of  By: Amil Amen MD, Benjamine Mola    . Fibroids   . FIBROIDS, UTERUS 07/11/2009   Qualifier: Diagnosis of  By: Amil Amen MD, Benjamine Mola    . Goiter   . Hyperthyroidism 05/07/2015  . Hypokalemia 05/08/2015  . Hypomagnesemia 05/08/2015  . PAF (paroxysmal atrial fibrillation) ([place]) 05/09/2015  . Thrombocytopenia ([place]) 05/09/2015    Past Surgical History:  Procedure Laterality Date  . ABDOMINAL HYSTERECTOMY    . I&D EXTREMITY Right 10/27/2015   Procedure: IRRIGATION AND DEBRIDEMENT RIGHT SMALL FINGER;  Surgeon: Leanora Cover, MD;  Location: [facility];  Service: Orthopedics;  Laterality: Right;  With local block    Social History   Social History  . Marital status: Married    Spouse name: N/A  . Number of children: N/A  . Years of education: N/A   Occupational History  . Not on file.   Social History Main Topics  . Smoking status: Former Smoker    Years: 24.00    Quit date: 06/12/2003  . Smokeless tobacco: Never Used  . Alcohol use No  . Drug use: No  . Sexual activity: Yes    Birth control/ protection: Surgical   Other Topics Concern  . Not on file   Social History Narrative  . No narrative on file    Current Outpatient Prescriptions on File Prior to Visit  Medication Sig Dispense Refill  . aspirin EC 81 MG EC tablet  Take 1 tablet (81 mg total) by mouth daily.    . cyclobenzaprine (FLEXERIL) 10 MG tablet Take 1 tablet (10 mg total) by mouth 2 (two) times daily as needed for muscle spasms. 60 tablet 1   Current Facility-Administered Medications on File Prior to Visit  Medication Dose Route Frequency Provider Last Rate Last Dose  . 0.9 %  sodium chloride infusion  500 mL Intravenous Continuous Danis, Estill Cotta III, MD        No Known Allergies  Family History  Problem Relation Age of Onset  . Aneurysm Father     brain  . Hypertension Sister   . Hypertension Brother   . Thyroid disease Neg Hx   . Colon cancer Neg Hx   . Esophageal cancer Neg Hx   . Rectal cancer Neg Hx   . Stomach cancer Neg Hx     BP 134/80   Pulse 69   Ht 5\' 4"  (1.626 m)   Wt 277 lb (125.6 kg)   LMP 09/14/2010   SpO2 98%   BMI 47.55 kg/m    Review of Systems Denies dry skin    Objective:   Physical Exam VITAL SIGNS:  See vs page GENERAL: no distress Neck: thyroid is approx twice normal size, diffuse.   Skin: not diaphoretic Neuro: no tremor.  Lab Results  Component Value Date   TSH 1.68 10/16/2016   T3TOTAL 451 (H) 05/07/2015      Assessment & Plan:  Post-RAI hypothyroidism: euthyroid off rx, at least for now.  I advised pt to stay off, and please come back for a follow-up appointment in 2 months.

## 2016-10-16 NOTE — Patient Instructions (Addendum)
blood tests are requested for you today.  We'll let you know about the results.   Based on the results, you probably need to resume the thyroid pill.   Please come back for a follow-up appointment in 2 months.

## 2016-10-19 ENCOUNTER — Ambulatory Visit: Payer: Self-pay | Attending: Family Medicine

## 2016-10-31 ENCOUNTER — Ambulatory Visit: Payer: Self-pay | Admitting: Family Medicine

## 2016-12-17 ENCOUNTER — Other Ambulatory Visit: Payer: Self-pay | Admitting: Family Medicine

## 2016-12-17 ENCOUNTER — Ambulatory Visit (INDEPENDENT_AMBULATORY_CARE_PROVIDER_SITE_OTHER): Payer: Self-pay | Admitting: Endocrinology

## 2016-12-17 ENCOUNTER — Encounter: Payer: Self-pay | Admitting: Endocrinology

## 2016-12-17 DIAGNOSIS — E890 Postprocedural hypothyroidism: Secondary | ICD-10-CM

## 2016-12-17 DIAGNOSIS — E0590 Thyrotoxicosis, unspecified without thyrotoxic crisis or storm: Secondary | ICD-10-CM

## 2016-12-17 LAB — TSH: TSH: 1.03 u[IU]/mL (ref 0.35–4.50)

## 2016-12-17 LAB — T4, FREE: FREE T4: 0.72 ng/dL (ref 0.60–1.60)

## 2016-12-17 NOTE — Progress Notes (Signed)
Subjective:    Patient ID: Olivia Schmidt, female    DOB: Apr 13, 1961, 56 y.o.   MRN: [id number]  HPI Pt returns for f/u of hyperthyroidism (due to [place]; dx'ed late 2016, when she presented with AF, and was rx'ed tapazole; she had RAI in Sept of 2017; she started Synthroid in Feb of 2018; she stopped synthroid, due to arthralgias, but repeat TFT off it were normal).  pt states she feels well in general.  Past Medical History:  Diagnosis Date  . Acute bronchitis 05/08/2015  . Anemia 05/07/2015  . Atrial fibrillation with rapid ventricular response ([place])   . Chest pain 05/07/2015  . FEMALE INFERTILITY 10/21/2008   Qualifier: Diagnosis of  By: Amil Amen MD, Benjamine Mola    . Fibroids   . FIBROIDS, UTERUS 07/11/2009   Qualifier: Diagnosis of  By: Amil Amen MD, Benjamine Mola    . Goiter   . Hyperthyroidism 05/07/2015  . Hypokalemia 05/08/2015  . Hypomagnesemia 05/08/2015  . PAF (paroxysmal atrial fibrillation) ([place]) 05/09/2015  . Thrombocytopenia ([place]) 05/09/2015    Past Surgical History:  Procedure Laterality Date  . ABDOMINAL HYSTERECTOMY    . I&D EXTREMITY Right 10/27/2015   Procedure: IRRIGATION AND DEBRIDEMENT RIGHT SMALL FINGER;  Surgeon: Leanora Cover, MD;  Location: [facility];  Service: Orthopedics;  Laterality: Right;  With local block    Social History   Social History  . Marital status: Married    Spouse name: N/A  . Number of children: N/A  . Years of education: N/A   Occupational History  . Not on file.   Social History Main Topics  . Smoking status: Former Smoker    Years: 24.00    Quit date: 06/12/2003  . Smokeless tobacco: Never Used  . Alcohol use No  . Drug use: No  . Sexual activity: Yes    Birth control/ protection: Surgical   Other Topics Concern  . Not on file   Social History Narrative  . No narrative on file    Current Outpatient Prescriptions on File Prior to Visit  Medication Sig Dispense Refill  . aspirin EC 81 MG EC tablet Take 1 tablet (81  mg total) by mouth daily.    . cyclobenzaprine (FLEXERIL) 10 MG tablet Take 1 tablet (10 mg total) by mouth 2 (two) times daily as needed for muscle spasms. 60 tablet 1   Current Facility-Administered Medications on File Prior to Visit  Medication Dose Route Frequency Provider Last Rate Last Dose  . 0.9 %  sodium chloride infusion  500 mL Intravenous Continuous Danis, Estill Cotta III, MD        No Known Allergies  Family History  Problem Relation Age of Onset  . Aneurysm Father        brain  . Hypertension Sister   . Hypertension Brother   . Thyroid disease Neg Hx   . Colon cancer Neg Hx   . Esophageal cancer Neg Hx   . Rectal cancer Neg Hx   . Stomach cancer Neg Hx     BP 136/82   Pulse 69   Ht 5\' 4"  (1.626 m)   Wt 277 lb (125.6 kg)   LMP 09/14/2010   SpO2 97%   BMI 47.55 kg/m    Review of Systems Denies tremor and palpitations    Objective:   Physical Exam VITAL SIGNS:  See vs page GENERAL: no distress NECK: There is no palpable thyroid enlargement.  No thyroid nodule is palpable.  No palpable lymphadenopathy at  the anterior neck.   Skin: not diaphoretic.  Neuro: no tremor.       Assessment & Plan:  Post-RAI hypothyroidism, due for recheck.  Patient Instructions  blood tests are requested for you today.  We'll let you know about the results.   Please come back for a follow-up appointment in 3 months.

## 2016-12-17 NOTE — Addendum Note (Signed)
Addended by: Kaylyn Lim I on: 12/17/2016 09:08 AM   Modules accepted: Orders

## 2016-12-17 NOTE — Patient Instructions (Addendum)
blood tests are requested for you today.  We'll let you know about the results.   Please come back for a follow-up appointment in 3 months.   

## 2016-12-20 ENCOUNTER — Ambulatory Visit: Payer: Self-pay | Attending: Family Medicine

## 2017-01-28 ENCOUNTER — Ambulatory Visit: Payer: Self-pay | Attending: Family Medicine | Admitting: Family Medicine

## 2017-01-28 ENCOUNTER — Encounter: Payer: Self-pay | Admitting: Family Medicine

## 2017-01-28 VITALS — BP 136/78 | HR 75 | Temp 97.4°F | Ht 64.0 in | Wt 285.6 lb

## 2017-01-28 DIAGNOSIS — Z6841 Body Mass Index (BMI) 40.0 and over, adult: Secondary | ICD-10-CM | POA: Insufficient documentation

## 2017-01-28 DIAGNOSIS — N3941 Urge incontinence: Secondary | ICD-10-CM | POA: Insufficient documentation

## 2017-01-28 DIAGNOSIS — Z7982 Long term (current) use of aspirin: Secondary | ICD-10-CM | POA: Insufficient documentation

## 2017-01-28 DIAGNOSIS — N390 Urinary tract infection, site not specified: Secondary | ICD-10-CM | POA: Insufficient documentation

## 2017-01-28 DIAGNOSIS — I480 Paroxysmal atrial fibrillation: Secondary | ICD-10-CM | POA: Insufficient documentation

## 2017-01-28 DIAGNOSIS — E039 Hypothyroidism, unspecified: Secondary | ICD-10-CM | POA: Insufficient documentation

## 2017-01-28 LAB — POCT URINALYSIS DIPSTICK
Bilirubin, UA: NEGATIVE
GLUCOSE UA: NEGATIVE
Ketones, UA: NEGATIVE
Leukocytes, UA: NEGATIVE
NITRITE UA: NEGATIVE
PH UA: 6 (ref 5.0–8.0)
PROTEIN UA: NEGATIVE
Spec Grav, UA: 1.025 (ref 1.010–1.025)
UROBILINOGEN UA: 0.2 U/dL

## 2017-01-28 MED ORDER — CYCLOBENZAPRINE HCL 10 MG PO TABS: 10.0000 mg | ORAL_TABLET | Freq: Two times a day (BID) | ORAL | 1 refills | Status: DC | PRN

## 2017-01-28 NOTE — Patient Instructions (Signed)

## 2017-01-28 NOTE — Progress Notes (Signed)
Subjective:  Patient ID: Olivia Schmidt, female    DOB: Mar 30, 1961  Age: 56 y.o. MRN: [id number]  CC: Urinary Tract Infection   HPI Olivia Schmidt is a 56 year old female with a history of obesity, Graves' disease (status post reactive iodine with subsequent hypothyroidism currently euthyroid off medications). She presents today with a three-week history of urinary frequency, urgency and some dysuria which led her to take AZO OTC with subsequent improvement in her symptoms. She denies flank pain or abdominal pain and has no fevers, nausea or vomiting.  She is requesting Flexeril which she takes intermittently for back muscle spasms. She is wondering why she has been unable to lose weight however she has not been actively exercising or trying to eat healthy.  Past Medical History:  Diagnosis Date  . Acute bronchitis 05/08/2015  . Anemia 05/07/2015  . Atrial fibrillation with rapid ventricular response ([place])   . Chest pain 05/07/2015  . FEMALE INFERTILITY 10/21/2008   Qualifier: Diagnosis of  By: Amil Amen MD, Benjamine Mola    . Fibroids   . FIBROIDS, UTERUS 07/11/2009   Qualifier: Diagnosis of  By: Amil Amen MD, Benjamine Mola    . Goiter   . Hyperthyroidism 05/07/2015  . Hypokalemia 05/08/2015  . Hypomagnesemia 05/08/2015  . PAF (paroxysmal atrial fibrillation) ([place]) 05/09/2015  . Thrombocytopenia ([place]) 05/09/2015    Past Surgical History:  Procedure Laterality Date  . ABDOMINAL HYSTERECTOMY    . I&D EXTREMITY Right 10/27/2015   Procedure: IRRIGATION AND DEBRIDEMENT RIGHT SMALL FINGER;  Surgeon: Leanora Cover, MD;  Location: [facility];  Service: Orthopedics;  Laterality: Right;  With local block    No Known Allergies   Outpatient Medications Prior to Visit  Medication Sig Dispense Refill  . aspirin EC 81 MG EC tablet Take 1 tablet (81 mg total) by mouth daily.    . cyclobenzaprine (FLEXERIL) 10 MG tablet Take 1 tablet (10 mg total) by mouth 2 (two) times daily as needed for muscle  spasms. 60 tablet 1   Facility-Administered Medications Prior to Visit  Medication Dose Route Frequency Provider Last Rate Last Dose  . 0.9 %  sodium chloride infusion  500 mL Intravenous Continuous Danis, Estill Cotta III, MD        ROS Review of Systems  Constitutional: Negative for activity change, appetite change and fatigue.  HENT: Negative for congestion, sinus pressure and sore throat.   Eyes: Negative for visual disturbance.  Respiratory: Negative for cough, chest tightness, shortness of breath and wheezing.   Cardiovascular: Negative for chest pain and palpitations.  Gastrointestinal: Negative for abdominal distention, abdominal pain and constipation.  Endocrine: Negative for polydipsia.  Genitourinary: Positive for frequency. Negative for dysuria.  Musculoskeletal: Negative for arthralgias and back pain.  Skin: Negative for rash.  Neurological: Negative for tremors, light-headedness and numbness.  Hematological: Does not bruise/bleed easily.  Psychiatric/Behavioral: Negative for agitation and behavioral problems.    Objective:  BP 136/78   Pulse 75   Temp (!) 97.4 F (36.3 C) (Oral)   Ht 5\' 4"  (1.626 m)   Wt 285 lb 9.6 oz (129.5 kg)   LMP 09/14/2010   SpO2 98%   BMI 49.02 kg/m   BP/Weight 01/28/2017 06/19/6220 02/16/9891  Systolic BP 119 417 408  Diastolic BP 78 82 80  Wt. (Lbs) 285.6 277 277  BMI 49.02 47.55 47.55      Physical Exam  Constitutional: She is oriented to person, place, and time. She appears well-developed and well-nourished.  Cardiovascular: Normal rate, normal  heart sounds and intact distal pulses.   No murmur heard. Pulmonary/Chest: Effort normal and breath sounds normal. She has no wheezes. She has no rales. She exhibits no tenderness.  Abdominal: Soft. Bowel sounds are normal. She exhibits no distension and no mass. There is no tenderness.  Musculoskeletal: Normal range of motion. She exhibits no tenderness (no CVA tenderness).  Neurological: She  is alert and oriented to person, place, and time.     Assessment & Plan:   1. Urinary tract infection without hematuria, site unspecified UA negative for UTI Probably due to recent AZO use Symptoms have improved Advised to increase intake of cranberry juice, avoid holding urine - POCT urinalysis dipstick  2. Urge incontinence Discussed Kegel exercises  3. Morbid obesity ([place]) Encouraged reducing portion sizes 150 minutes per week of active exercise Healthy food choices   Meds ordered this encounter  Medications  . cyclobenzaprine (FLEXERIL) 10 MG tablet    Sig: Take 1 tablet (10 mg total) by mouth 2 (two) times daily as needed for muscle spasms.    Dispense:  60 tablet    Refill:  1    Follow-up: Return in about 3 months (around 04/30/2017) for follow up of urge incontinence.   This note has been created with Surveyor, quantity. Any transcriptional errors are unintentional.     Arnoldo Morale MD

## 2017-03-19 ENCOUNTER — Encounter: Payer: Self-pay | Admitting: Endocrinology

## 2017-03-19 ENCOUNTER — Ambulatory Visit: Payer: Self-pay | Admitting: Endocrinology

## 2017-03-19 ENCOUNTER — Ambulatory Visit (INDEPENDENT_AMBULATORY_CARE_PROVIDER_SITE_OTHER): Payer: Self-pay | Admitting: Endocrinology

## 2017-03-19 DIAGNOSIS — E0500 Thyrotoxicosis with diffuse goiter without thyrotoxic crisis or storm: Secondary | ICD-10-CM

## 2017-03-19 LAB — TSH: TSH: 4.05 u[IU]/mL (ref 0.35–4.50)

## 2017-03-19 LAB — T4, FREE: FREE T4: 0.82 ng/dL (ref 0.60–1.60)

## 2017-03-19 NOTE — Progress Notes (Signed)
Subjective:    Patient ID: Olivia Schmidt Minor, female    DOB: [birth year], 56 y.o.   MRN: [id number]  HPI Pt returns for f/u of hyperthyroidism (due to [place]; dx'ed late 2016, when she presented with AF, and was rx'ed tapazole; she had RAI in Sept of 2017; she started Synthroid in Feb of 2018; she stopped synthroid, due to arthralgias, but repeat TFT off it were normal).  pt states she feels well in general, except for weight gain.  Past Medical History:  Diagnosis Date  . Acute bronchitis 05/08/2015  . Anemia 05/07/2015  . Atrial fibrillation with rapid ventricular response ([place])   . Chest pain 05/07/2015  . FEMALE INFERTILITY 10/21/2008   Qualifier: Diagnosis of  By: Amil Amen MD, Benjamine Mola    . Fibroids   . FIBROIDS, UTERUS 07/11/2009   Qualifier: Diagnosis of  By: Amil Amen MD, Benjamine Mola    . Goiter   . Hyperthyroidism 05/07/2015  . Hypokalemia 05/08/2015  . Hypomagnesemia 05/08/2015  . PAF (paroxysmal atrial fibrillation) ([place]) 05/09/2015  . Thrombocytopenia ([place]) 05/09/2015    Past Surgical History:  Procedure Laterality Date  . ABDOMINAL HYSTERECTOMY    . I&D EXTREMITY Right 10/27/2015   Procedure: IRRIGATION AND DEBRIDEMENT RIGHT SMALL FINGER;  Surgeon: Leanora Cover, MD;  Location: [facility];  Service: Orthopedics;  Laterality: Right;  With local block    Social History   Social History  . Marital status: Married    Spouse name: N/A  . Number of children: N/A  . Years of education: N/A   Occupational History  . Not on file.   Social History Main Topics  . Smoking status: Former Smoker    Years: 24.00    Quit date: 06/12/2003  . Smokeless tobacco: Never Used  . Alcohol use No  . Drug use: No  . Sexual activity: Yes    Birth control/ protection: Surgical   Other Topics Concern  . Not on file   Social History Narrative  . No narrative on file    Current Outpatient Prescriptions on File Prior to Visit  Medication Sig Dispense Refill  . aspirin EC 81 MG EC  tablet Take 1 tablet (81 mg total) by mouth daily.    . cyclobenzaprine (FLEXERIL) 10 MG tablet Take 1 tablet (10 mg total) by mouth 2 (two) times daily as needed for muscle spasms. 60 tablet 1   Current Facility-Administered Medications on File Prior to Visit  Medication Dose Route Frequency Provider Last Rate Last Dose  . 0.9 %  sodium chloride infusion  500 mL Intravenous Continuous Danis, Estill Cotta III, MD        No Known Allergies  Family History  Problem Relation Age of Onset  . Aneurysm Father        brain  . Hypertension Sister   . Hypertension Brother   . Thyroid disease Neg Hx   . Colon cancer Neg Hx   . Esophageal cancer Neg Hx   . Rectal cancer Neg Hx   . Stomach cancer Neg Hx     BP 140/78   Pulse 65   Wt 281 lb 3.2 oz (127.6 kg)   LMP 09/14/2010   SpO2 98%   BMI 48.27 kg/m    Review of Systems Denies dry skin.     Objective:   Physical Exam VITAL SIGNS:  See vs page GENERAL: no distress NECK: There is no palpable thyroid enlargement.  No thyroid nodule is palpable.  No palpable lymphadenopathy at the  anterior neck.   Skin: not diaphoretic.  Neuro: no tremor.      Assessment & Plan:  Hyperthyroidism: due for recheck.    Patient Instructions  blood tests are requested for you today.  We'll let you know about the results.   Please come back for a follow-up appointment in 6 months.

## 2017-03-19 NOTE — Patient Instructions (Addendum)
blood tests are requested for you today.  We'll let you know about the results.   Please come back for a follow-up appointment in 6 months.  

## 2017-07-16 ENCOUNTER — Ambulatory Visit: Payer: Self-pay | Attending: Family Medicine

## 2017-07-16 DIAGNOSIS — Z111 Encounter for screening for respiratory tuberculosis: Secondary | ICD-10-CM | POA: Insufficient documentation

## 2017-07-16 NOTE — Progress Notes (Signed)
Patient came in office today for TB screening test.  Patient was given on left arm and return on Thursday for reading.

## 2017-07-18 ENCOUNTER — Ambulatory Visit: Payer: Self-pay | Attending: Family Medicine

## 2017-07-18 LAB — TB SKIN TEST
Induration: 0 mm
TB SKIN TEST: NEGATIVE

## 2017-09-17 ENCOUNTER — Ambulatory Visit: Payer: Self-pay | Admitting: Endocrinology

## 2017-09-17 DIAGNOSIS — Z0289 Encounter for other administrative examinations: Secondary | ICD-10-CM

## 2017-09-30 ENCOUNTER — Emergency Department (HOSPITAL_COMMUNITY): Payer: BC Managed Care – PPO

## 2017-09-30 ENCOUNTER — Encounter (HOSPITAL_COMMUNITY): Payer: Self-pay | Admitting: Emergency Medicine

## 2017-09-30 DIAGNOSIS — I480 Paroxysmal atrial fibrillation: Secondary | ICD-10-CM | POA: Diagnosis not present

## 2017-09-30 DIAGNOSIS — Z7982 Long term (current) use of aspirin: Secondary | ICD-10-CM | POA: Insufficient documentation

## 2017-09-30 DIAGNOSIS — M7651 Patellar tendinitis, right knee: Secondary | ICD-10-CM | POA: Diagnosis not present

## 2017-09-30 DIAGNOSIS — M25561 Pain in right knee: Secondary | ICD-10-CM | POA: Diagnosis present

## 2017-09-30 DIAGNOSIS — Z87891 Personal history of nicotine dependence: Secondary | ICD-10-CM | POA: Diagnosis not present

## 2017-09-30 NOTE — ED Triage Notes (Signed)
Patient c/o right knee pain with swelling x1 week. Reports pain worsens with movement. Denies injury. Ambulatory.

## 2017-10-01 ENCOUNTER — Emergency Department (HOSPITAL_COMMUNITY)
Admission: EM | Admit: 2017-10-01 | Discharge: 2017-10-01 | Disposition: A | Discharge status: Home / Self Care | Payer: BC Managed Care – PPO | Attending: Emergency Medicine | Admitting: Emergency Medicine

## 2017-10-01 DIAGNOSIS — M7651 Patellar tendinitis, right knee: Secondary | ICD-10-CM

## 2017-10-01 DIAGNOSIS — M25561 Pain in right knee: Secondary | ICD-10-CM

## 2017-10-01 MED ORDER — ACETAMINOPHEN ER 650 MG PO TBCR: 650.0000 mg | EXTENDED_RELEASE_TABLET | Freq: Three times a day (TID) | ORAL | 0 refills | Status: DC | PRN

## 2017-10-01 MED ORDER — IBUPROFEN 200 MG PO TABS
600.0000 mg | ORAL_TABLET | Freq: Once | ORAL | Status: AC
  Administered 2017-10-01: 600 mg via ORAL
  Filled 2017-10-01: qty 3

## 2017-10-01 MED ORDER — ACETAMINOPHEN 325 MG PO TABS
650.0000 mg | ORAL_TABLET | Freq: Once | ORAL | Status: AC
  Administered 2017-10-01: 650 mg via ORAL
  Filled 2017-10-01: qty 2

## 2017-10-01 MED ORDER — IBUPROFEN 600 MG PO TABS: 600.0000 mg | ORAL_TABLET | Freq: Four times a day (QID) | ORAL | 0 refills | Status: DC | PRN

## 2017-10-01 NOTE — ED Provider Notes (Signed)
[facility] DEPT Provider Note   CSN: [id number] Arrival date & time: 09/30/17  2044     History   Chief Complaint Chief Complaint  Patient presents with  . Knee Pain    HPI Olivia Schmidt is a 57 y.o. female.  HPI 57 year old female comes in a chief complaint of knee pain. Patient states that for the last 1 week she has been having right-sided knee pain.  Pain is located right above the patella.  Patient denies any history of trauma.  Patient has no history of DVT-PE and has no risk factors for it.  Patient's pain has gotten worse over time so she decided to come into the ED.  Past Medical History:  Diagnosis Date  . Acute bronchitis 05/08/2015  . Anemia 05/07/2015  . Atrial fibrillation with rapid ventricular response ([place])   . Chest pain 05/07/2015  . FEMALE INFERTILITY 10/21/2008   Qualifier: Diagnosis of  By: Amil Amen MD, Benjamine Mola    . Fibroids   . FIBROIDS, UTERUS 07/11/2009   Qualifier: Diagnosis of  By: Amil Amen MD, Benjamine Mola    . Goiter   . Hyperthyroidism 05/07/2015  . Hypokalemia 05/08/2015  . Hypomagnesemia 05/08/2015  . PAF (paroxysmal atrial fibrillation) ([place]) 05/09/2015  . Thrombocytopenia ([place]) 05/09/2015    Patient Active Problem List   Diagnosis Date Noted  . Graves disease 03/19/2017  . Morbid obesity ([place]) 01/28/2017  . Myalgia 02/14/2016  . Goiter   . PAF (paroxysmal atrial fibrillation) ([place]) 05/09/2015  . Thrombocytopenia ([place]) 05/09/2015  . Atrial fibrillation with rapid ventricular response ([place])   . Acute bronchitis 05/08/2015  . Hypokalemia 05/08/2015  . Hypomagnesemia 05/08/2015  . Emesis   . Atrial fibrillation with RVR ([place]) 05/07/2015  . Chest pain 05/07/2015  . Anemia 05/07/2015  . FIBROIDS, UTERUS 07/11/2009  . PELVIC PAIN, RIGHT 06/30/2009  . FEMALE INFERTILITY 10/21/2008  . 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 ABDOMINAL  HYSTERECTOMY    . I&D EXTREMITY Right 10/27/2015   Procedure: IRRIGATION AND DEBRIDEMENT RIGHT SMALL FINGER;  Surgeon: Leanora Cover, MD;  Location: [facility];  Service: Orthopedics;  Laterality: Right;  With local block     OB History    Gravida  1   Para      Term      Preterm      AB  1   Living        SAB      TAB      Ectopic      Multiple      Live Births               Home Medications    Prior to Admission medications   Medication Sig Start Date End Date Taking? Authorizing Provider  acetaminophen (TYLENOL 8 HOUR) 650 MG CR tablet Take 1 tablet (650 mg total) by mouth every 8 (eight) hours as needed. 10/01/17   Varney Biles, MD  aspirin EC 81 MG EC tablet Take 1 tablet (81 mg total) by mouth daily. 05/11/15   Eugenie Filler, MD  cyclobenzaprine (FLEXERIL) 10 MG tablet Take 1 tablet (10 mg total) by mouth 2 (two) times daily as needed for muscle spasms. 01/28/17   Charlott Rakes, MD  ibuprofen (ADVIL,MOTRIN) 600 MG tablet Take 1 tablet (600 mg total) by mouth every 6 (six) hours as needed. 10/01/17   Varney Biles, MD    Family History Family History  Problem Relation Age of Onset  . Aneurysm Father        brain  . Hypertension Sister   . Hypertension Brother   . Thyroid disease Neg Hx   . Colon cancer Neg Hx   . Esophageal cancer Neg Hx   . Rectal cancer Neg Hx   . Stomach cancer Neg Hx     Social History Social History   Tobacco Use  . Smoking status: Former Smoker    Years: 24.00    Last attempt to quit: 06/12/2003    Years since quitting: 14.3  . Smokeless tobacco: Never Used  Substance Use Topics  . Alcohol use: No  . Drug use: No     Allergies   Patient has no known allergies.   Review of Systems Review of Systems  Constitutional: Positive for activity change.  Musculoskeletal: Positive for arthralgias.  Allergic/Immunologic: Negative for immunocompromised state.  Hematological: Does not bruise/bleed easily.      Physical Exam Updated Vital Signs BP (!) 160/63   Pulse 71   Temp 98.1 F (36.7 C) (Oral)   Resp 19   Ht 5\' 4"  (1.626 m)   Wt 110.7 kg (244 lb)   LMP 09/14/2010   SpO2 100%   BMI 41.88 kg/m   Physical Exam  Constitutional: She is oriented to person, place, and time. She appears well-developed.  HENT:  Head: Normocephalic and atraumatic.  Eyes: EOM are normal.  Neck: Normal range of motion. Neck supple.  Cardiovascular: Normal rate.  Pulmonary/Chest: Effort normal.  Abdominal: Bowel sounds are normal.  Musculoskeletal:  No laxity of the right knee with anterior or posterior drawer.  Valgus and varus stress did not reveal significant tenderness either.  Patient is having focal tenderness over the suprapatellar region with palpation.  Neurological: She is alert and oriented to person, place, and time.  Skin: Skin is warm and dry.  Nursing note and vitals reviewed.    ED Treatments / Results  Labs (all labs ordered are listed, but only abnormal results are displayed) Labs Reviewed - No data to display  EKG None  Radiology Dg Knee Complete 4 Views Right  Result Date: 09/30/2017 CLINICAL DATA:  Right anterior knee pain superior to the patella. EXAM: RIGHT KNEE - COMPLETE 4+ VIEW COMPARISON:  None. FINDINGS: No evidence of fracture, dislocation, or joint effusion. Minimal osteoarthritic changes of all 3 compartments of the knee joint. Enthesophyte is seen off of the superior aspect of the patella. IMPRESSION: No evidence of fracture or subluxation of the right knee. Enthesophyte off of the superior aspect of the patella may be due to posttraumatic changes or chronic calcific tendinitis. Electronically Signed   By: Fidela Salisbury M.D.   On: 09/30/2017 22:04    Procedures Procedures (including critical care time)  Medications Ordered in ED Medications  ibuprofen (ADVIL,MOTRIN) tablet 600 mg (has no administration in time range)  acetaminophen (TYLENOL) tablet  650 mg (has no administration in time range)     Initial Impression / Assessment and Plan / ED Course  I have reviewed the triage vital signs and the nursing notes.  Pertinent labs & imaging results that were available during my care of the patient were reviewed by me and considered in my medical decision making (see chart for details).    Patient comes in with right-sided knee pain that has been going on for 1 week.  Patient does not have any laxity on my exam and there is no deformity or fracture.  X-ray reveals mild osteoarthritis.  I suspect the patient likely has patellar tendinitis versus osteoarthritis as the etiology for the pain. RICE advised.  Final Clinical Impressions(s) / ED Diagnoses   Final diagnoses:  Patellar tendinitis of right knee  Acute pain of right knee    ED Discharge Orders        Ordered    ibuprofen (ADVIL,MOTRIN) 600 MG tablet  Every 6 hours PRN     10/01/17 0310    acetaminophen (TYLENOL 8 HOUR) 650 MG CR tablet  Every 8 hours PRN     10/01/17 0310       Varney Biles, MD 10/03/17 0106

## 2017-10-01 NOTE — Discharge Instructions (Signed)
Please follow the RICE treatment as requested. Take the medicines prescribed. See the Orthopedist in 2 week.

## 2017-12-20 ENCOUNTER — Other Ambulatory Visit: Payer: Self-pay | Admitting: Nurse Practitioner

## 2017-12-20 DIAGNOSIS — Z1231 Encounter for screening mammogram for malignant neoplasm of breast: Secondary | ICD-10-CM

## 2019-06-25 ENCOUNTER — Other Ambulatory Visit: Payer: Self-pay | Admitting: Nurse Practitioner

## 2019-06-25 DIAGNOSIS — Z1231 Encounter for screening mammogram for malignant neoplasm of breast: Secondary | ICD-10-CM

## 2020-05-25 ENCOUNTER — Other Ambulatory Visit (HOSPITAL_COMMUNITY): Payer: Self-pay | Admitting: Nurse Practitioner

## 2020-06-22 ENCOUNTER — Other Ambulatory Visit (HOSPITAL_COMMUNITY): Payer: Self-pay | Admitting: Nurse Practitioner

## 2020-07-20 ENCOUNTER — Other Ambulatory Visit (HOSPITAL_COMMUNITY): Payer: Self-pay | Admitting: Nurse Practitioner

## 2020-08-17 ENCOUNTER — Other Ambulatory Visit (HOSPITAL_COMMUNITY): Payer: Self-pay | Admitting: Nurse Practitioner

## 2020-09-16 ENCOUNTER — Other Ambulatory Visit (HOSPITAL_COMMUNITY): Payer: Self-pay

## 2020-09-16 MED ORDER — VITAMIN D (ERGOCALCIFEROL) 1.25 MG (50000 UNIT) PO CAPS
1.0000 | ORAL_CAPSULE | ORAL | 1 refills | Status: DC
  Filled 2020-09-16: qty 12, 84d supply, fill #0

## 2020-09-16 MED ORDER — MAGNESIUM 250 MG PO TABS: 1.0000 | ORAL_TABLET | Freq: Every day | ORAL | 1 refills | Status: DC

## 2020-09-16 MED ORDER — PHENTERMINE HCL 37.5 MG PO TABS
37.5000 mg | ORAL_TABLET | Freq: Every day | ORAL | 0 refills | Status: DC
  Filled 2020-09-16: qty 30, 30d supply, fill #0

## 2020-09-16 MED ORDER — LISINOPRIL 10 MG PO TABS
10.0000 mg | ORAL_TABLET | Freq: Every evening | ORAL | 1 refills | Status: DC
  Filled 2020-09-16: qty 90, 90d supply, fill #0

## 2020-09-16 MED ORDER — OXYCODONE-ACETAMINOPHEN 10-325 MG PO TABS
ORAL_TABLET | ORAL | 0 refills | Status: DC
  Filled 2020-09-21: qty 150, 30d supply, fill #0

## 2020-09-16 MED ORDER — CYCLOBENZAPRINE HCL 10 MG PO TABS
10.0000 mg | ORAL_TABLET | Freq: Three times a day (TID) | ORAL | 0 refills | Status: DC
  Filled 2020-09-16: qty 90, 30d supply, fill #0

## 2020-09-16 MED ORDER — NAPROXEN SODIUM ER 375 MG PO TB24
375.0000 mg | ORAL_TABLET | Freq: Every day | ORAL | 1 refills | Status: DC
  Filled 2020-09-16: qty 90, 90d supply, fill #0

## 2020-09-19 ENCOUNTER — Other Ambulatory Visit (HOSPITAL_COMMUNITY): Payer: Self-pay

## 2020-09-20 ENCOUNTER — Other Ambulatory Visit (HOSPITAL_COMMUNITY): Payer: Self-pay

## 2020-09-21 ENCOUNTER — Other Ambulatory Visit (HOSPITAL_COMMUNITY): Payer: Self-pay

## 2020-09-22 ENCOUNTER — Encounter (HOSPITAL_COMMUNITY): Payer: Self-pay

## 2020-09-22 ENCOUNTER — Ambulatory Visit (HOSPITAL_COMMUNITY)
Admission: EM | Admit: 2020-09-22 | Discharge: 2020-09-22 | Disposition: A | Discharge status: Home / Self Care | Payer: BC Managed Care – PPO | Attending: Internal Medicine | Admitting: Internal Medicine

## 2020-09-22 ENCOUNTER — Other Ambulatory Visit: Payer: Self-pay

## 2020-09-22 DIAGNOSIS — Z20822 Contact with and (suspected) exposure to covid-19: Secondary | ICD-10-CM | POA: Insufficient documentation

## 2020-09-22 DIAGNOSIS — Z0189 Encounter for other specified special examinations: Secondary | ICD-10-CM

## 2020-09-22 LAB — SARS CORONAVIRUS 2 (TAT 6-24 HRS): SARS Coronavirus 2: NEGATIVE

## 2020-09-22 NOTE — ED Triage Notes (Signed)
Pt presents for COVID testing. Pt denies fever, shortness of breath, cough, or any other symptoms.    

## 2020-10-12 ENCOUNTER — Other Ambulatory Visit (HOSPITAL_COMMUNITY): Payer: Self-pay

## 2020-10-12 MED ORDER — OXYCODONE-ACETAMINOPHEN 10-325 MG PO TABS
ORAL_TABLET | ORAL | 0 refills | Status: DC
  Filled 2020-10-19: qty 150, 30d supply, fill #0

## 2020-10-12 MED ORDER — PHENTERMINE HCL 37.5 MG PO TABS
ORAL_TABLET | ORAL | 0 refills | Status: DC
  Filled 2020-10-19: qty 30, 30d supply, fill #0

## 2020-10-12 MED ORDER — VITAMIN D (ERGOCALCIFEROL) 1.25 MG (50000 UNIT) PO CAPS
ORAL_CAPSULE | ORAL | 1 refills | Status: DC
  Filled 2020-10-12: qty 12, 84d supply, fill #0

## 2020-10-12 MED ORDER — LISINOPRIL 10 MG PO TABS
ORAL_TABLET | ORAL | 1 refills | Status: DC
  Filled 2020-10-12: qty 90, 90d supply, fill #0

## 2020-10-12 MED ORDER — CYCLOBENZAPRINE HCL 10 MG PO TABS
ORAL_TABLET | ORAL | 0 refills | Status: DC
  Filled 2020-10-12: qty 90, 30d supply, fill #0

## 2020-10-12 MED ORDER — METOPROLOL SUCCINATE ER 25 MG PO TB24
ORAL_TABLET | ORAL | 1 refills | Status: DC
  Filled 2020-10-12: qty 90, 90d supply, fill #0

## 2020-10-12 MED ORDER — TOPIRAMATE 50 MG PO TABS
50.0000 mg | ORAL_TABLET | Freq: Every day | ORAL | 0 refills | Status: DC
  Filled 2020-10-12: qty 30, 30d supply, fill #0

## 2020-10-19 ENCOUNTER — Other Ambulatory Visit (HOSPITAL_COMMUNITY): Payer: Self-pay

## 2020-10-26 ENCOUNTER — Other Ambulatory Visit (HOSPITAL_BASED_OUTPATIENT_CLINIC_OR_DEPARTMENT_OTHER): Payer: Self-pay

## 2020-11-01 ENCOUNTER — Other Ambulatory Visit (HOSPITAL_COMMUNITY): Payer: Self-pay

## 2020-11-01 MED FILL — Semaglutide Soln Pen-inj 1 MG/DOSE (4 MG/3ML): SUBCUTANEOUS | 84 days supply | Qty: 9 | Fill #0 | Status: CN

## 2020-11-11 ENCOUNTER — Other Ambulatory Visit (HOSPITAL_COMMUNITY): Payer: Self-pay

## 2020-11-11 MED ORDER — CYCLOBENZAPRINE HCL 10 MG PO TABS
ORAL_TABLET | ORAL | 0 refills | Status: DC
  Filled 2020-11-16: qty 90, 30d supply, fill #0

## 2020-11-11 MED ORDER — PHENTERMINE HCL 37.5 MG PO TABS
ORAL_TABLET | ORAL | 0 refills | Status: DC
  Filled 2020-11-16: qty 30, 30d supply, fill #0

## 2020-11-11 MED ORDER — OXYCODONE-ACETAMINOPHEN 10-325 MG PO TABS
ORAL_TABLET | ORAL | 0 refills | Status: DC
  Filled 2020-11-16: qty 150, 30d supply, fill #0

## 2020-11-11 MED ORDER — TOPIRAMATE 50 MG PO TABS: 50.0000 mg | ORAL_TABLET | Freq: Every day | ORAL | 0 refills | Status: DC

## 2020-11-14 ENCOUNTER — Other Ambulatory Visit (HOSPITAL_COMMUNITY): Payer: Self-pay

## 2020-11-16 ENCOUNTER — Other Ambulatory Visit (HOSPITAL_COMMUNITY): Payer: Self-pay

## 2020-11-17 ENCOUNTER — Other Ambulatory Visit (HOSPITAL_COMMUNITY): Payer: Self-pay

## 2020-11-17 MED FILL — Semaglutide Soln Pen-inj 1 MG/DOSE (4 MG/3ML): SUBCUTANEOUS | 84 days supply | Qty: 9 | Fill #0 | Status: AC

## 2020-12-13 ENCOUNTER — Other Ambulatory Visit (HOSPITAL_COMMUNITY): Payer: Self-pay

## 2020-12-13 MED ORDER — TOPIRAMATE 50 MG PO TABS
50.0000 mg | ORAL_TABLET | Freq: Every day | ORAL | 0 refills | Status: DC
  Filled 2020-12-13: qty 30, 30d supply, fill #0

## 2020-12-13 MED ORDER — OXYCODONE-ACETAMINOPHEN 10-325 MG PO TABS
ORAL_TABLET | ORAL | 0 refills | Status: DC
  Filled 2020-12-13: qty 150, fill #0
  Filled 2020-12-14: qty 150, 30d supply, fill #0

## 2020-12-13 MED ORDER — CYCLOBENZAPRINE HCL 10 MG PO TABS
ORAL_TABLET | ORAL | 0 refills | Status: DC
  Filled 2020-12-13: qty 90, 30d supply, fill #0

## 2020-12-13 MED ORDER — NAPROXEN SODIUM ER 375 MG PO TB24: 375.0000 mg | ORAL_TABLET | Freq: Every day | ORAL | 1 refills | Status: DC

## 2020-12-13 MED ORDER — PHENTERMINE HCL 37.5 MG PO TABS
ORAL_TABLET | ORAL | 0 refills | Status: DC
  Filled 2020-12-13: qty 30, fill #0
  Filled 2020-12-14: qty 30, 30d supply, fill #0

## 2020-12-13 MED ORDER — VITAMIN D (ERGOCALCIFEROL) 1.25 MG (50000 UNIT) PO CAPS
ORAL_CAPSULE | ORAL | 1 refills | Status: DC
  Filled 2020-12-13: qty 12, 90d supply, fill #0

## 2020-12-13 MED ORDER — OZEMPIC (1 MG/DOSE) 4 MG/3ML ~~LOC~~ SOPN
PEN_INJECTOR | SUBCUTANEOUS | 1 refills | Status: DC
  Filled 2020-12-13: qty 9, 84d supply, fill #0

## 2020-12-14 ENCOUNTER — Other Ambulatory Visit (HOSPITAL_COMMUNITY): Payer: Self-pay

## 2021-01-10 ENCOUNTER — Other Ambulatory Visit (HOSPITAL_COMMUNITY): Payer: Self-pay

## 2021-01-10 MED ORDER — PHENTERMINE HCL 37.5 MG PO TABS
37.5000 mg | ORAL_TABLET | Freq: Every day | ORAL | 0 refills | Status: DC
  Filled 2021-01-10: qty 30, 30d supply, fill #0

## 2021-01-10 MED ORDER — METOPROLOL SUCCINATE ER 25 MG PO TB24
25.0000 mg | ORAL_TABLET | Freq: Every day | ORAL | 1 refills | Status: DC
  Filled 2021-01-10: qty 90, 90d supply, fill #0

## 2021-01-10 MED ORDER — CYCLOBENZAPRINE HCL 10 MG PO TABS
ORAL_TABLET | ORAL | 0 refills | Status: DC
  Filled 2021-01-10: qty 90, 30d supply, fill #0

## 2021-01-10 MED ORDER — OXYCODONE-ACETAMINOPHEN 10-325 MG PO TABS
ORAL_TABLET | ORAL | 0 refills | Status: DC
  Filled 2021-01-17: qty 150, 30d supply, fill #0

## 2021-01-10 MED ORDER — NAPROXEN SODIUM ER 375 MG PO TB24
ORAL_TABLET | ORAL | 1 refills | Status: DC
  Filled 2021-01-10 – 2021-04-14 (×2): qty 90, 90d supply, fill #0

## 2021-01-10 MED ORDER — LISINOPRIL 10 MG PO TABS
ORAL_TABLET | ORAL | 1 refills | Status: DC
  Filled 2021-01-10: qty 90, 90d supply, fill #0

## 2021-01-10 MED ORDER — TOPIRAMATE 50 MG PO TABS
50.0000 mg | ORAL_TABLET | Freq: Every day | ORAL | 0 refills | Status: DC
  Filled 2021-01-10: qty 30, 30d supply, fill #0

## 2021-01-17 ENCOUNTER — Other Ambulatory Visit (HOSPITAL_COMMUNITY): Payer: Self-pay

## 2021-01-25 ENCOUNTER — Other Ambulatory Visit (HOSPITAL_COMMUNITY): Payer: Self-pay

## 2021-01-25 MED ORDER — LISINOPRIL 10 MG PO TABS
ORAL_TABLET | ORAL | 1 refills | Status: DC
  Filled 2021-01-25: qty 90, 90d supply, fill #0

## 2021-02-09 ENCOUNTER — Other Ambulatory Visit (HOSPITAL_COMMUNITY): Payer: Self-pay

## 2021-02-09 MED ORDER — PHENTERMINE HCL 37.5 MG PO TABS
ORAL_TABLET | ORAL | 0 refills | Status: DC
  Filled 2021-02-09: qty 30, 30d supply, fill #0

## 2021-02-09 MED ORDER — TOPIRAMATE 50 MG PO TABS
50.0000 mg | ORAL_TABLET | Freq: Every day | ORAL | 0 refills | Status: DC
  Filled 2021-02-09: qty 30, 30d supply, fill #0

## 2021-02-09 MED ORDER — OXYCODONE-ACETAMINOPHEN 10-325 MG PO TABS
ORAL_TABLET | ORAL | 0 refills | Status: DC
  Filled 2021-02-09 – 2021-02-14 (×2): qty 150, 30d supply, fill #0

## 2021-02-09 MED ORDER — METFORMIN HCL 500 MG PO TABS
500.0000 mg | ORAL_TABLET | Freq: Every day | ORAL | 3 refills | Status: DC
  Filled 2021-02-09: qty 30, 30d supply, fill #0

## 2021-02-09 MED ORDER — OZEMPIC (1 MG/DOSE) 4 MG/3ML ~~LOC~~ SOPN
PEN_INJECTOR | SUBCUTANEOUS | 1 refills | Status: DC
  Filled 2021-02-09: qty 3, 28d supply, fill #0

## 2021-02-09 MED ORDER — CYCLOBENZAPRINE HCL 10 MG PO TABS
ORAL_TABLET | ORAL | 0 refills | Status: DC
  Filled 2021-02-09: qty 90, 30d supply, fill #0

## 2021-02-09 MED ORDER — VITAMIN D (ERGOCALCIFEROL) 1.25 MG (50000 UNIT) PO CAPS
ORAL_CAPSULE | ORAL | 1 refills | Status: DC
  Filled 2021-02-09: qty 12, 84d supply, fill #0

## 2021-02-11 ENCOUNTER — Other Ambulatory Visit (HOSPITAL_COMMUNITY): Payer: Self-pay

## 2021-02-14 ENCOUNTER — Other Ambulatory Visit (HOSPITAL_COMMUNITY): Payer: Self-pay

## 2021-02-23 ENCOUNTER — Other Ambulatory Visit (HOSPITAL_COMMUNITY): Payer: Self-pay

## 2021-02-27 ENCOUNTER — Other Ambulatory Visit (HOSPITAL_COMMUNITY): Payer: Self-pay

## 2021-03-13 ENCOUNTER — Other Ambulatory Visit (HOSPITAL_COMMUNITY): Payer: Self-pay

## 2021-03-13 MED ORDER — OZEMPIC (1 MG/DOSE) 4 MG/3ML ~~LOC~~ SOPN
PEN_INJECTOR | SUBCUTANEOUS | 1 refills | Status: DC
  Filled 2021-03-13: qty 3, 28d supply, fill #0
  Filled 2021-04-14: qty 3, 28d supply, fill #1
  Filled 2021-05-18: qty 3, 28d supply, fill #2
  Filled 2021-06-13: qty 3, 28d supply, fill #3
  Filled 2021-07-14: qty 3, 28d supply, fill #4
  Filled 2021-09-04: qty 3, 28d supply, fill #5

## 2021-03-13 MED ORDER — OXYCODONE-ACETAMINOPHEN 10-325 MG PO TABS
ORAL_TABLET | ORAL | 0 refills | Status: DC
  Filled 2021-03-14: qty 150, 30d supply, fill #0

## 2021-03-13 MED ORDER — TOPIRAMATE 50 MG PO TABS
50.0000 mg | ORAL_TABLET | Freq: Every day | ORAL | 1 refills | Status: DC
  Filled 2021-03-13: qty 30, 30d supply, fill #0

## 2021-03-13 MED ORDER — PHENTERMINE HCL 37.5 MG PO TABS
37.5000 mg | ORAL_TABLET | Freq: Every day | ORAL | 1 refills | Status: DC
  Filled 2021-03-13: qty 30, 30d supply, fill #0
  Filled 2021-04-14: qty 30, 30d supply, fill #1

## 2021-03-13 MED ORDER — CYCLOBENZAPRINE HCL 10 MG PO TABS
ORAL_TABLET | ORAL | 0 refills | Status: DC
  Filled 2021-03-13: qty 90, 30d supply, fill #0

## 2021-03-14 ENCOUNTER — Other Ambulatory Visit (HOSPITAL_COMMUNITY): Payer: Self-pay

## 2021-04-13 ENCOUNTER — Other Ambulatory Visit (HOSPITAL_COMMUNITY): Payer: Self-pay

## 2021-04-13 MED ORDER — OXYCODONE-ACETAMINOPHEN 10-325 MG PO TABS
ORAL_TABLET | ORAL | 0 refills | Status: DC
  Filled 2021-04-13: qty 150, 30d supply, fill #0

## 2021-04-13 MED ORDER — CYCLOBENZAPRINE HCL 10 MG PO TABS
ORAL_TABLET | ORAL | 0 refills | Status: DC
  Filled 2021-04-13: qty 90, 30d supply, fill #0

## 2021-04-14 ENCOUNTER — Other Ambulatory Visit: Payer: Self-pay

## 2021-04-14 ENCOUNTER — Emergency Department (HOSPITAL_COMMUNITY)
Admission: EM | Admit: 2021-04-14 | Discharge: 2021-04-14 | Disposition: A | Discharge status: Home / Self Care | Payer: BC Managed Care – PPO | Attending: Emergency Medicine | Admitting: Emergency Medicine

## 2021-04-14 ENCOUNTER — Other Ambulatory Visit (HOSPITAL_COMMUNITY): Payer: Self-pay

## 2021-04-14 ENCOUNTER — Encounter (HOSPITAL_COMMUNITY): Payer: Self-pay | Admitting: Emergency Medicine

## 2021-04-14 DIAGNOSIS — M5459 Other low back pain: Secondary | ICD-10-CM | POA: Insufficient documentation

## 2021-04-14 DIAGNOSIS — M545 Low back pain, unspecified: Secondary | ICD-10-CM

## 2021-04-14 DIAGNOSIS — Z87891 Personal history of nicotine dependence: Secondary | ICD-10-CM | POA: Insufficient documentation

## 2021-04-14 DIAGNOSIS — Z7982 Long term (current) use of aspirin: Secondary | ICD-10-CM | POA: Diagnosis not present

## 2021-04-14 MED ORDER — HYDROMORPHONE HCL 1 MG/ML IJ SOLN
1.0000 mg | Freq: Once | INTRAMUSCULAR | Status: AC
  Administered 2021-04-14: 1 mg via INTRAMUSCULAR
  Filled 2021-04-14: qty 1

## 2021-04-14 MED ORDER — LIDOCAINE 5 % EX PTCH
1.0000 | MEDICATED_PATCH | CUTANEOUS | Status: DC
  Administered 2021-04-14: 1 via TRANSDERMAL
  Filled 2021-04-14: qty 1

## 2021-04-14 MED ORDER — LIDOCAINE 5 % EX PTCH
1.0000 | MEDICATED_PATCH | CUTANEOUS | 0 refills | Status: DC
  Filled 2021-04-14 (×2): qty 30, 30d supply, fill #0

## 2021-04-14 MED ORDER — NAPROXEN 375 MG PO TABS
375.0000 mg | ORAL_TABLET | Freq: Two times a day (BID) | ORAL | 0 refills | Status: DC
  Filled 2021-04-14: qty 20, 10d supply, fill #0

## 2021-04-14 MED ORDER — ONDANSETRON 8 MG PO TBDP
8.0000 mg | ORAL_TABLET | Freq: Once | ORAL | Status: AC
  Administered 2021-04-14: 8 mg via ORAL
  Filled 2021-04-14: qty 1

## 2021-04-14 NOTE — ED Provider Notes (Signed)
[facility] DEPT Provider Note   CSN: [id number] Arrival date & time: 04/14/21  1308     History Chief Complaint  Patient presents with   Back Pain    Olivia Schmidt is a 60 y.o. female.   Back Pain Associated symptoms: no dysuria and no fever    Patient presents with right-sided back pain.  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  She reports she twisted and felt pain in her right back that has been intermittent since then.  The pain is exacerbated by movement, she has not tried any pain medicine at home.  She did try taking some muscle relaxers at home which did not alleviate her symptoms.  Sitting still seems to be an alleviating factor.  No prior history of back surgeries, no history of malignancy, no history of IV drug use.  Denies any urinary retention, saddle anesthesia, bilateral leg numbness.  She is ambulatory but having difficulty walking secondary to pain.  Denies any urinary symptoms such as hematuria.  No abdominal pain, nausea, vomiting.  Past Medical History:  Diagnosis Date   Acute bronchitis 05/08/2015   Anemia 05/07/2015   Atrial fibrillation with rapid ventricular response ([place])    Chest pain 05/07/2015   FEMALE INFERTILITY 10/21/2008   Qualifier: Diagnosis of  By: Amil Amen MD, [place]     Fibroids    FIBROIDS, UTERUS 07/11/2009   Qualifier: Diagnosis of  By: Amil Amen MD, Caleen [place]    Hyperthyroidism 05/07/2015   Hypokalemia 05/08/2015   Hypomagnesemia 05/08/2015   PAF (paroxysmal atrial fibrillation) ([place]) 05/09/2015   Thrombocytopenia ([place]) 05/09/2015    Patient Active Problem List   Diagnosis Date Noted   Graves disease 03/19/2017   Morbid obesity ([place]) 01/28/2017   Myalgia 02/14/2016   Goiter    PAF (paroxysmal atrial fibrillation) ([place]) 05/09/2015   Thrombocytopenia ([place]) 05/09/2015   Atrial fibrillation with rapid ventricular response ([facility])    Acute bronchitis  05/08/2015   Hypokalemia 05/08/2015   Hypomagnesemia 05/08/2015   Emesis    Atrial fibrillation with RVR ([place]) 05/07/2015   Chest pain 05/07/2015   Anemia 05/07/2015   FIBROIDS, UTERUS 07/11/2009   PELVIC PAIN, RIGHT 06/30/2009   FEMALE INFERTILITY 10/21/2008   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    Past Surgical History:  Procedure Laterality Date   ABDOMINAL HYSTERECTOMY     I & D EXTREMITY Right 10/27/2015   Procedure: IRRIGATION AND DEBRIDEMENT RIGHT SMALL FINGER;  Surgeon: Leanora Cover, MD;  Location: [facility];  Service: Orthopedics;  Laterality: Right;  With local block     OB History     Gravida  1   Para      Term      Preterm      AB  1   Living         SAB      IAB      Ectopic      Multiple      Live Births              Family History  Problem Relation Age of Onset   Aneurysm Father        brain   Hypertension Sister    Hypertension Brother    Thyroid disease Neg Hx    Colon cancer Neg Hx    Esophageal cancer Neg Hx    Rectal cancer Neg Hx    Stomach cancer Neg  Hx     Social History   Tobacco Use   Smoking status: Former    Years: 24.00    Types: Cigarettes    Quit date: 06/12/2003    Years since quitting: 17.8   Smokeless tobacco: Never  Substance Use Topics   Alcohol use: No   Drug use: No    Home Medications Prior to Admission medications   Medication Sig Start Date End Date Taking? Authorizing Provider  acetaminophen (TYLENOL 8 HOUR) 650 MG CR tablet Take 1 tablet (650 mg total) by mouth every 8 (eight) hours as needed. 10/01/17   Varney Biles, MD  aspirin EC 81 MG EC tablet Take 1 tablet (81 mg total) by mouth daily. 05/11/15   Eugenie Filler, MD  cyclobenzaprine (FLEXERIL) 10 MG tablet Take 1 tablet (10 mg total) by mouth 2 (two) times daily as needed for muscle spasms. 01/28/17   Charlott Rakes, MD  cyclobenzaprine (FLEXERIL) 10 MG tablet TAKE 1 TABLET BY MOUTH 3 TIMES A DAY AS NEEDED FOR MUSCLE SPASMS 08/17/20  08/17/21  [name], NP  cyclobenzaprine (FLEXERIL) 10 MG tablet TAKE 1 TABLET BY MOUTH 3 TIMES A DAY AS NEEDED FOR MUSCLE SPASMS 07/20/20 07/20/21  [name], NP  cyclobenzaprine (FLEXERIL) 10 MG tablet TAKE 1 TABLET BY MOUTH 3 TIMES A DAY AS NEEDED FOR MUSCLE SPASMS 06/22/20 06/22/21  [name], NP  cyclobenzaprine (FLEXERIL) 10 MG tablet TAKE 1 TABLET BY MOUTH 3 TIMES A DAY AS NEEDED FOR MUSCLE SPASMS 05/25/20 05/25/21  [name], NP  cyclobenzaprine (FLEXERIL) 10 MG tablet Take 1 tablet by mouth 3 times a day as needed for muscle spasms 04/13/21     ibuprofen (ADVIL,MOTRIN) 600 MG tablet Take 1 tablet (600 mg total) by mouth every 6 (six) hours as needed. 10/01/17   Varney Biles, MD  lisinopril (ZESTRIL) 10 MG tablet TAKE 1 (ONE) TABLET BY MOUTH IN THE EVENING 08/17/20 08/17/21  [name], NP  lisinopril (ZESTRIL) 10 MG tablet TAKE 1 (ONE) TABLET BY MOUTH IN THE EVENING 07/20/20 07/20/21  [name], NP  lisinopril (ZESTRIL) 10 MG tablet Take 1 (one) Tablet by mouth in evening 09/16/20     lisinopril (ZESTRIL) 10 MG tablet Take 1 (one) Tablet Tablet by mouth in the evening 10/12/20     lisinopril (ZESTRIL) 10 MG tablet Take 1 tablet by mouth daily in evening 01/10/21     lisinopril (ZESTRIL) 10 MG tablet Take 1 (one) Tablet by mouth every evening 01/25/21     Magnesium 250 MG TABS Take 1 tablet by mouth at bedtime 09/16/20     metFORMIN (GLUCOPHAGE) 500 MG tablet Take 1 tablet by mouth daily 02/09/21     metoprolol succinate (TOPROL-XL) 25 MG 24 hr tablet TAKE 1 (ONE) TABLET BY MOUTH DAILY FOR HYPERTENSION 07/20/20 07/20/21  [name], NP  metoprolol succinate (TOPROL-XL) 25 MG 24 hr tablet TAKE 1 (ONE) TABLET BY MOUTH DAILY FOR HYPERTENSION 06/22/20 06/22/21  [name], NP  metoprolol succinate (TOPROL-XL) 25 MG 24 hr tablet Take 1 (one) Tablet by mouth daily for hypertension 10/12/20     metoprolol succinate (TOPROL-XL) 25 MG 24 hr tablet Take 1 tablet (25 mg total) by mouth daily for  hypertension 01/10/21     Naproxen Sodium 375 MG TB24 TAKE 1 (ONE) TABLET BY MOUTH DAILY 08/17/20 08/17/21  [name], NP  Naproxen Sodium 375 MG TB24 TAKE 1 (ONE) TABLET BY MOUTH DAILY 07/20/20 07/20/21  [name], NP  Naproxen Sodium 375 MG  TB24 TAKE 1 (ONE) TABLET BY MOUTH DAILY 06/22/20 06/22/21  [name], NP  Naproxen Sodium 375 MG TB24 Take 1 tablet (375 mg total) by mouth daily. 09/16/20     Naproxen Sodium 375 MG TB24 Take 1 tablet (375 mg total) by mouth daily. 12/13/20     Naproxen Sodium 375 MG TB24 Take 1 tablet by mouth daily 01/10/21     oxyCODONE-acetaminophen (PERCOCET) 10-325 MG tablet Take 1 (one) tablet  by mouth 5 times daily, as needed for pain 04/13/21     phentermine (ADIPEX-P) 37.5 MG tablet TAKE 1 TABLET BY MOUTH DAILY 08/17/20 02/13/21  [name], NP  phentermine (ADIPEX-P) 37.5 MG tablet TAKE 1 TABLET BY MOUTH DAILY 07/20/20 01/16/21  [name], NP  phentermine (ADIPEX-P) 37.5 MG tablet TAKE 1 TABLET BY MOUTH DAILY 06/22/20 12/19/20  [name], NP  phentermine (ADIPEX-P) 37.5 MG tablet Take 1 tablet by mouth daily 03/13/21     Semaglutide, 1 MG/DOSE, (OZEMPIC, 1 MG/DOSE,) 4 MG/3ML SOPN Inject 1 mg under skin weekly 12/13/20     Semaglutide, 1 MG/DOSE, (OZEMPIC, 1 MG/DOSE,) 4 MG/3ML SOPN Inject 1 (one) Milligram under the skin weekly as directed 02/09/21     Semaglutide, 1 MG/DOSE, (OZEMPIC, 1 MG/DOSE,) 4 MG/3ML SOPN Inject 1 mg under the skin once a week 03/13/21     Semaglutide, 1 MG/DOSE, 4 MG/3ML SOPN INJECT 1 (ONE) MILLIGRAM UNDER SKIN WEEKLY 08/17/20 08/17/21  [name], NP  Semaglutide, 1 MG/DOSE, 4 MG/3ML SOPN INJECT 1 (ONE) MILLIGRAM UNDER SKIN WEEKLY 06/22/20 06/22/21  [name], NP  topiramate (TOPAMAX) 50 MG tablet Take 1 tablet by mouth daily 11/11/20     topiramate (TOPAMAX) 50 MG tablet Take 1 tablet by mouth daily 12/13/20     topiramate (TOPAMAX) 50 MG tablet Take 1 tablet by mouth daily 01/10/21     topiramate (TOPAMAX) 50 MG tablet Take 1 tablet by mouth daily  02/09/21     topiramate (TOPAMAX) 50 MG tablet Take 1 tablet by mouth daily 03/13/21     Vitamin D, Ergocalciferol, (DRISDOL) 1.25 MG (50000 UNIT) CAPS capsule TAKE 1 (ONE) CAPSULE BY MOUTH WEEKLY 08/17/20 08/17/21  [name], NP  Vitamin D, Ergocalciferol, (DRISDOL) 1.25 MG (50000 UNIT) CAPS capsule TAKE 1 (ONE) CAPSULE CAPSULE BY MOUTH WEEKLY 07/20/20 07/20/21  [name], NP  Vitamin D, Ergocalciferol, (DRISDOL) 1.25 MG (50000 UNIT) CAPS capsule TAKE 1 (ONE) CAPSULE BY MOUTH WEEKLY 06/22/20 06/22/21  [name], NP  Vitamin D, Ergocalciferol, (DRISDOL) 1.25 MG (50000 UNIT) CAPS capsule Take 1 (one) Capsule Capsule by mouth weekly 09/16/20     Vitamin D, Ergocalciferol, (DRISDOL) 1.25 MG (50000 UNIT) CAPS capsule Take 1 (one) Capsule by mouth weekly 10/12/20     Vitamin D, Ergocalciferol, (DRISDOL) 1.25 MG (50000 UNIT) CAPS capsule Take 1 capsule by mouth weekly 12/13/20     Vitamin D, Ergocalciferol, (DRISDOL) 1.25 MG (50000 UNIT) CAPS capsule Take 1 (one) Capsule by mouth weekly 02/09/21       Allergies    Patient has no known allergies.  Review of Systems   Review of Systems  Constitutional:  Negative for fever.  Eyes:  Negative for visual disturbance.  Genitourinary:  Negative for decreased urine volume, dysuria, flank pain, frequency and hematuria.       No urinary retention   Musculoskeletal:  Positive for back pain.  Skin:  Negative for rash.  Allergic/Immunologic: Negative for immunocompromised state.  Neurological:  Negative for dizziness and syncope.  No saddle anesthesia, no bilateral leg numbness    Physical Exam Updated Vital Signs BP (!) 179/80 (BP Location: Left Arm)   Pulse 72   Temp 97.9 F (36.6 C) (Oral)   Resp 20   Ht 5\' 4"  (1.626 m)   Wt 104.3 kg   LMP 09/14/2010   SpO2 100%   BMI 39.48 kg/m   Physical Exam Vitals and nursing note reviewed. Exam conducted with a chaperone present.  Constitutional:      Appearance: Normal appearance.     Comments: Patient  ambulatory  HENT:     Head: Normocephalic.  Eyes:     Extraocular Movements: Extraocular movements intact.     Pupils: Pupils are equal, round, and reactive to light.     Comments: No nystagmus   Neck:     Comments: No midline cervical tenderness. No palpable deformities.  Cardiovascular:     Rate and Rhythm: Normal rate and regular rhythm.     Pulses: Normal pulses.     Comments: DP, PT, and radial pulses 2+ and symmetrical bilaterally Pulmonary:     Effort: Pulmonary effort is normal.     Breath sounds: Normal breath sounds.  Abdominal:     Tenderness: There is no abdominal tenderness.  Musculoskeletal:        General: Tenderness present. Normal range of motion.     Cervical back: Normal range of motion. No rigidity or tenderness.     Comments: No midline tenderness, pain to the right lower back.  Skin:    General: Skin is warm and dry.     Capillary Refill: Capillary refill takes less than 2 seconds.     Findings: No bruising or erythema.  Neurological:     Mental Status: She is alert and oriented to person, place, and time. Mental status is at baseline.     Comments: Patient is alert, oriented to personal, place and time with normal speech. Cranial nerves III-XII grossly in tact. Grip strength equal bilaterally LE strength equal bilaterally. Sensation to light touch in tact bilaterally. No gait abnormalities, patient ambulatory.    Psychiatric:        Mood and Affect: Mood normal.    ED Results / Procedures / Treatments   Labs (all labs ordered are listed, but only abnormal results are displayed) Labs Reviewed - No data to display  EKG None  Radiology No results found.  Procedures Procedures   Medications Ordered in ED Medications  lidocaine (LIDODERM) 5 % 1 patch (has no administration in time range)  HYDROmorphone (DILAUDID) injection 1 mg (has no administration in time range)    ED Course  I have reviewed the triage vital signs and the nursing  notes.  Pertinent labs & imaging results that were available during my care of the patient were reviewed by me and considered in my medical decision making (see chart for details).    MDM Rules/Calculators/A&P                           Back pain most consistent with musculoskeletal spasm/strain.  Patient is neurologically intact, no focal deficits on exam. Patient is ambulatory.   No red flag symptoms - history of malignancy, prior spinal surgeries, IV drug use, severe trauma, neurologic deficits, fevers, prolonged corticosteroid use.  Doubt cauda equina - no bladder or bowel retention/incontinence, bilateral leg numbness or weakness, saddle anesthesia. Doubt osteomyelitis or epidural abscess - no history of IVDU, fever, vertebral  tenderness Doubt pyelonephritis - no urinary complaints, reproducible.  Doubt dissection - equal peripheral pulses, no tachycardia, story not consistent    Final Clinical Impression(s) / ED Diagnoses Final diagnoses:  None    Rx / DC Orders ED Discharge Orders     None        Sherrill Raring, PA-C 04/14/21 1456    Lacretia Leigh, MD 04/15/21 [phone]

## 2021-04-14 NOTE — Discharge Instructions (Addendum)
Take naproxen twice daily with food and water as needed for back pain for the next week.  You can also place a Lidoderm patches on your back as needed to help with the pain.  Follow-up with your primary care doctor in the next few weeks if no improvement.

## 2021-04-14 NOTE — ED Triage Notes (Signed)
Reports that her R lower back is having a spasm, shooting pains into her back, lifted some boxes yesterday. Took muscle relaxer's w/o relief. Denies numbness, weakness, incontinence, ambulatory w/ pain.

## 2021-04-15 ENCOUNTER — Other Ambulatory Visit (HOSPITAL_COMMUNITY): Payer: Self-pay

## 2021-04-17 ENCOUNTER — Telehealth (HOSPITAL_BASED_OUTPATIENT_CLINIC_OR_DEPARTMENT_OTHER): Payer: Self-pay | Admitting: Emergency Medicine

## 2021-04-17 ENCOUNTER — Other Ambulatory Visit (HOSPITAL_COMMUNITY): Payer: Self-pay

## 2021-04-17 ENCOUNTER — Encounter (HOSPITAL_BASED_OUTPATIENT_CLINIC_OR_DEPARTMENT_OTHER): Payer: Self-pay | Admitting: *Deleted

## 2021-04-17 ENCOUNTER — Emergency Department (HOSPITAL_BASED_OUTPATIENT_CLINIC_OR_DEPARTMENT_OTHER): Payer: BC Managed Care – PPO

## 2021-04-17 ENCOUNTER — Emergency Department (HOSPITAL_BASED_OUTPATIENT_CLINIC_OR_DEPARTMENT_OTHER)
Admission: EM | Admit: 2021-04-17 | Discharge: 2021-04-17 | Disposition: A | Discharge status: Home / Self Care | Payer: BC Managed Care – PPO | Attending: Emergency Medicine | Admitting: Emergency Medicine

## 2021-04-17 ENCOUNTER — Other Ambulatory Visit: Payer: Self-pay

## 2021-04-17 DIAGNOSIS — M545 Low back pain, unspecified: Secondary | ICD-10-CM | POA: Insufficient documentation

## 2021-04-17 DIAGNOSIS — Z7984 Long term (current) use of oral hypoglycemic drugs: Secondary | ICD-10-CM | POA: Diagnosis not present

## 2021-04-17 DIAGNOSIS — Z7982 Long term (current) use of aspirin: Secondary | ICD-10-CM | POA: Insufficient documentation

## 2021-04-17 DIAGNOSIS — Z87891 Personal history of nicotine dependence: Secondary | ICD-10-CM | POA: Diagnosis not present

## 2021-04-17 DIAGNOSIS — Z79899 Other long term (current) drug therapy: Secondary | ICD-10-CM | POA: Diagnosis not present

## 2021-04-17 MED ORDER — LIDOCAINE 5 % EX PTCH
1.0000 | MEDICATED_PATCH | CUTANEOUS | Status: DC
  Administered 2021-04-17: 1 via TRANSDERMAL
  Filled 2021-04-17: qty 1

## 2021-04-17 MED ORDER — PREDNISONE 10 MG (21) PO TBPK: ORAL_TABLET | Freq: Every day | ORAL | 0 refills | Status: DC

## 2021-04-17 MED ORDER — OXYCODONE-ACETAMINOPHEN 5-325 MG PO TABS
2.0000 | ORAL_TABLET | Freq: Once | ORAL | Status: AC
Start: 2021-04-17 — End: 2021-04-17
  Administered 2021-04-17: 2 via ORAL
  Filled 2021-04-17: qty 2

## 2021-04-17 NOTE — ED Triage Notes (Signed)
Nontraumatic back pain mid back to lower back. ? Lifted boxes at work. Pt seen yesterday at [facility] for same. Has been taking Ibuprofen and Muscle relaxer's without relief.

## 2021-04-17 NOTE — Telephone Encounter (Signed)
Pt called and wanted different pharmacy for dc meds, this was sent electronically per pt request. I did not speak with the pt. This was relayed to me by ED secretary.

## 2021-04-17 NOTE — ED Provider Notes (Signed)
[place] EMERGENCY DEPT Provider Note   CSN: [id number] Arrival date & time: 04/17/21  1158     History Chief Complaint  Patient presents with   Back Pain    Olivia Schmidt is a 60 y.o. female with a PMH significant for chronic back pain who reports for concern about continuing lower back pain that has not improved despite treatment for the last week. Patient reports she was lifting some boxes at work when pain began. Patient seen initially at [facility], prescribed muscle relaxant. No improvement of pain with muscle relaxant, ibuprofen. Patient denies numbness, tingling, weakness, saddle anesthesia, urinary retention, difficulty with defecation.  Patient reports the pain is a 7/10 at rest, 10/10 with movement.  Denies history of IV drug use, cancer, chronic corticosteroid use, recent fever.   Back Pain Associated symptoms: no numbness and no weakness       Past Medical History:  Diagnosis Date   Acute bronchitis 05/08/2015   Anemia 05/07/2015   Atrial fibrillation with rapid ventricular response ([place])    Chest pain 05/07/2015   FEMALE INFERTILITY 10/21/2008   Qualifier: Diagnosis of  By: Amil Amen MD, Elizabeth     Fibroids    FIBROIDS, UTERUS 07/11/2009   Qualifier: Diagnosis of  By: Amil Amen MD, Caleen Essex    Hyperthyroidism 05/07/2015   Hypokalemia 05/08/2015   Hypomagnesemia 05/08/2015   PAF (paroxysmal atrial fibrillation) ([place]) 05/09/2015   Thrombocytopenia ([place]) 05/09/2015    Patient Active Problem List   Diagnosis Date Noted   Graves disease 03/19/2017   Morbid obesity ([place]) 01/28/2017   Myalgia 02/14/2016   Goiter    PAF (paroxysmal atrial fibrillation) ([place]) 05/09/2015   Thrombocytopenia ([facility]) 05/09/2015   Atrial fibrillation with rapid ventricular response ([facility])    Acute bronchitis 05/08/2015   Hypokalemia 05/08/2015   Hypomagnesemia 05/08/2015   Emesis    Atrial fibrillation with RVR ([place]) 05/07/2015   Chest pain 05/07/2015    Anemia 05/07/2015   FIBROIDS, UTERUS 07/11/2009   PELVIC PAIN, RIGHT 06/30/2009   FEMALE INFERTILITY 10/21/2008   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    Past Surgical History:  Procedure Laterality Date   ABDOMINAL HYSTERECTOMY     I & D EXTREMITY Right 10/27/2015   Procedure: IRRIGATION AND DEBRIDEMENT RIGHT SMALL FINGER;  Surgeon: Leanora Cover, MD;  Location: [facility]S;  Service: Orthopedics;  Laterality: Right;  With local block     OB History     Gravida  1   Para      Term      Preterm      AB  1   Living         SAB      IAB      Ectopic      Multiple      Live Births              Family History  Problem Relation Age of Onset   Aneurysm Father        brain   Hypertension Sister    Hypertension Brother    Thyroid disease Neg Hx    Colon cancer Neg Hx    Esophageal cancer Neg Hx    Rectal cancer Neg Hx    Stomach cancer Neg Hx     Social History   Tobacco Use   Smoking status: Former    Years: 24.00    Types: Cigarettes    Quit date: 06/12/2003    Years since quitting:  17.8   Smokeless tobacco: Never  Vaping Use   Vaping Use: Never used  Substance Use Topics   Alcohol use: No   Drug use: No    Home Medications Prior to Admission medications   Medication Sig Start Date End Date Taking? Authorizing Provider  predniSONE (STERAPRED UNI-PAK 21 TAB) 10 MG (21) TBPK tablet Take by mouth daily. Take 6 tabs by mouth daily  for 2 days, then 5 tabs for 2 days, then 4 tabs for 2 days, then 3 tabs for 2 days, 2 tabs for 2 days, then 1 tab by mouth daily for 2 days 04/17/21  Yes Dontavius Keim H, PA-C  acetaminophen (TYLENOL 8 HOUR) 650 MG CR tablet Take 1 tablet (650 mg total) by mouth every 8 (eight) hours as needed. 10/01/17   Varney Biles, MD  aspirin EC 81 MG EC tablet Take 1 tablet (81 mg total) by mouth daily. 05/11/15   Eugenie Filler, MD  cyclobenzaprine (FLEXERIL) 10 MG tablet Take 1 tablet (10 mg total) by mouth 2 (two) times  daily as needed for muscle spasms. 01/28/17   Charlott Rakes, MD  cyclobenzaprine (FLEXERIL) 10 MG tablet TAKE 1 TABLET BY MOUTH 3 TIMES A DAY AS NEEDED FOR MUSCLE SPASMS 08/17/20 08/17/21  [name], NP  cyclobenzaprine (FLEXERIL) 10 MG tablet TAKE 1 TABLET BY MOUTH 3 TIMES A DAY AS NEEDED FOR MUSCLE SPASMS 07/20/20 07/20/21  [name], NP  cyclobenzaprine (FLEXERIL) 10 MG tablet TAKE 1 TABLET BY MOUTH 3 TIMES A DAY AS NEEDED FOR MUSCLE SPASMS 06/22/20 06/22/21  [name], NP  cyclobenzaprine (FLEXERIL) 10 MG tablet TAKE 1 TABLET BY MOUTH 3 TIMES A DAY AS NEEDED FOR MUSCLE SPASMS 05/25/20 05/25/21  [name], NP  cyclobenzaprine (FLEXERIL) 10 MG tablet Take 1 tablet by mouth 3 times a day as needed for muscle spasms 04/13/21     ibuprofen (ADVIL,MOTRIN) 600 MG tablet Take 1 tablet (600 mg total) by mouth every 6 (six) hours as needed. 10/01/17   Varney Biles, MD  lidocaine (LIDODERM) 5 % Place 1 patch onto the skin daily. Remove & Discard patch within 12 hours or as directed by MD 04/14/21   Sherrill Raring, PA-C  lisinopril (ZESTRIL) 10 MG tablet TAKE 1 (ONE) TABLET BY MOUTH IN THE EVENING 08/17/20 08/17/21  [name], NP  lisinopril (ZESTRIL) 10 MG tablet TAKE 1 (ONE) TABLET BY MOUTH IN THE EVENING 07/20/20 07/20/21  [name], NP  lisinopril (ZESTRIL) 10 MG tablet Take 1 (one) Tablet by mouth in evening 09/16/20     lisinopril (ZESTRIL) 10 MG tablet Take 1 (one) Tablet Tablet by mouth in the evening 10/12/20     lisinopril (ZESTRIL) 10 MG tablet Take 1 tablet by mouth daily in evening 01/10/21     lisinopril (ZESTRIL) 10 MG tablet Take 1 (one) Tablet by mouth every evening 01/25/21     Magnesium 250 MG TABS Take 1 tablet by mouth at bedtime 09/16/20     metFORMIN (GLUCOPHAGE) 500 MG tablet Take 1 tablet by mouth daily 02/09/21     metoprolol succinate (TOPROL-XL) 25 MG 24 hr tablet TAKE 1 (ONE) TABLET BY MOUTH DAILY FOR HYPERTENSION 07/20/20 07/20/21  [name], NP  metoprolol succinate (TOPROL-XL) 25 MG  24 hr tablet TAKE 1 (ONE) TABLET BY MOUTH DAILY FOR HYPERTENSION 06/22/20 06/22/21  [name], NP  metoprolol succinate (TOPROL-XL) 25 MG 24 hr tablet Take 1 (one) Tablet by mouth daily for hypertension 10/12/20     metoprolol  succinate (TOPROL-XL) 25 MG 24 hr tablet Take 1 tablet (25 mg total) by mouth daily for hypertension 01/10/21     naproxen (NAPROSYN) 375 MG tablet Take 1 tablet (375 mg total) by mouth 2 (two) times daily. 04/14/21   Sherrill Raring, PA-C  Naproxen Sodium 375 MG TB24 TAKE 1 (ONE) TABLET BY MOUTH DAILY 08/17/20 08/17/21  [name], NP  Naproxen Sodium 375 MG TB24 TAKE 1 (ONE) TABLET BY MOUTH DAILY 07/20/20 07/20/21  [name], NP  Naproxen Sodium 375 MG TB24 TAKE 1 (ONE) TABLET BY MOUTH DAILY 06/22/20 06/22/21  [name], NP  Naproxen Sodium 375 MG TB24 Take 1 tablet (375 mg total) by mouth daily. 09/16/20     Naproxen Sodium 375 MG TB24 Take 1 tablet (375 mg total) by mouth daily. 12/13/20     Naproxen Sodium 375 MG TB24 Take 1 tablet by mouth daily. 01/10/21     oxyCODONE-acetaminophen (PERCOCET) 10-325 MG tablet Take 1 (one) tablet  by mouth 5 times daily, as needed for pain 04/13/21     phentermine (ADIPEX-P) 37.5 MG tablet TAKE 1 TABLET BY MOUTH DAILY 08/17/20 02/13/21  [name], NP  phentermine (ADIPEX-P) 37.5 MG tablet TAKE 1 TABLET BY MOUTH DAILY 07/20/20 01/16/21  [name], NP  phentermine (ADIPEX-P) 37.5 MG tablet TAKE 1 TABLET BY MOUTH DAILY 06/22/20 12/19/20  [name], NP  phentermine (ADIPEX-P) 37.5 MG tablet Take 1 tablet by mouth daily 03/13/21     Semaglutide, 1 MG/DOSE, (OZEMPIC, 1 MG/DOSE,) 4 MG/3ML SOPN Inject 1 mg under skin weekly 12/13/20     Semaglutide, 1 MG/DOSE, (OZEMPIC, 1 MG/DOSE,) 4 MG/3ML SOPN Inject 1 (one) Milligram under the skin weekly as directed 02/09/21     Semaglutide, 1 MG/DOSE, (OZEMPIC, 1 MG/DOSE,) 4 MG/3ML SOPN Inject 1 mg under the skin once a week 03/13/21     Semaglutide, 1 MG/DOSE, 4 MG/3ML SOPN INJECT 1 (ONE) MILLIGRAM UNDER SKIN WEEKLY  08/17/20 08/17/21  [name], NP  Semaglutide, 1 MG/DOSE, 4 MG/3ML SOPN INJECT 1 (ONE) MILLIGRAM UNDER SKIN WEEKLY 06/22/20 06/22/21  [name], NP  topiramate (TOPAMAX) 50 MG tablet Take 1 tablet by mouth daily 11/11/20     topiramate (TOPAMAX) 50 MG tablet Take 1 tablet by mouth daily 12/13/20     topiramate (TOPAMAX) 50 MG tablet Take 1 tablet by mouth daily 01/10/21     topiramate (TOPAMAX) 50 MG tablet Take 1 tablet by mouth daily 02/09/21     topiramate (TOPAMAX) 50 MG tablet Take 1 tablet by mouth daily 03/13/21     Vitamin D, Ergocalciferol, (DRISDOL) 1.25 MG (50000 UNIT) CAPS capsule TAKE 1 (ONE) CAPSULE BY MOUTH WEEKLY 08/17/20 08/17/21  [name], NP  Vitamin D, Ergocalciferol, (DRISDOL) 1.25 MG (50000 UNIT) CAPS capsule TAKE 1 (ONE) CAPSULE CAPSULE BY MOUTH WEEKLY 07/20/20 07/20/21  [name], NP  Vitamin D, Ergocalciferol, (DRISDOL) 1.25 MG (50000 UNIT) CAPS capsule TAKE 1 (ONE) CAPSULE BY MOUTH WEEKLY 06/22/20 06/22/21  [name], NP  Vitamin D, Ergocalciferol, (DRISDOL) 1.25 MG (50000 UNIT) CAPS capsule Take 1 (one) Capsule Capsule by mouth weekly 09/16/20     Vitamin D, Ergocalciferol, (DRISDOL) 1.25 MG (50000 UNIT) CAPS capsule Take 1 (one) Capsule by mouth weekly 10/12/20     Vitamin D, Ergocalciferol, (DRISDOL) 1.25 MG (50000 UNIT) CAPS capsule Take 1 capsule by mouth weekly 12/13/20     Vitamin D, Ergocalciferol, (DRISDOL) 1.25 MG (50000 UNIT) CAPS capsule Take 1 (one) Capsule by mouth weekly 02/09/21  Allergies    Patient has no known allergies.  Review of Systems   Review of Systems  Musculoskeletal:  Positive for back pain and gait problem.  Neurological:  Negative for weakness and numbness.  All other systems reviewed and are negative.  Physical Exam Updated Vital Signs BP (!) 178/85 (BP Location: Right Arm)   Pulse 69   Temp 98.3 F (36.8 C) (Oral)   Resp 16   Ht 5\' 4"  (1.626 m)   Wt 104.3 kg   LMP 09/14/2010   SpO2 100%   BMI 39.48 kg/m   Physical Exam Vitals  and nursing note reviewed.  Constitutional:      General: She is not in acute distress.    Appearance: Normal appearance.     Comments: Patient appears somewhat uncomfortable  HENT:     Head: Normocephalic and atraumatic.  Eyes:     General:        Right eye: No discharge.        Left eye: No discharge.  Cardiovascular:     Rate and Rhythm: Normal rate and regular rhythm.  Pulmonary:     Effort: Pulmonary effort is normal. No respiratory distress.  Musculoskeletal:        General: No deformity.     Comments: Significant tenderness palpation including midline lumbar spine, as well as lumbar paraspinous muscles.  Worse on left than right.  Intact flexion, extension of back. Intact strength 5/5 in BIL lower extremities. Intact sensation subjectively throughout. No deformity noted in midline spine.  Skin:    General: Skin is warm and dry.     Capillary Refill: Capillary refill takes less than 2 seconds.  Neurological:     Mental Status: She is alert and oriented to person, place, and time.  Psychiatric:        Mood and Affect: Mood normal.        Behavior: Behavior normal.    ED Results / Procedures / Treatments   Labs (all labs ordered are listed, but only abnormal results are displayed) Labs Reviewed - No data to display  EKG None  Radiology CT Lumbar Spine Wo Contrast  Result Date: 04/17/2021 CLINICAL DATA:  Low back pain lifted boxes EXAM: CT LUMBAR SPINE WITHOUT CONTRAST TECHNIQUE: Multidetector CT imaging of the lumbar spine was performed without intravenous contrast administration. Multiplanar CT image reconstructions were also generated. COMPARISON:  CT 07/06/2010 FINDINGS: Segmentation: 5 lumbar type vertebrae. Alignment: Normal. Vertebrae: No acute fracture or focal pathologic process. Paraspinal and other soft tissues: Negative.  Mild atherosclerosis. Disc levels: At T12-L1, maintained disc space. No canal stenosis. The foramen are patent bilaterally. At L1-L2,  maintained disc space. No canal stenosis. The foramen are patent bilaterally. At L2-L3, maintained disc space. Mild disc bulge. No canal stenosis. Mild facet degenerative changes. The foramen are patent bilaterally. At L3-L4, maintained disc space. Mild diffuse disc bulge. No canal stenosis. Hypertrophic facet degenerative changes. The foramen are patent bilaterally. At L4-L5, maintained disc space. Mild diffuse disc bulge. Ligamentum flavum thickening. Moderate hypertrophic facet degenerative changes with mild canal stenosis. No bony foraminal narrowing. At L5-S1, maintained disc space. Mild diffuse disc bulge without canal stenosis. Hypertrophic facet degenerative changes. Mild right foraminal narrowing. IMPRESSION: 1. No acute osseous abnormality. 2. Mild multilevel degenerative changes. Electronically Signed   By: Donavan Foil M.D.   On: 04/17/2021 17:23    Procedures Procedures   Medications Ordered in ED Medications  lidocaine (LIDODERM) 5 % 1 patch (1 patch Transdermal Patch  Applied 04/17/21 1655)  oxyCODONE-acetaminophen (PERCOCET/ROXICET) 5-325 MG per tablet 2 tablet (2 tablets Oral Given 04/17/21 1654)    ED Course  I have reviewed the triage vital signs and the nursing notes.  Pertinent labs & imaging results that were available during my care of the patient were reviewed by me and considered in my medical decision making (see chart for details).    MDM Rules/Calculators/A&P                         Patient recently evaluated for nontraumatic low back pain, encountered wall lifting boxes at work, been present for over a week at this point.  Patient has no red flags for back pain including chronic corticosteroid use, cancer, IV drug use, recent fever.  Patient reports she has been taking ibuprofen, muscle relaxant with minimal relief.  Patient does have some significant midline tenderness, is already been evaluated in his back for the same problem at this time.  Since patient does have  some midline tenderness, advanced age, some concern for possible occult compression fracture, will obtain CT of the lumbar spine to evaluate this time.  Will control patient's pain in the office at this time.  Minimal clinical concern for cauda equina, bilateral legs are neurovascularly intact.  Patient has no radiation of pain to suggest sciatica symptoms.  CT shows degenerative changes.  Patient discharged with prescription for prednisone, has norco for breakthrough pain at home.  Encouraged to follow-up with orthopedics at her earliest convenience. Patient understands and agrees to plan. Discharged in stable condition at this time. Final Clinical Impression(s) / ED Diagnoses Final diagnoses:  Acute bilateral low back pain without sciatica    Rx / DC Orders ED Discharge Orders          Ordered    predniSONE (STERAPRED UNI-PAK 21 TAB) 10 MG (21) TBPK tablet  Daily        04/17/21 1801             Anselmo Pickler, PA-C 04/17/21 1809    Jeanell Sparrow, DO 04/18/21 [phone]

## 2021-04-17 NOTE — Discharge Instructions (Signed)
Please use Tylenol or ibuprofen for pain.  You may use 600 mg ibuprofen every 6 hours or 1000 mg of Tylenol every 6 hours.  You may choose to alternate between the 2.  This would be most effective.  Not to exceed 4 g of Tylenol within 24 hours.  Not to exceed 3200 mg ibuprofen 24 hours.  Please use the robaxin, norco as needed for breakthrough pain. Please follow up with orthopedics if your pain worsens or fails to improve.

## 2021-04-19 ENCOUNTER — Other Ambulatory Visit (HOSPITAL_COMMUNITY): Payer: Self-pay

## 2021-04-21 ENCOUNTER — Other Ambulatory Visit (HOSPITAL_COMMUNITY): Payer: Self-pay

## 2021-04-22 ENCOUNTER — Other Ambulatory Visit (HOSPITAL_COMMUNITY): Payer: Self-pay

## 2021-04-25 ENCOUNTER — Other Ambulatory Visit (HOSPITAL_COMMUNITY): Payer: Self-pay

## 2021-04-28 ENCOUNTER — Other Ambulatory Visit (HOSPITAL_COMMUNITY): Payer: Self-pay

## 2021-04-29 ENCOUNTER — Other Ambulatory Visit (HOSPITAL_COMMUNITY): Payer: Self-pay

## 2021-05-11 ENCOUNTER — Other Ambulatory Visit (HOSPITAL_COMMUNITY): Payer: Self-pay

## 2021-05-11 MED ORDER — CYCLOBENZAPRINE HCL 10 MG PO TABS
ORAL_TABLET | ORAL | 0 refills | Status: DC
  Filled 2021-05-11: qty 90, 30d supply, fill #0

## 2021-05-11 MED ORDER — OXYCODONE-ACETAMINOPHEN 10-325 MG PO TABS
ORAL_TABLET | ORAL | 0 refills | Status: DC
  Filled 2021-05-11: qty 150, 30d supply, fill #0

## 2021-05-11 MED ORDER — VITAMIN D (ERGOCALCIFEROL) 1.25 MG (50000 UNIT) PO CAPS
ORAL_CAPSULE | ORAL | 1 refills | Status: DC
  Filled 2021-05-11: qty 12, 84d supply, fill #0

## 2021-05-11 MED ORDER — TOPIRAMATE 50 MG PO TABS
50.0000 mg | ORAL_TABLET | Freq: Every day | ORAL | 1 refills | Status: DC
  Filled 2021-05-11: qty 30, 30d supply, fill #0

## 2021-05-11 MED ORDER — NAPROXEN SODIUM ER 375 MG PO TB24
ORAL_TABLET | ORAL | 1 refills | Status: DC
  Filled 2021-05-11: qty 90, 90d supply, fill #0

## 2021-05-11 MED ORDER — ZTLIDO 1.8 % EX PTCH
MEDICATED_PATCH | CUTANEOUS | 0 refills | Status: DC
  Filled 2021-05-11: qty 30, 30d supply, fill #0

## 2021-05-11 MED ORDER — PHENTERMINE HCL 37.5 MG PO TABS
37.5000 mg | ORAL_TABLET | Freq: Every day | ORAL | 1 refills | Status: DC
  Filled 2021-05-11: qty 30, 30d supply, fill #0

## 2021-05-18 ENCOUNTER — Other Ambulatory Visit (HOSPITAL_COMMUNITY): Payer: Self-pay

## 2021-06-13 ENCOUNTER — Other Ambulatory Visit (HOSPITAL_COMMUNITY): Payer: Self-pay

## 2021-06-13 MED ORDER — NAPROXEN SODIUM ER 375 MG PO TB24
375.0000 mg | ORAL_TABLET | Freq: Every day | ORAL | 1 refills | Status: DC
  Filled 2021-06-13: qty 90, 90d supply, fill #0

## 2021-06-13 MED ORDER — CYCLOBENZAPRINE HCL 10 MG PO TABS
ORAL_TABLET | ORAL | 0 refills | Status: DC
  Filled 2021-06-13: qty 90, 30d supply, fill #0

## 2021-06-13 MED ORDER — TOPIRAMATE 50 MG PO TABS
50.0000 mg | ORAL_TABLET | Freq: Every day | ORAL | 0 refills | Status: DC
  Filled 2021-06-13: qty 30, 30d supply, fill #0

## 2021-06-13 MED ORDER — OXYCODONE-ACETAMINOPHEN 10-325 MG PO TABS
ORAL_TABLET | ORAL | 0 refills | Status: DC
  Filled 2021-06-13: qty 150, 30d supply, fill #0

## 2021-06-13 MED ORDER — PHENTERMINE HCL 37.5 MG PO TABS
37.5000 mg | ORAL_TABLET | Freq: Every day | ORAL | 0 refills | Status: DC
  Filled 2021-06-13: qty 30, 30d supply, fill #0

## 2021-06-13 MED ORDER — ERGOCALCIFEROL 1.25 MG (50000 UT) PO CAPS
50000.0000 [IU] | ORAL_CAPSULE | ORAL | 1 refills | Status: DC
  Filled 2021-06-13: qty 12, 84d supply, fill #0

## 2021-07-14 ENCOUNTER — Other Ambulatory Visit (HOSPITAL_COMMUNITY): Payer: Self-pay

## 2021-07-14 MED ORDER — CYCLOBENZAPRINE HCL 10 MG PO TABS
ORAL_TABLET | ORAL | 0 refills | Status: DC
  Filled 2021-07-14: qty 90, 30d supply, fill #0

## 2021-07-14 MED ORDER — PHENTERMINE HCL 37.5 MG PO TABS
ORAL_TABLET | ORAL | 0 refills | Status: DC
  Filled 2021-07-14: qty 30, 30d supply, fill #0

## 2021-07-14 MED ORDER — NAPROXEN SODIUM ER 375 MG PO TB24
ORAL_TABLET | ORAL | 1 refills | Status: DC
  Filled 2021-07-14: qty 90, 90d supply, fill #0

## 2021-07-14 MED ORDER — TOPIRAMATE 50 MG PO TABS
50.0000 mg | ORAL_TABLET | Freq: Every day | ORAL | 0 refills | Status: DC
  Filled 2021-07-14: qty 30, 30d supply, fill #0

## 2021-07-14 MED ORDER — OXYCODONE-ACETAMINOPHEN 10-325 MG PO TABS
ORAL_TABLET | ORAL | 0 refills | Status: DC
  Filled 2021-07-14: qty 150, 30d supply, fill #0

## 2021-07-28 ENCOUNTER — Other Ambulatory Visit (HOSPITAL_COMMUNITY): Payer: Self-pay

## 2021-07-28 MED ORDER — PRAVASTATIN SODIUM 80 MG PO TABS
ORAL_TABLET | ORAL | 1 refills | Status: DC
  Filled 2021-07-28: qty 90, 90d supply, fill #0

## 2021-07-28 MED ORDER — METOPROLOL SUCCINATE ER 25 MG PO TB24
ORAL_TABLET | ORAL | 1 refills | Status: DC
  Filled 2021-07-28: qty 90, 90d supply, fill #0

## 2021-07-28 MED ORDER — LISINOPRIL 10 MG PO TABS
ORAL_TABLET | ORAL | 1 refills | Status: DC
  Filled 2021-07-28: qty 90, 90d supply, fill #0

## 2021-07-28 MED ORDER — NITROFURANTOIN MACROCRYSTAL 100 MG PO CAPS
ORAL_CAPSULE | ORAL | 0 refills | Status: DC
  Filled 2021-07-28: qty 14, 7d supply, fill #0

## 2021-08-01 ENCOUNTER — Other Ambulatory Visit (HOSPITAL_COMMUNITY): Payer: Self-pay

## 2021-08-11 ENCOUNTER — Other Ambulatory Visit (HOSPITAL_COMMUNITY): Payer: Self-pay

## 2021-08-11 MED ORDER — TOPIRAMATE 50 MG PO TABS
50.0000 mg | ORAL_TABLET | Freq: Every day | ORAL | 0 refills | Status: DC
  Filled 2021-08-11: qty 30, 30d supply, fill #0

## 2021-08-11 MED ORDER — OXYCODONE-ACETAMINOPHEN 10-325 MG PO TABS
ORAL_TABLET | ORAL | 0 refills | Status: DC
  Filled 2021-08-11: qty 150, 30d supply, fill #0

## 2021-08-11 MED ORDER — ERGOCALCIFEROL 1.25 MG (50000 UT) PO CAPS
ORAL_CAPSULE | ORAL | 1 refills | Status: DC
  Filled 2021-08-11: qty 12, 84d supply, fill #0

## 2021-08-11 MED ORDER — CYCLOBENZAPRINE HCL 10 MG PO TABS
ORAL_TABLET | ORAL | 0 refills | Status: DC
  Filled 2021-08-11: qty 90, 30d supply, fill #0

## 2021-08-11 MED ORDER — PHENTERMINE HCL 37.5 MG PO TABS
ORAL_TABLET | ORAL | 0 refills | Status: DC
  Filled 2021-08-11 – 2021-10-19 (×3): qty 30, 30d supply, fill #0

## 2021-08-18 ENCOUNTER — Other Ambulatory Visit (HOSPITAL_COMMUNITY): Payer: Self-pay

## 2021-08-28 ENCOUNTER — Other Ambulatory Visit (HOSPITAL_COMMUNITY): Payer: Self-pay

## 2021-08-28 ENCOUNTER — Encounter: Payer: Self-pay | Admitting: Gastroenterology

## 2021-09-04 ENCOUNTER — Other Ambulatory Visit (HOSPITAL_COMMUNITY): Payer: Self-pay

## 2021-09-05 ENCOUNTER — Other Ambulatory Visit (HOSPITAL_COMMUNITY): Payer: Self-pay

## 2021-09-11 ENCOUNTER — Other Ambulatory Visit (HOSPITAL_COMMUNITY): Payer: Self-pay

## 2021-09-11 MED ORDER — OXYCODONE-ACETAMINOPHEN 10-325 MG PO TABS
ORAL_TABLET | ORAL | 0 refills | Status: DC
  Filled 2021-09-11: qty 150, 30d supply, fill #0

## 2021-09-11 MED ORDER — ERGOCALCIFEROL 1.25 MG (50000 UT) PO CAPS
ORAL_CAPSULE | ORAL | 1 refills | Status: DC
  Filled 2021-09-11: qty 12, 84d supply, fill #0

## 2021-09-11 MED ORDER — CYCLOBENZAPRINE HCL 10 MG PO TABS
10.0000 mg | ORAL_TABLET | Freq: Three times a day (TID) | ORAL | 0 refills | Status: DC | PRN
  Filled 2021-09-11: qty 90, 30d supply, fill #0

## 2021-09-18 NOTE — H&P (Signed)
?Patient's anticipated LOS is less than 2 midnights, meeting these requirements: ?- Younger than 67 ?- Lives within 1 hour of care ?- Has a competent adult at home to recover with post-op recover ?- NO history of ? - Chronic pain requiring opiods ? - Diabetes ? - Coronary Artery Disease ? - Heart failure ? - Heart attack ? - Stroke ? - DVT/VTE ? - Cardiac arrhythmia ? - Respiratory Failure/COPD ? - Renal failure ? - Anemia ? - Advanced Liver disease ? ?  ? ?Olivia Schmidt is an 61 y.o. female.   ? ?Chief Complaint: right shoulder pain ? ?HPI: Pt is a 61 y.o. female complaining of right shoulder pain for multiple years. Pain had continually increased since the beginning. 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 Pt has tried various conservative treatments which have failed to alleviate their symptoms, including injections and therapy. Various options are discussed with the patient. Risks, benefits and expectations were discussed with the patient. Patient understand the risks, benefits and expectations and wishes to proceed with surgery.  ? ?PCP:  [name], NP ? ?D/C Plans: Home ? ?PMH: ?Past Medical History:  ?Diagnosis Date  ? Acute bronchitis 05/08/2015  ? Anemia 05/07/2015  ? Atrial fibrillation with rapid ventricular response ([place])   ? Chest pain 05/07/2015  ? FEMALE INFERTILITY 10/21/2008  ? Qualifier: Diagnosis of  By: Amil Amen MD, Benjamine Mola    ? Fibroids   ? FIBROIDS, UTERUS 07/11/2009  ? Qualifier: Diagnosis of  By: Amil Amen MD, Benjamine Mola    ? Goiter   ? Hyperthyroidism 05/07/2015  ? Hypokalemia 05/08/2015  ? Hypomagnesemia 05/08/2015  ? PAF (paroxysmal atrial fibrillation) ([place]) 05/09/2015  ? Thrombocytopenia ([place]) 05/09/2015  ? ? ?PSH: ?Past Surgical History:  ?Procedure Laterality Date  ? ABDOMINAL HYSTERECTOMY    ? I & D EXTREMITY Right 10/27/2015  ? Procedure: IRRIGATION AND DEBRIDEMENT RIGHT SMALL FINGER;  Surgeon: Leanora Cover, MD;  Location: [facility];   Service: Orthopedics;  Laterality: Right;  With local block  ? ? ?Social History:  reports that she quit smoking about 18 years ago. Her smoking use included cigarettes. She has never used smokeless tobacco. She reports that she does not drink alcohol and does not use drugs. ? ?Allergies:  ?No Known Allergies ? ?Medications: ?Current Facility-Administered Medications  ?Medication Dose Route Frequency Provider Last Rate Last Admin  ? 0.9 %  sodium chloride infusion  500 mL Intravenous Continuous Danis, Kirke Corin, MD      ? ?Current Outpatient Medications  ?Medication Sig Dispense Refill  ? acetaminophen (TYLENOL 8 HOUR) 650 MG CR tablet Take 1 tablet (650 mg total) by mouth every 8 (eight) hours as needed. 30 tablet 0  ? aspirin EC 81 MG EC tablet Take 1 tablet (81 mg total) by mouth daily.    ? cyclobenzaprine (FLEXERIL) 10 MG tablet Take 1 tablet (10 mg total) by mouth 2 (two) times daily as needed for muscle spasms. 60 tablet 1  ? cyclobenzaprine (FLEXERIL) 10 MG tablet Take 1 tablet by mouth 3 times a day as needed for muscle spasms 90 tablet 0  ? cyclobenzaprine (FLEXERIL) 10 MG tablet Take 1 tablet by mouth 3 times daily as needed for muscle spasms 90 tablet 0  ? ergocalciferol (VITAMIN D2) 1.25 MG (50000 UT) capsule Take 1 capsule (50,000 Units total) by mouth once a week. 12 capsule 1  ? ergocalciferol (VITAMIN D2) 1.25 MG (50000 UT) capsule Take 1 (one)  capsule by mouth weekly 12 capsule 1  ? ergocalciferol (VITAMIN D2) 1.25 MG (50000 UT) capsule Take 1 capsule by mouth once a week 12 capsule 1  ? ibuprofen (ADVIL,MOTRIN) 600 MG tablet Take 1 tablet (600 mg total) by mouth every 6 (six) hours as needed. 30 tablet 0  ? lidocaine (LIDODERM) 5 % Place 1 patch onto the skin daily. Remove & Discard patch within 12 hours or as directed by MD 30 patch 0  ? Lidocaine (ZTLIDO) 1.8 % PTCH Apply 1 (one) Patch daily to lower back as directed 30 patch 0  ? lisinopril (ZESTRIL) 10 MG tablet TAKE 1 (ONE) TABLET BY MOUTH  IN THE EVENING 90 tablet 1  ? lisinopril (ZESTRIL) 10 MG tablet TAKE 1 (ONE) TABLET BY MOUTH IN THE EVENING 90 tablet 1  ? lisinopril (ZESTRIL) 10 MG tablet Take 1 (one) Tablet by mouth in evening 90 tablet 1  ? lisinopril (ZESTRIL) 10 MG tablet Take 1 (one) Tablet Tablet by mouth in the evening 90 tablet 1  ? lisinopril (ZESTRIL) 10 MG tablet Take 1 tablet by mouth daily in evening 90 tablet 1  ? lisinopril (ZESTRIL) 10 MG tablet Take 1 (one) Tablet by mouth every evening 90 tablet 1  ? lisinopril (ZESTRIL) 10 MG tablet Take 1 (one) tablet by mouth daily in evening 90 tablet 1  ? Magnesium 250 MG TABS Take 1 tablet by mouth at bedtime 90 tablet 1  ? metFORMIN (GLUCOPHAGE) 500 MG tablet Take 1 tablet by mouth daily 30 tablet 3  ? metoprolol succinate (TOPROL-XL) 25 MG 24 hr tablet TAKE 1 (ONE) TABLET BY MOUTH DAILY FOR HYPERTENSION 90 tablet 1  ? metoprolol succinate (TOPROL-XL) 25 MG 24 hr tablet TAKE 1 (ONE) TABLET BY MOUTH DAILY FOR HYPERTENSION 90 tablet 1  ? metoprolol succinate (TOPROL-XL) 25 MG 24 hr tablet Take 1 (one) Tablet by mouth daily for hypertension 90 tablet 1  ? metoprolol succinate (TOPROL-XL) 25 MG 24 hr tablet Take 1 tablet (25 mg total) by mouth daily for hypertension 90 tablet 1  ? metoprolol succinate (TOPROL-XL) 25 MG 24 hr tablet Take 1 (one) tablet by mouth daily for hypertension 90 tablet 1  ? naproxen (NAPROSYN) 375 MG tablet Take 1 tablet (375 mg total) by mouth 2 (two) times daily. 20 tablet 0  ? Naproxen Sodium 375 MG TB24 Take 1 tablet (375 mg total) by mouth daily. 90 tablet 1  ? Naproxen Sodium 375 MG TB24 Take 1 tablet (375 mg total) by mouth daily. 90 tablet 1  ? Naproxen Sodium 375 MG TB24 Take 1 tablet by mouth daily. 90 tablet 1  ? Naproxen Sodium 375 MG TB24 Take 1 (one) Tablet by mouth daily 90 tablet 1  ? Naproxen Sodium 375 MG TB24 Take 1 tablet (375 mg total) by mouth daily. 90 tablet 1  ? Naproxen Sodium 375 MG TB24 Take 1 tablet by mouth daily 90 tablet 1  ?  nitrofurantoin (MACRODANTIN) 100 MG capsule Take 1 capsule by mouth two times daily 14 capsule 0  ? oxyCODONE-acetaminophen (PERCOCET) 10-325 MG tablet Take 1 tablet by mouth 5 times a day as needed for pain 150 tablet 0  ? phentermine (ADIPEX-P) 37.5 MG tablet TAKE 1 TABLET BY MOUTH DAILY 30 tablet 0  ? phentermine (ADIPEX-P) 37.5 MG tablet TAKE 1 TABLET BY MOUTH DAILY 30 tablet 0  ? phentermine (ADIPEX-P) 37.5 MG tablet TAKE 1 TABLET BY MOUTH DAILY 30 tablet 0  ? phentermine (ADIPEX-P) 37.5 MG  tablet Take 1 tablet by mouth daily 30 tablet 1  ? phentermine (ADIPEX-P) 37.5 MG tablet Take 1 tablet (37.5 mg total) by mouth daily. 30 tablet 0  ? phentermine (ADIPEX-P) 37.5 MG tablet Take 1 tablet by mouth daily 30 tablet 0  ? phentermine (ADIPEX-P) 37.5 MG tablet Take 1 (one) Tablet by mouth daily 30 tablet 0  ? pravastatin (PRAVACHOL) 80 MG tablet Take 1 (one) tablet by mouth daily in evening 90 tablet 1  ? predniSONE (STERAPRED UNI-PAK 21 TAB) 10 MG (21) TBPK tablet Take by mouth daily. Take 6 tabs by mouth daily  for 2 days, then 5 tabs for 2 days, then 4 tabs for 2 days, then 3 tabs for 2 days, 2 tabs for 2 days, then 1 tab by mouth daily for 2 days 42 tablet 0  ? Semaglutide, 1 MG/DOSE, (OZEMPIC, 1 MG/DOSE,) 4 MG/3ML SOPN Inject 1 mg under skin weekly 9 mL 1  ? Semaglutide, 1 MG/DOSE, (OZEMPIC, 1 MG/DOSE,) 4 MG/3ML SOPN Inject 1 (one) Milligram under the skin weekly as directed 3 mL 1  ? Semaglutide, 1 MG/DOSE, (OZEMPIC, 1 MG/DOSE,) 4 MG/3ML SOPN Inject 1 mg under the skin once a week 9 mL 1  ? topiramate (TOPAMAX) 50 MG tablet Take 1 tablet by mouth daily 30 tablet 0  ? topiramate (TOPAMAX) 50 MG tablet Take 1 tablet by mouth daily 30 tablet 0  ? topiramate (TOPAMAX) 50 MG tablet Take 1 tablet by mouth daily 30 tablet 0  ? topiramate (TOPAMAX) 50 MG tablet Take 1 tablet by mouth daily 30 tablet 0  ? topiramate (TOPAMAX) 50 MG tablet Take 1 tablet by mouth daily 30 tablet 1  ? topiramate (TOPAMAX) 50 MG tablet  Take 1 tablet by mouth daily 30 tablet 1  ? topiramate (TOPAMAX) 50 MG tablet Take 1 tablet (50 mg total) by mouth daily. 30 tablet 0  ? topiramate (TOPAMAX) 50 MG tablet Take 1 tablet by mouth daily 30 tablet 0  ? topira

## 2021-09-21 NOTE — Progress Notes (Addendum)
Anesthesia Review: ? ?PCP: [place] on [facility] and requested clearance from [place] at [employer].  LVMM.   ?Cardiologist : none  ?Chest x-ray : ?EKG : 45/17/23  ?Echo : 2016  ?Stress test: ?Cardiac Cath :  ?Activity level: can do a flgiht of stairs without difficulty l ?Sleep Study/ CPAP : none  ?Fasting Blood Sugar :      / Checks Blood Sugar -- times a day:   ?Blood Thinner/ Instructions /Last Dose: ?ASA / Instructions/ Last Dose :  ?Phentermine and ozempic - last doses on 09/11/21.   ?

## 2021-09-21 NOTE — Progress Notes (Signed)
DUE TO COVID-19 ONLY  2  VISITOR IS ALLOWED TO COME WITH YOU AND STAY IN THE WAITING ROOM ONLY DURING PRE OP AND PROCEDURE DAY OF SURGERY.   4 VISITOR  MAY VISIT WITH YOU AFTER SURGERY IN YOUR PRIVATE ROOM DURING VISITING HOURS ONLY! ?YOU MAY HAVE ONE PERSON SPEND THE NITE WITH YOU IN YOUR ROOM AFTER SURGERY.   ? ? Your procedure is scheduled on:  ?                       10/06/21  ? Report to [facility] Main  Entrance ? ? Report to admitting at    0515              AM ?DO NOT [place], PICTURE ID OR WALLET DAY OF SURGERY.  ?  ? ? Call this number if you have problems the morning of surgery [phone]  ? ? REMEMBER: NO  SOLID FOODS , CANDY, GUM OR MINTS AFTER MIDNITE THE NITE BEFORE SURGERY .       Marland Kitchen CLEAR LIQUIDS UNTIL     0430am            DAY OF SURGERY.      PLEASE FINISH ENSURE DRINK PER SURGEON ORDER  WHICH NEEDS TO BE COMPLETED AT  0430am        MORNING OF SURGERY.   ? ? ? ? ?CLEAR LIQUID DIET ? ? ?Foods Allowed      ?WATER ?BLACK COFFEE ( SUGAR OK, NO MILK, CREAM OR CREAMER) REGULAR AND DECAF  ?TEA ( SUGAR OK NO MILK, CREAM, OR CREAMER) REGULAR AND DECAF  ?PLAIN JELLO ( NO RED)  ?FRUIT ICES ( NO RED, NO FRUIT PULP)  ?POPSICLES ( NO RED)  ?JUICE- APPLE, WHITE GRAPE AND WHITE CRANBERRY  ?SPORT DRINK LIKE GATORADE ( NO RED)  ?CLEAR BROTH ( VEGETABLE , CHICKEN OR BEEF)                                                               ? ?    ? ?BRUSH YOUR TEETH MORNING OF SURGERY AND RINSE YOUR MOUTH OUT, NO CHEWING GUM CANDY OR MINTS. ?  ? ? Take these medicines the morning of surgery with A SIP OF WATER:  inhalers as usual and bring, topamax, toprol  ? ? ?DO NOT TAKE ANY DIABETIC MEDICATIONS DAY OF YOUR SURGERY ?                  ?            You may not have any metal on your body including hair pins and  ?            piercings  Do not wear jewelry, make-up, lotions, powders or perfumes, deodorant ?            Do not wear nail polish on your fingernails.   ?           IF YOU ARE A FEMALE AND  WANT TO SHAVE UNDER ARMS OR LEGS PRIOR TO SURGERY YOU MUST DO SO AT LEAST 48 HOURS PRIOR TO SURGERY.  ?            Men may shave face and neck. ? ?  Do not bring valuables to the hospital. [facility] NOT ?            RESPONSIBLE   FOR VALUABLES. ? Contacts, dentures or bridgework may not be worn into surgery. ? Leave suitcase in the car. After surgery it may be brought to your room. ? ?  ? Patients discharged the day of surgery will not be allowed to drive home. IF YOU ARE HAVING SURGERY AND GOING HOME THE SAME DAY, YOU MUST HAVE AN ADULT TO DRIVE YOU HOME AND BE WITH YOU FOR 24 HOURS. YOU MAY GO HOME BY TAXI OR UBER OR ORTHERWISE, BUT AN ADULT MUST ACCOMPANY YOU HOME AND STAY WITH YOU FOR 24 HOURS. ?  ? ?            Please read over the following fact sheets you were given: ?_____________________________________________________________________ ? ?[facility] - Preparing for Surgery ?Before surgery, you can play an important role.  Because skin is not sterile, your skin needs to be as free of germs as possible.  You can reduce the number of germs on your skin by washing with CHG (chlorahexidine gluconate) soap before surgery.  CHG is an antiseptic cleaner which kills germs and bonds with the skin to continue killing germs even after washing. ?Please DO NOT use if you have an allergy to CHG or antibacterial soaps.  If your skin becomes reddened/irritated stop using the CHG and inform your nurse when you arrive at Short Stay. ?Do not shave (including legs and underarms) for at least 48 hours prior to the first CHG shower.  You may shave your face/neck. ?Please follow these instructions carefully: ? 1.  Shower with CHG Soap the night before surgery and the  morning of Surgery. ? 2.  If you choose to wash your hair, wash your hair first as usual with your  normal  shampoo. ? 3.  After you shampoo, rinse your hair and body thoroughly to remove the  shampoo.                           4.  Use CHG as you would any other  liquid soap.  You can apply chg directly  to the skin and wash  ?                     Gently with a scrungie or clean washcloth. ? 5.  Apply the CHG Soap to your body ONLY FROM THE NECK DOWN.   Do not use on face/ open      ?                     Wound or open sores. Avoid contact with eyes, ears mouth and genitals (private parts).  ?                     Production manager,  Genitals (private parts) with your normal soap. ?            6.  Wash thoroughly, paying special attention to the area where your surgery  will be performed. ? 7.  Thoroughly rinse your body with warm water from the neck down. ? 8.  DO NOT shower/wash with your normal soap after using and rinsing off  the CHG Soap. ?               9.  Pat yourself dry with a clean towel. ?  10.  Wear clean pajamas. ?           11.  Place clean sheets on your bed the night of your first shower and do not  sleep with pets. ?Day of Surgery : ?Do not apply any lotions/deodorants the morning of surgery.  Please wear clean clothes to the hospital/surgery center. ? ?FAILURE TO FOLLOW THESE INSTRUCTIONS MAY RESULT IN THE CANCELLATION OF YOUR SURGERY ?PATIENT SIGNATURE_________________________________ ? ?NURSE SIGNATURE__________________________________ ? ?________________________________________________________________________  ? ? ?           ?

## 2021-09-21 NOTE — Progress Notes (Signed)
[facility]- Preparing for Total Shoulder Arthroplasty  °  °Before surgery, you can play an important role. Because skin is not sterile, your skin needs to be as free of germs as possible. You can reduce the number of germs on your skin by using the following products. °Benzoyl Peroxide Gel °Reduces the number of germs present on the skin °Applied twice a day to shoulder area starting two days before surgery   ° °================================================================== ° °Please follow these instructions carefully: ° °BENZOYL PEROXIDE 5% GEL ° °Please do not use if you have an allergy to benzoyl peroxide.   If your skin becomes reddened/irritated stop using the benzoyl peroxide. ° °Starting two days before surgery, apply as follows: °Apply benzoyl peroxide in the morning and at night. Apply after taking a shower. If you are not taking a shower clean entire shoulder front, back, and side along with the armpit with a clean wet washcloth. ° °Place a quarter-sized dollop on your shoulder and rub in thoroughly, making sure to cover the front, back, and side of your shoulder, along with the armpit.  ° °2 days before ____ AM   ____ PM              1 day before ____ AM   ____ PM °                        °Do this twice a day for two days.  (Last application is the night before surgery, AFTER using the CHG soap as described below). ° °Do NOT apply benzoyl peroxide gel on the day of surgery.  °

## 2021-09-25 ENCOUNTER — Other Ambulatory Visit: Payer: Self-pay

## 2021-09-25 ENCOUNTER — Encounter (HOSPITAL_COMMUNITY): Payer: Self-pay

## 2021-09-25 ENCOUNTER — Encounter (HOSPITAL_COMMUNITY)
Admission: RE | Admit: 2021-09-25 | Discharge: 2021-09-25 | Disposition: A | Discharge status: Home / Self Care | Payer: BC Managed Care – PPO | Source: Ambulatory Visit | Attending: Orthopedic Surgery | Admitting: Orthopedic Surgery

## 2021-09-25 VITALS — BP 174/84 | HR 66 | Temp 98.0°F | Resp 16 | Ht 64.0 in | Wt 224.0 lb

## 2021-09-25 DIAGNOSIS — Z01818 Encounter for other preprocedural examination: Secondary | ICD-10-CM

## 2021-09-25 HISTORY — DX: Essential (primary) hypertension: I10

## 2021-09-25 LAB — CBC
HCT: 37.4 % (ref 36.0–46.0)
Hemoglobin: 11.6 g/dL — ABNORMAL LOW (ref 12.0–15.0)
MCH: 28.3 pg (ref 26.0–34.0)
MCHC: 31 g/dL (ref 30.0–36.0)
MCV: 91.2 fL (ref 80.0–100.0)
Platelets: 181 10*3/uL (ref 150–400)
RBC: 4.1 MIL/uL (ref 3.87–5.11)
RDW: 14.6 % (ref 11.5–15.5)
WBC: 5.1 10*3/uL (ref 4.0–10.5)
nRBC: 0 % (ref 0.0–0.2)

## 2021-09-25 LAB — BASIC METABOLIC PANEL
Anion gap: 6 (ref 5–15)
BUN: 13 mg/dL (ref 6–20)
CO2: 29 mmol/L (ref 22–32)
Calcium: 9 mg/dL (ref 8.9–10.3)
Chloride: 106 mmol/L (ref 98–111)
Creatinine, Ser: 0.84 mg/dL (ref 0.44–1.00)
GFR, Estimated: >60 mL/min (ref >60)
Glucose, Bld: 112 mg/dL — ABNORMAL HIGH (ref 70–99)
Potassium: 4.1 mmol/L (ref 3.5–5.1)
Sodium: 141 mmol/L (ref 135–145)

## 2021-09-25 LAB — SURGICAL PCR SCREEN
MRSA, PCR: NEGATIVE
Staphylococcus aureus: NEGATIVE

## 2021-10-05 NOTE — H&P (Signed)
?Patient's anticipated LOS is less than 2 midnights, meeting these requirements: ?- Younger than 62 ?- Lives within 1 hour of care ?- Has a competent adult at home to recover with post-op recover ?- NO history of ? - Chronic pain requiring opiods ? - Diabetes ? - Coronary Artery Disease ? - Heart failure ? - Heart attack ? - Stroke ? - DVT/VTE ? - Cardiac arrhythmia ? - Respiratory Failure/COPD ? - Renal failure ? - Anemia ? - Advanced Liver disease ? ?  ? ?Olivia Schmidt is an 61 y.o. female.   ? ?Chief Complaint: right shoulder pain ? ?HPI: Pt is a 61 y.o. female complaining of right shoulder pain for multiple years. Pain had continually increased since the beginning. 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 Pt has tried various conservative treatments which have failed to alleviate their symptoms, including injections and therapy. Various options are discussed with the patient. Risks, benefits and expectations were discussed with the patient. Patient understand the risks, benefits and expectations and wishes to proceed with surgery.  ? ?PCP:  [name], NP ? ?D/C Plans: Home ? ?PMH: ?Past Medical History:  ?Diagnosis Date  ? Acute bronchitis 05/08/2015  ? Anemia 05/07/2015  ? Atrial fibrillation with rapid ventricular response ([place])   ? Chest pain 05/07/2015  ? FEMALE INFERTILITY 10/21/2008  ? Qualifier: Diagnosis of  By: Amil Amen MD, Benjamine Mola    ? Fibroids   ? FIBROIDS, UTERUS 07/11/2009  ? Qualifier: Diagnosis of  By: Amil Amen MD, Benjamine Mola    ? Goiter   ? Hypertension   ? Hyperthyroidism 05/07/2015  ? Hypokalemia 05/08/2015  ? Hypomagnesemia 05/08/2015  ? PAF (paroxysmal atrial fibrillation) ([place]) 05/09/2015  ? Thrombocytopenia ([place]) 05/09/2015  ? ? ?PSH: ?Past Surgical History:  ?Procedure Laterality Date  ? ABDOMINAL HYSTERECTOMY    ? I & D EXTREMITY Right 10/27/2015  ? Procedure: IRRIGATION AND DEBRIDEMENT RIGHT SMALL FINGER;  Surgeon: Leanora Cover, MD;   Location: [facility] ORS;  Service: Orthopedics;  Laterality: Right;  With local block  ? ? ?Social History:  reports that she quit smoking about 18 years ago. Her smoking use included cigarettes. She has never used smokeless tobacco. She reports that she does not drink alcohol and does not use drugs. ?BMI: ?There is no height or weight on file to calculate BMI. ? ?Lab Results  ?Component Value Date  ? ALBUMIN 3.0 (L) 05/08/2015  ? ? ? ?Diabetes: ?Patient does not have a diagnosis of diabetes. ?  ? ?.  ?  ?Smoking Status: ?Social History  ? ?Tobacco Use  ?Smoking Status Former  ? Years: 24.00  ? Types: Cigarettes  ? Quit date: 06/12/2003  ? Years since quitting: 18.3  ?Smokeless Tobacco Never  ? ?Counseling given: Not Answered ? ? ? ? ? ? ? ? ? ?  ?Allergies:  ?No Known Allergies ? ?Medications: ?Current Facility-Administered Medications  ?Medication Dose Route Frequency Provider Last Rate Last Admin  ? 0.9 %  sodium chloride infusion  500 mL Intravenous Continuous Danis, Kirke Corin, MD      ? ?Current Outpatient Medications  ?Medication Sig Dispense Refill  ? acetaminophen (TYLENOL 8 HOUR) 650 MG CR tablet Take 1 tablet (650 mg total) by mouth every 8 (eight) hours as needed. (Patient taking differently: Take 650 mg by mouth every 8 (eight) hours as needed for pain.) 30 tablet 0  ? albuterol (VENTOLIN HFA) 108 (90 Base) MCG/ACT inhaler Inhale 1 puff into  the lungs every 6 (six) hours as needed for shortness of breath.    ? cyclobenzaprine (FLEXERIL) 10 MG tablet Take 1 tablet by mouth 3 times a day as needed for muscle spasms 90 tablet 0  ? ergocalciferol (VITAMIN D2) 1.25 MG (50000 UT) capsule Take 1 capsule (50,000 Units total) by mouth once a week. 12 capsule 1  ? oxyCODONE-acetaminophen (PERCOCET) 10-325 MG tablet Take 1 tablet by mouth 5 times a day as needed for pain 150 tablet 0  ? pravastatin (PRAVACHOL) 80 MG tablet Take 1 (one) tablet by mouth daily in evening 90 tablet 1  ? ergocalciferol (VITAMIN D2) 1.25 MG  (50000 UT) capsule Take 1 (one) capsule by mouth weekly 12 capsule 1  ? ergocalciferol (VITAMIN D2) 1.25 MG (50000 UT) capsule Take 1 capsule by mouth once a week 12 capsule 1  ? lisinopril (ZESTRIL) 10 MG tablet TAKE 1 (ONE) TABLET BY MOUTH IN THE EVENING (Patient taking differently: Pt takes in the am) 90 tablet 1  ? phentermine (ADIPEX-P) 37.5 MG tablet Take 1 (one) Tablet by mouth daily (Patient not taking: Reported on 09/19/2021) 30 tablet 0  ? Semaglutide, 1 MG/DOSE, (OZEMPIC, 1 MG/DOSE,) 4 MG/3ML SOPN Inject 1 mg under the skin once a week (Patient not taking: Reported on 09/19/2021) 9 mL 1  ? topiramate (TOPAMAX) 50 MG tablet Take 1 tablet (50 mg total) by mouth daily. (Patient not taking: Reported on 09/19/2021) 30 tablet 0  ? topiramate (TOPAMAX) 50 MG tablet Take 1 tablet by mouth daily. 30 tablet 0  ? ? ?No results found for this or any previous visit (from the past 48 hour(s)). ?No results found. ? ?ROS: ?Pain with rom of the right upper extremity ? ?Physical Exam: ?Alert and oriented 61 y.o. female in no acute distress ?Cranial nerves 2-12 intact ?Cervical spine: full rom with no tenderness, nv intact distally ?Chest: active breath sounds bilaterally, no wheeze rhonchi or rales ?Heart: regular rate and rhythm, no murmur ?Abd: non tender non distended with active bowel sounds ?Hip is stable with rom  ?Right shoulder painful rom ?Strength of ER and IR 4.5/5  ?No rashes or edema ? ?Assessment/Plan ?Assessment: right shoulder end stage osteoarthritis ? ?Plan: ? ?Patient will undergo a right total shoulder by Dr. Veverly Fells at [facility] Risks benefits and expectations were discussed with the patient. Patient understand risks, benefits and expectations and wishes to proceed. ?Preoperative templating of the joint replacement has been completed, documented, and submitted to the Operating Room personnel in order to optimize intra-operative equipment management.  ? ?Merla Riches PA-C, MPAS ?[facility] is now  [facility] ?[address], [address], [place], [place] [address] ?Phone: [phone] ?[email or web address] ?Facebook  Engineer, structural  ? ?  ? ?

## 2021-10-06 ENCOUNTER — Observation Stay (HOSPITAL_COMMUNITY)
Admission: RE | Admit: 2021-10-06 | Discharge: 2021-10-07 | Disposition: A | Discharge status: Home / Self Care | Payer: BC Managed Care – PPO | Source: Ambulatory Visit | Attending: Orthopedic Surgery | Admitting: Orthopedic Surgery

## 2021-10-06 ENCOUNTER — Ambulatory Visit (HOSPITAL_COMMUNITY): Payer: BC Managed Care – PPO | Admitting: Certified Registered Nurse Anesthetist

## 2021-10-06 ENCOUNTER — Encounter (HOSPITAL_COMMUNITY)
Admission: RE | Disposition: A | Discharge status: Home / Self Care | Payer: Self-pay | Source: Ambulatory Visit | Attending: Orthopedic Surgery

## 2021-10-06 ENCOUNTER — Observation Stay (HOSPITAL_COMMUNITY): Payer: BC Managed Care – PPO

## 2021-10-06 ENCOUNTER — Other Ambulatory Visit: Payer: Self-pay

## 2021-10-06 ENCOUNTER — Ambulatory Visit (HOSPITAL_COMMUNITY): Payer: BC Managed Care – PPO | Admitting: Physician Assistant

## 2021-10-06 DIAGNOSIS — Z7982 Long term (current) use of aspirin: Secondary | ICD-10-CM | POA: Insufficient documentation

## 2021-10-06 DIAGNOSIS — M19011 Primary osteoarthritis, right shoulder: Secondary | ICD-10-CM | POA: Diagnosis present

## 2021-10-06 DIAGNOSIS — E039 Hypothyroidism, unspecified: Secondary | ICD-10-CM | POA: Insufficient documentation

## 2021-10-06 DIAGNOSIS — R062 Wheezing: Secondary | ICD-10-CM | POA: Diagnosis not present

## 2021-10-06 DIAGNOSIS — Z79899 Other long term (current) drug therapy: Secondary | ICD-10-CM | POA: Diagnosis not present

## 2021-10-06 DIAGNOSIS — Z7984 Long term (current) use of oral hypoglycemic drugs: Secondary | ICD-10-CM | POA: Insufficient documentation

## 2021-10-06 DIAGNOSIS — R059 Cough, unspecified: Secondary | ICD-10-CM | POA: Insufficient documentation

## 2021-10-06 DIAGNOSIS — Z96611 Presence of right artificial shoulder joint: Secondary | ICD-10-CM

## 2021-10-06 DIAGNOSIS — I10 Essential (primary) hypertension: Secondary | ICD-10-CM | POA: Diagnosis not present

## 2021-10-06 DIAGNOSIS — Z87891 Personal history of nicotine dependence: Secondary | ICD-10-CM | POA: Diagnosis not present

## 2021-10-06 HISTORY — PX: TOTAL SHOULDER ARTHROPLASTY: SHX126

## 2021-10-06 SURGERY — ARTHROPLASTY, SHOULDER, TOTAL
Anesthesia: Regional | Site: Shoulder | Laterality: Right

## 2021-10-06 MED ORDER — TOPIRAMATE 25 MG PO TABS: 50.0000 mg | ORAL_TABLET | Freq: Every day | ORAL | Status: DC

## 2021-10-06 MED ORDER — THROMBIN (RECOMBINANT) 5000 UNITS EX SOLR
CUTANEOUS | Status: AC
  Filled 2021-10-06: qty 5000

## 2021-10-06 MED ORDER — LISINOPRIL 10 MG PO TABS
10.0000 mg | ORAL_TABLET | Freq: Every day | ORAL | Status: DC
  Administered 2021-10-06 – 2021-10-07 (×2): 10 mg via ORAL
  Filled 2021-10-06 (×2): qty 1

## 2021-10-06 MED ORDER — FENTANYL CITRATE (PF) 100 MCG/2ML IJ SOLN
INTRAMUSCULAR | Status: AC
  Filled 2021-10-06: qty 2

## 2021-10-06 MED ORDER — ROCURONIUM BROMIDE 10 MG/ML (PF) SYRINGE
PREFILLED_SYRINGE | INTRAVENOUS | Status: AC
  Filled 2021-10-06: qty 10

## 2021-10-06 MED ORDER — SODIUM CHLORIDE 0.9 % IV SOLN: INTRAVENOUS | Status: DC

## 2021-10-06 MED ORDER — POLYETHYLENE GLYCOL 3350 17 G PO PACK: 17.0000 g | PACK | Freq: Every day | ORAL | Status: DC | PRN

## 2021-10-06 MED ORDER — PHENTERMINE HCL 37.5 MG PO TABS: 37.5000 mg | ORAL_TABLET | Freq: Every day | ORAL | Status: DC

## 2021-10-06 MED ORDER — ACETAMINOPHEN 325 MG PO TABS
650.0000 mg | ORAL_TABLET | Freq: Three times a day (TID) | ORAL | Status: DC | PRN
  Administered 2021-10-06: 650 mg via ORAL
  Filled 2021-10-06: qty 2

## 2021-10-06 MED ORDER — ONDANSETRON HCL 4 MG/2ML IJ SOLN
INTRAMUSCULAR | Status: DC | PRN
  Administered 2021-10-06: 4 mg via INTRAVENOUS

## 2021-10-06 MED ORDER — CEFAZOLIN SODIUM-DEXTROSE 2-4 GM/100ML-% IV SOLN
2.0000 g | Freq: Four times a day (QID) | INTRAVENOUS | Status: AC
  Administered 2021-10-06 – 2021-10-07 (×3): 2 g via INTRAVENOUS
  Filled 2021-10-06 (×3): qty 100

## 2021-10-06 MED ORDER — CEFAZOLIN SODIUM-DEXTROSE 2-4 GM/100ML-% IV SOLN
2.0000 g | INTRAVENOUS | Status: AC
  Administered 2021-10-06: 2 g via INTRAVENOUS
  Filled 2021-10-06: qty 100

## 2021-10-06 MED ORDER — ACETAMINOPHEN 10 MG/ML IV SOLN
1000.0000 mg | Freq: Once | INTRAVENOUS | Status: DC | PRN
  Administered 2021-10-06: 1000 mg via INTRAVENOUS

## 2021-10-06 MED ORDER — ALBUTEROL SULFATE (2.5 MG/3ML) 0.083% IN NEBU: 2.5000 mg | INHALATION_SOLUTION | Freq: Four times a day (QID) | RESPIRATORY_TRACT | Status: DC | PRN

## 2021-10-06 MED ORDER — SUCCINYLCHOLINE CHLORIDE 200 MG/10ML IV SOSY
PREFILLED_SYRINGE | INTRAVENOUS | Status: DC | PRN
Start: 2021-10-06 — End: 2021-10-06
  Administered 2021-10-06: 120 mg via INTRAVENOUS

## 2021-10-06 MED ORDER — CYCLOBENZAPRINE HCL 10 MG PO TABS
10.0000 mg | ORAL_TABLET | Freq: Three times a day (TID) | ORAL | Status: DC | PRN
  Administered 2021-10-06 – 2021-10-07 (×2): 10 mg via ORAL
  Filled 2021-10-06 (×2): qty 1

## 2021-10-06 MED ORDER — OXYCODONE HCL 5 MG PO TABS: 5.0000 mg | ORAL_TABLET | Freq: Once | ORAL | Status: DC | PRN

## 2021-10-06 MED ORDER — ACETAMINOPHEN 500 MG PO TABS: 1000.0000 mg | ORAL_TABLET | Freq: Once | ORAL | Status: DC | PRN

## 2021-10-06 MED ORDER — SUCCINYLCHOLINE CHLORIDE 200 MG/10ML IV SOSY
PREFILLED_SYRINGE | INTRAVENOUS | Status: AC
  Filled 2021-10-06: qty 10

## 2021-10-06 MED ORDER — ONDANSETRON HCL 4 MG/2ML IJ SOLN
INTRAMUSCULAR | Status: AC
  Filled 2021-10-06: qty 2

## 2021-10-06 MED ORDER — BUPIVACAINE-EPINEPHRINE (PF) 0.25% -1:200000 IJ SOLN
INTRAMUSCULAR | Status: DC | PRN
  Administered 2021-10-06: 15 mL

## 2021-10-06 MED ORDER — ONDANSETRON HCL 4 MG/2ML IJ SOLN: 4.0000 mg | Freq: Four times a day (QID) | INTRAMUSCULAR | Status: DC | PRN

## 2021-10-06 MED ORDER — STERILE WATER FOR IRRIGATION IR SOLN
Status: DC | PRN
  Administered 2021-10-06: 2000 mL

## 2021-10-06 MED ORDER — LACTATED RINGERS IV SOLN: INTRAVENOUS | Status: DC

## 2021-10-06 MED ORDER — METOCLOPRAMIDE HCL 5 MG/ML IJ SOLN: 5.0000 mg | Freq: Three times a day (TID) | INTRAMUSCULAR | Status: DC | PRN

## 2021-10-06 MED ORDER — ONDANSETRON HCL 4 MG PO TABS: 4.0000 mg | ORAL_TABLET | Freq: Four times a day (QID) | ORAL | Status: DC | PRN

## 2021-10-06 MED ORDER — BISACODYL 10 MG RE SUPP: 10.0000 mg | Freq: Every day | RECTAL | Status: DC | PRN

## 2021-10-06 MED ORDER — LIDOCAINE HCL (PF) 2 % IJ SOLN
INTRAMUSCULAR | Status: AC
  Filled 2021-10-06: qty 5

## 2021-10-06 MED ORDER — PHENOL 1.4 % MT LIQD: 1.0000 | OROMUCOSAL | Status: DC | PRN

## 2021-10-06 MED ORDER — DEXAMETHASONE SODIUM PHOSPHATE 10 MG/ML IJ SOLN
INTRAMUSCULAR | Status: AC
  Filled 2021-10-06: qty 1

## 2021-10-06 MED ORDER — FENTANYL CITRATE PF 50 MCG/ML IJ SOSY: 25.0000 ug | PREFILLED_SYRINGE | INTRAMUSCULAR | Status: DC | PRN

## 2021-10-06 MED ORDER — PHENYLEPHRINE HCL-NACL 20-0.9 MG/250ML-% IV SOLN
INTRAVENOUS | Status: AC
  Filled 2021-10-06: qty 750

## 2021-10-06 MED ORDER — OXYCODONE-ACETAMINOPHEN 10-325 MG PO TABS
1.0000 | ORAL_TABLET | Freq: Three times a day (TID) | ORAL | Status: DC | PRN
Start: 2021-10-06 — End: 2021-10-06

## 2021-10-06 MED ORDER — DOCUSATE SODIUM 100 MG PO CAPS
100.0000 mg | ORAL_CAPSULE | Freq: Two times a day (BID) | ORAL | Status: DC
  Administered 2021-10-06 – 2021-10-07 (×2): 100 mg via ORAL
  Filled 2021-10-06 (×2): qty 1

## 2021-10-06 MED ORDER — 0.9 % SODIUM CHLORIDE (POUR BTL) OPTIME
TOPICAL | Status: DC | PRN
Start: 2021-10-06 — End: 2021-10-06
  Administered 2021-10-06: 1000 mL

## 2021-10-06 MED ORDER — CHLORHEXIDINE GLUCONATE 0.12 % MT SOLN
15.0000 mL | Freq: Once | OROMUCOSAL | Status: AC
  Administered 2021-10-06: 15 mL via OROMUCOSAL

## 2021-10-06 MED ORDER — FENTANYL CITRATE (PF) 100 MCG/2ML IJ SOLN
INTRAMUSCULAR | Status: DC | PRN
  Administered 2021-10-06: 25 ug via INTRAVENOUS
  Administered 2021-10-06: 100 ug via INTRAVENOUS

## 2021-10-06 MED ORDER — BUPIVACAINE LIPOSOME 1.3 % IJ SUSP
INTRAMUSCULAR | Status: DC | PRN
  Administered 2021-10-06: 133 mg via PERINEURAL

## 2021-10-06 MED ORDER — ACETAMINOPHEN 10 MG/ML IV SOLN
INTRAVENOUS | Status: AC
  Filled 2021-10-06: qty 100

## 2021-10-06 MED ORDER — THROMBIN 5000 UNITS EX SOLR
CUTANEOUS | Status: DC | PRN
  Administered 2021-10-06: 5000 [IU] via TOPICAL

## 2021-10-06 MED ORDER — DEXAMETHASONE SODIUM PHOSPHATE 10 MG/ML IJ SOLN
INTRAMUSCULAR | Status: DC | PRN
  Administered 2021-10-06: 4 mg via INTRAVENOUS

## 2021-10-06 MED ORDER — MIDAZOLAM HCL 5 MG/5ML IJ SOLN
INTRAMUSCULAR | Status: DC | PRN
Start: 2021-10-06 — End: 2021-10-06
  Administered 2021-10-06: 2 mg via INTRAVENOUS

## 2021-10-06 MED ORDER — TRANEXAMIC ACID-NACL 1000-0.7 MG/100ML-% IV SOLN
INTRAVENOUS | Status: AC
  Filled 2021-10-06: qty 100

## 2021-10-06 MED ORDER — OXYCODONE HCL 5 MG/5ML PO SOLN: 5.0000 mg | Freq: Once | ORAL | Status: DC | PRN

## 2021-10-06 MED ORDER — MIDAZOLAM HCL 2 MG/2ML IJ SOLN
INTRAMUSCULAR | Status: AC
  Filled 2021-10-06: qty 2

## 2021-10-06 MED ORDER — PRAVASTATIN SODIUM 20 MG PO TABS
80.0000 mg | ORAL_TABLET | Freq: Every day | ORAL | Status: DC
  Administered 2021-10-07: 80 mg via ORAL
  Filled 2021-10-06: qty 4

## 2021-10-06 MED ORDER — METOCLOPRAMIDE HCL 5 MG PO TABS: 5.0000 mg | ORAL_TABLET | Freq: Three times a day (TID) | ORAL | Status: DC | PRN

## 2021-10-06 MED ORDER — PHENYLEPHRINE 80 MCG/ML (10ML) SYRINGE FOR IV PUSH (FOR BLOOD PRESSURE SUPPORT)
PREFILLED_SYRINGE | INTRAVENOUS | Status: DC | PRN
  Administered 2021-10-06: 80 ug via INTRAVENOUS
  Administered 2021-10-06: 40 ug via INTRAVENOUS
  Administered 2021-10-06: 80 ug via INTRAVENOUS
  Administered 2021-10-06: 40 ug via INTRAVENOUS
  Administered 2021-10-06: 80 ug via INTRAVENOUS

## 2021-10-06 MED ORDER — FENTANYL CITRATE PF 50 MCG/ML IJ SOSY
PREFILLED_SYRINGE | INTRAMUSCULAR | Status: AC
  Administered 2021-10-06: 50 ug via INTRAVENOUS
  Filled 2021-10-06: qty 3

## 2021-10-06 MED ORDER — ORAL CARE MOUTH RINSE: 15.0000 mL | Freq: Once | OROMUCOSAL | Status: AC

## 2021-10-06 MED ORDER — MENTHOL 3 MG MT LOZG: 1.0000 | LOZENGE | OROMUCOSAL | Status: DC | PRN

## 2021-10-06 MED ORDER — PROPOFOL 10 MG/ML IV BOLUS
INTRAVENOUS | Status: AC
  Filled 2021-10-06: qty 20

## 2021-10-06 MED ORDER — BUPIVACAINE-EPINEPHRINE (PF) 0.5% -1:200000 IJ SOLN
INTRAMUSCULAR | Status: DC | PRN
  Administered 2021-10-06: 15 mL via PERINEURAL

## 2021-10-06 MED ORDER — BUPIVACAINE-EPINEPHRINE (PF) 0.25% -1:200000 IJ SOLN
INTRAMUSCULAR | Status: AC
  Filled 2021-10-06: qty 30

## 2021-10-06 MED ORDER — OXYCODONE HCL 5 MG PO TABS
5.0000 mg | ORAL_TABLET | Freq: Three times a day (TID) | ORAL | Status: DC | PRN
  Administered 2021-10-06 – 2021-10-07 (×3): 5 mg via ORAL
  Filled 2021-10-06 (×3): qty 1

## 2021-10-06 MED ORDER — PROPOFOL 10 MG/ML IV BOLUS
INTRAVENOUS | Status: DC | PRN
  Administered 2021-10-06: 140 mg via INTRAVENOUS
  Administered 2021-10-06: 20 mg via INTRAVENOUS

## 2021-10-06 MED ORDER — ACETAMINOPHEN 160 MG/5ML PO SOLN: 1000.0000 mg | Freq: Once | ORAL | Status: DC | PRN

## 2021-10-06 MED ORDER — VITAMIN D (ERGOCALCIFEROL) 1.25 MG (50000 UNIT) PO CAPS: 50000.0000 [IU] | ORAL_CAPSULE | ORAL | Status: DC

## 2021-10-06 MED ORDER — OXYCODONE-ACETAMINOPHEN 5-325 MG PO TABS: 1.0000 | ORAL_TABLET | Freq: Three times a day (TID) | ORAL | Status: DC | PRN

## 2021-10-06 SURGICAL SUPPLY — 65 items
AID PSTN UNV HD RSTRNT DISP (MISCELLANEOUS) ×1
BAG COUNTER SPONGE SURGICOUNT (BAG) ×1 IMPLANT
BAG SPEC THK2 15X12 ZIP CLS (MISCELLANEOUS)
BAG SPNG CNTER NS LX DISP (BAG) ×1
BAG ZIPLOCK 12X15 (MISCELLANEOUS) IMPLANT
BIT DRILL 1.6MX128 (BIT) ×2 IMPLANT
BLADE SAG 18X100X1.27 (BLADE) ×2 IMPLANT
BODY ANATOMIC PROXIMAL SZ10 (Shoulder) ×1 IMPLANT
CEMENT HV SMART SET (Cement) ×2 IMPLANT
COVER BACK TABLE 60X90IN (DRAPES) ×2 IMPLANT
COVER SURGICAL LIGHT HANDLE (MISCELLANEOUS) ×2 IMPLANT
DRAPE INCISE IOBAN 66X45 STRL (DRAPES) ×2 IMPLANT
DRAPE ORTHO SPLIT 77X108 STRL (DRAPES) ×4
DRAPE SHEET LG 3/4 BI-LAMINATE (DRAPES) ×2 IMPLANT
DRAPE SURG ORHT 6 SPLT 77X108 (DRAPES) ×2 IMPLANT
DRAPE U-SHAPE 47X51 STRL (DRAPES) ×2 IMPLANT
DRSG ADAPTIC 3X8 NADH LF (GAUZE/BANDAGES/DRESSINGS) ×2 IMPLANT
DRSG PAD ABDOMINAL 8X10 ST (GAUZE/BANDAGES/DRESSINGS) ×2 IMPLANT
DURAPREP 26ML APPLICATOR (WOUND CARE) ×2 IMPLANT
ELECT BLADE TIP CTD 4 INCH (ELECTRODE) ×2 IMPLANT
ELECT NDL TIP 2.8 STRL (NEEDLE) ×1 IMPLANT
ELECT NEEDLE TIP 2.8 STRL (NEEDLE) ×2 IMPLANT
ELECT REM PT RETURN 15FT ADLT (MISCELLANEOUS) ×2 IMPLANT
GAUZE SPONGE 4X4 12PLY STRL (GAUZE/BANDAGES/DRESSINGS) ×2 IMPLANT
GLENOID ANCHOR PEG CROSSLK 44 (Orthopedic Implant) ×1 IMPLANT
GLOVE BIOGEL PI IND STRL 7.5 (GLOVE) ×1 IMPLANT
GLOVE BIOGEL PI IND STRL 8.5 (GLOVE) ×1 IMPLANT
GLOVE BIOGEL PI INDICATOR 7.5 (GLOVE) ×1
GLOVE BIOGEL PI INDICATOR 8.5 (GLOVE) ×1
GLOVE ORTHO TXT STRL SZ7.5 (GLOVE) ×2 IMPLANT
GLOVE SURG ORTHO 8.5 STRL (GLOVE) ×2 IMPLANT
GOWN STRL REUS W/ TWL XL LVL3 (GOWN DISPOSABLE) ×2 IMPLANT
GOWN STRL REUS W/TWL XL LVL3 (GOWN DISPOSABLE) ×4
HEAD ECC HUMERAL SZ 48MMX18MM (Head) ×1 IMPLANT
KIT BASIN OR (CUSTOM PROCEDURE TRAY) ×2 IMPLANT
KIT TURNOVER KIT A (KITS) ×1 IMPLANT
MANIFOLD NEPTUNE II (INSTRUMENTS) ×2 IMPLANT
NDL MAYO CATGUT SZ4 TPR NDL (NEEDLE) ×1 IMPLANT
NEEDLE MAYO CATGUT SZ4 (NEEDLE) ×2 IMPLANT
PACK SHOULDER (CUSTOM PROCEDURE TRAY) ×2 IMPLANT
PASSER SUT SWANSON 36MM LOOP (INSTRUMENTS) IMPLANT
PIN METAGLENE 2.5 (PIN) ×1 IMPLANT
PROTECTOR NERVE ULNAR (MISCELLANEOUS) ×1 IMPLANT
RESTRAINT HEAD UNIVERSAL NS (MISCELLANEOUS) ×2 IMPLANT
SLING ARM FOAM STRAP LRG (SOFTGOODS) ×2 IMPLANT
SMARTMIX MINI TOWER (MISCELLANEOUS) ×2
SPIKE FLUID TRANSFER (MISCELLANEOUS) ×2 IMPLANT
SPONGE SURGIFOAM ABS GEL 12-7 (HEMOSTASIS) ×2 IMPLANT
SPONGE T-LAP 18X18 ~~LOC~~+RFID (SPONGE) ×2 IMPLANT
SPONGE T-LAP 4X18 ~~LOC~~+RFID (SPONGE) ×4 IMPLANT
STEM STANDARD SZ 10 113MM (Stem) ×2 IMPLANT
STEM STD SZ 10 113MM (Stem) IMPLANT
STRIP CLOSURE SKIN 1/2X4 (GAUZE/BANDAGES/DRESSINGS) ×2 IMPLANT
SUCTION FRAZIER HANDLE 12FR (TUBING) ×2
SUCTION TUBE FRAZIER 12FR DISP (TUBING) ×1 IMPLANT
SUT FIBERWIRE #2 38 T-5 BLUE (SUTURE) ×12
SUT MNCRL AB 4-0 PS2 18 (SUTURE) ×2 IMPLANT
SUT VIC AB 0 CT1 36 (SUTURE) ×2 IMPLANT
SUT VIC AB 0 CT2 27 (SUTURE) ×2 IMPLANT
SUT VIC AB 2-0 CT1 27 (SUTURE) ×2
SUT VIC AB 2-0 CT1 TAPERPNT 27 (SUTURE) ×1 IMPLANT
SUTURE FIBERWR #2 38 T-5 BLUE (SUTURE) ×4 IMPLANT
TAPE CLOTH SURG 6X10 WHT LF (GAUZE/BANDAGES/DRESSINGS) ×1 IMPLANT
TOWEL OR 17X26 10 PK STRL BLUE (TOWEL DISPOSABLE) ×2 IMPLANT
TOWER SMARTMIX MINI (MISCELLANEOUS) ×1 IMPLANT

## 2021-10-06 NOTE — Discharge Instructions (Signed)
Ice to the shoulder constantly.  Keep the incision covered and clean and dry for one week, then ok to get it wet in the shower. ? ?Do exercise as instructed several times per day. Ok to use the hand while seated for gentle hand to face tasks like eating ? ?DO NOT reach behind your back or push up out of a chair with the operative arm. ? ?Use a sling while you are up and around for comfort, may remove while seated.  Keep pillow propped behind the operative elbow. ? ?Follow up with Dr Veverly Fells in two weeks in the office, call [phone] for appt  ?

## 2021-10-06 NOTE — Brief Op Note (Signed)
10/06/2021 ? ?9:43 AM ? ?PATIENT:  Olivia Schmidt  61 y.o. female ? ?PRE-OPERATIVE DIAGNOSIS:  right shoulder osteoarthritis, end stage ? ?POST-OPERATIVE DIAGNOSIS:  right shoulder osteoarthritis, end stage ? ?PROCEDURE:  Procedure(s) with comments: ?TOTAL SHOULDER ARTHROPLASTY (Right) - with ISB Anatomic TSA - Depuy [employer] Unite with APG glenoid ? ?SURGEON:  Surgeon(s) and Role: ?   Netta Cedars, MD - Primary ? ?PHYSICIAN ASSISTANT:  ? ?ASSISTANTS: Ventura Bruns, PA-C  ? ?ANESTHESIA:   regional and general ? ?EBL:  100 mL  ? ?BLOOD ADMINISTERED:none ? ?DRAINS: none  ? ?LOCAL MEDICATIONS USED:  MARCAINE    ? ?SPECIMEN:  No Specimen ? ?DISPOSITION OF SPECIMEN:  none ? ?COUNTS:  YES ? ?TOURNIQUET:  * No tourniquets in log * ? ?DICTATION: .Other Dictation: Dictation Number [id number] ? ?PLAN OF CARE: Admit for overnight observation ? ?PATIENT DISPOSITION:  PACU - hemodynamically stable. ?  ?Delay start of Pharmacological VTE agent (>24hrs) due to surgical blood loss or risk of bleeding: n/a ? ?

## 2021-10-06 NOTE — Plan of Care (Signed)
  Problem: Education: Goal: Knowledge of General Education information will improve Description Including pain rating scale, medication(s)/side effects and non-pharmacologic comfort measures Outcome: Progressing   Problem: Activity: Goal: Risk for activity intolerance will decrease Outcome: Progressing   Problem: Safety: Goal: Ability to remain free from injury will improve Outcome: Progressing   

## 2021-10-06 NOTE — Anesthesia Procedure Notes (Signed)
Anesthesia Regional Block: Interscalene brachial plexus block  ? ?Pre-Anesthetic Checklist: , timeout performed,  Correct Patient, Correct Site, Correct Laterality,  Correct Procedure, Correct Position, site marked,  Risks and benefits discussed,  Surgical consent,  Pre-op evaluation,  At surgeon's request and post-op pain management ? ?Laterality: Right and Upper ? ?Prep: chloraprep     ?  ?Needles:  ?Injection technique: Single-shot ? ?  ? ? ?Needle Length: 5cm  ?Needle Gauge: 22  ? ? ? ?Additional Needles: ?Arrow? StimuQuik? ECHO Echogenic Stimulating PNB Needle ? ?Procedures:,,,, ultrasound used (permanent image in chart),,    ?Narrative:  ?Start time: 10/06/2021 7:16 AM ?End time: 10/06/2021 7:23 AM ?Injection made incrementally with aspirations every 5 mL. ? ?Performed by: Personally  ?Anesthesiologist: Oleta Mouse, MD ? ? ? ? ?

## 2021-10-06 NOTE — Anesthesia Procedure Notes (Signed)
Procedure Name: Intubation ?Date/Time: 10/06/2021 7:47 AM ?Performed by: West Pugh, CRNA ?Pre-anesthesia Checklist: Patient identified, Emergency Drugs available, Suction available, Patient being monitored and Timeout performed ?Patient Re-evaluated:Patient Re-evaluated prior to induction ?Oxygen Delivery Method: Circle system utilized ?Preoxygenation: Pre-oxygenation with 100% oxygen ?Induction Type: IV induction ?Ventilation: Mask ventilation without difficulty ?Laryngoscope Size: Mac and 4 ?Grade View: Grade II ?Tube type: Oral ?Tube size: 7.0 mm ?Number of attempts: 1 ?Airway Equipment and Method: Stylet ?Placement Confirmation: ETT inserted through vocal cords under direct vision, positive ETCO2, CO2 detector and breath sounds checked- equal and bilateral ?Secured at: 21 cm ?Tube secured with: Tape ?Dental Injury: Teeth and Oropharynx as per pre-operative assessment  ? ? ? ? ?

## 2021-10-06 NOTE — Anesthesia Postprocedure Evaluation (Signed)
Anesthesia Post Note ? ?Patient: Olivia Schmidt ? ?Procedure(s) Performed: TOTAL SHOULDER ARTHROPLASTY (Right: Shoulder) ? ?  ? ?Patient location during evaluation: PACU ?Anesthesia Type: Regional and General ?Level of consciousness: awake and alert ?Pain management: pain level controlled ?Vital Signs Assessment: post-procedure vital signs reviewed and stable ?Respiratory status: spontaneous breathing, nonlabored ventilation, respiratory function stable and patient connected to nasal cannula oxygen ?Cardiovascular status: blood pressure returned to baseline and stable ?Postop Assessment: no apparent nausea or vomiting ?Anesthetic complications: no ? ? ?No notable events documented. ? ?Last Vitals:  ?Vitals:  ? 10/06/21 1041 10/06/21 1100  ?BP:  (!) 156/76  ?Pulse: 69 74  ?Resp: 19 16  ?Temp:  36.7 ?C  ?SpO2: 97% 92%  ?  ?Last Pain:  ?Vitals:  ? 10/06/21 1347  ?TempSrc:   ?PainSc: 0-No pain  ? ? ?  ?  ?  ?  ?  ?  ? ?Olivia Schmidt ? ? ? ? ?

## 2021-10-06 NOTE — Anesthesia Preprocedure Evaluation (Signed)
Anesthesia Evaluation  ?Patient identified by MRN, date of birth, ID band ?Patient awake ? ? ? ?Reviewed: ?Allergy & Precautions, NPO status , Patient's Chart, lab work & pertinent test results ? ?History of Anesthesia Complications ?Negative for: history of anesthetic complications ? ?Airway ?Mallampati: II ? ?TM Distance: >3 FB ?Neck ROM: Full ? ? ? Dental ? ?(+) Edentulous Upper, Missing, Dental Advisory Given ?  ?Pulmonary ?neg shortness of breath, neg sleep apnea, neg COPD, neg recent URI, former smoker,  ?  ?breath sounds clear to auscultation ? ? ? ? ? ? Cardiovascular ?hypertension, Pt. on medications ?(-) angina(-) Past MI and (-) CHF  ?Rhythm:Regular  ? ?  ?Neuro/Psych ?negative neurological ROS ? negative psych ROS  ? GI/Hepatic ?negative GI ROS, Neg liver ROS,   ?Endo/Other  ?negative endocrine ROS ? Renal/GU ?negative Renal ROS  ? ?  ?Musculoskeletal ? ?(+) Arthritis , Osteoarthritis,  right shoulder osteoarthritis  ? Abdominal ?  ?Peds ? Hematology ? ?(+) Blood dyscrasia, anemia , Lab Results ?     Component                Value               Date                 ?     WBC                      5.1                 09/25/2021           ?     HGB                      11.6 (L)            09/25/2021           ?     HCT                      37.4                09/25/2021           ?     MCV                      91.2                09/25/2021           ?     PLT                      181                 09/25/2021           ?   ?Anesthesia Other Findings ? ? Reproductive/Obstetrics ? ?  ? ? ? ? ? ? ? ? ? ? ? ? ? ?  ?  ? ? ? ? ? ? ? ? ?Anesthesia Physical ?Anesthesia Plan ? ?ASA: 2 ? ?Anesthesia Plan: General and Regional  ? ?Post-op Pain Management: Regional block* and Ofirmev IV (intra-op)*  ? ?Induction: Intravenous ? ?PONV Risk Score and Plan: 3 and Ondansetron and Dexamethasone ? ?Airway Management Planned: Oral ETT ? ?Additional Equipment: None ? ?Intra-op Plan:   ? ?Post-operative Plan: Extubation in OR ? ?Informed Consent: I have reviewed  the patients History and Physical, chart, labs and discussed the procedure including the risks, benefits and alternatives for the proposed anesthesia with the patient or authorized representative who has indicated his/her understanding and acceptance.  ? ? ? ?Dental advisory given ? ?Plan Discussed with: CRNA ? ?Anesthesia Plan Comments:   ? ? ? ? ? ? ?Anesthesia Quick Evaluation ? ?

## 2021-10-06 NOTE — Op Note (Signed)
NAME: Barstow, Denece E. ?MEDICAL RECORD NO: [id number] ?ACCOUNT NO: [id number] ?DATE OF BIRTH: 02-Feb-1961 ?FACILITY: [facility] ?LOCATION: [facility] ?PHYSICIAN: Doran Heater. Veverly Fells, MD ? ?Operative Report  ? ?DATE OF PROCEDURE: 10/06/2021 ? ?PREOPERATIVE DIAGNOSIS:  Right shoulder end-stage arthritis. ? ?POSTOPERATIVE DIAGNOSIS:  Right shoulder end-stage arthritis. ? ?SURGICAL PROCEDURE:  Right anatomic shoulder replacement using DePuy Global Unite system with APG glenoid. ? ?ATTENDING SURGEON:  Doran Heater. Veverly Fells, MD ? ?ASSISTANT:  Darol Destine, [place], who was scrubbed during the entire procedure, and necessary for satisfactory completion of surgery. ? ?ANESTHESIA:  General anesthesia was used plus interscalene block. ? ?ESTIMATED BLOOD LOSS:  Less than 100 mL. ? ?FLUID REPLACEMENT:  1500 mL crystalloid. ? ?Instrument counts correct.  No complications.  Perioperative antibiotics were given. ? ?INDICATIONS:  The patient is a 61 year old female who presents with a history of worsening right shoulder pain due to bone-on-bone arthritis.  The patient has failed conservative management, desires operative treatment to relieve pain and restore  ?function.  Informed consent obtained. ? ?DESCRIPTION OF PROCEDURE:  After an adequate level of anesthesia was achieved, the patient was positioned in the modified beach chair position.  Right shoulder correctly identified and sterilely prepped and draped in the usual manner.  Timeout called,  ?verifying correct patient, correct site.  We entered the patient's shoulder using standard deltopectoral approach, starting at the coracoid process extending down to the anterior humerus.  Dissection down through subcutaneous tissues using Bovie.  We  ?identified the cephalic vein and took that laterally with the deltoid pectoralis was taken medially.  Conjoined tendon identified and retracted medially.  We placed our deep retractors.  We tenodesed the biceps in situ using 0 Vicryl figure-of-eight   ?suture.  We next released the subscapularis subperiosteally off the lesser tuberosity.  We tagged with #2 FiberWire suture in a modified Mason-Allen suture technique for repair at the end.  We progressively externally rotated and released the inferior  ?capsule.  We delivered the humerus into a position where we could resect the humeral head.  There was bone-on-bone wear.  We placed our T-handle [place]o over the dorsal aspect just under the biceps to protect the rotator cuff and then a reverse Hohmann  ?medially.  We then placed the elbow at the patient's side, externally rotating about 30 degrees and then performed our head cut using the head cut guide for the 135 cut.  We then performed our cut with the oscillating saw, anterior to posterior right at  ?the attachment point for the rotator cuff.  Once we had the head removed, we went ahead and removed the inferior osteophytes off the humeral neck progressively externally rotating using a rongeur.  We then went to the glenoid side.  We removed the biceps ? attachment at the labrum.  We removed the remaining cartilage, which was not much on the glenoid face.  We placed our deep retractors, had good exposure of the glenoid face.  We placed our guide pin for reaming and reamed for the 44 APG glenoid by  ?DePuy.  Once we had that reaming done, we did our peripheral hand reaming.  We then drilled our central peg hole, placed our guide for our 3 peripheral holes, centered on the 6 o'clock scapular neck position.  We drilled our superior hole and two  ?inferior holes.  We trialed with a 44 APG trial and we were happy with that bony support and stability of the implant.  We removed the trial.  We then  placed Gelfoam soaked in thrombin in the 3 peripheral holes vacuum mixed high viscosity cement on the  ?back table and then placed cement after drying those holes and placed cement in the 3 peripheral holes and then the central peg was left without cement.  We impacted the  implant into place and held that until all cement was hardened on the back table.   ?We then checked to make sure the glenoid was stable, which it was.  We went to the humeral side, we reamed up to a size 10 and then we broached for the 10 Global Unite stem and once we had that trial stem in place, we selected an 48, 18 eccentric humeral ? head and placed that with best coverage on the cut edge of the surface of the humeral head and neck area.  We reduced the shoulder, had excellent soft tissue balancing, able to translate inferiorly and posteriorly and 50% with immediate relocation.  We  ?removed the trial components.  We irrigated thoroughly.  I drilled holes and placed #2 FiberWire suture in the lesser tuberosity for repair of the subscap.  We then used available bone graft from the humeral head in impaction grafting technique and  ?impacted the press-fit [place] stem, which was a 10 stem with a 10 metaphysis, placed in 30 degrees of retroversion.  Once we had that in place, we selected the real 48, 18 eccentric head and placed that dialed posterosuperiorly and impacted that on the ? trunnion.  We had a good stable head, which had good coverage.  We reduced the shoulder and again excellent soft tissue balancing.  We irrigated thoroughly and then repaired the subscapularis anatomically back to the lesser tuberosity with suture  ?through bone including rotator interval repair.  We had excellent range of motion.  At this point, we irrigated again and closed the deltopectoral interval with 0 Vicryl suture followed by 2-0 Vicryl for subcutaneous closure and 4-0 Monocryl for skin.   ?Steri-Strips were applied followed by sterile dressing.  The patient tolerated surgery well. ? ? ?PUS ?D: 10/06/2021 9:55:55 am T: 10/06/2021 11:17:00 am  ?JOB: [id number]/ [id number]  ?

## 2021-10-06 NOTE — Transfer of Care (Signed)
Immediate Anesthesia Transfer of Care Note ? ?Patient: Olivia Schmidt ? ?Procedure(s) Performed: TOTAL SHOULDER ARTHROPLASTY (Right: Shoulder) ? ?Patient Location: PACU ? ?Anesthesia Type:GA combined with regional for post-op pain ? ?Level of Consciousness: awake, alert  and patient cooperative ? ?Airway & Oxygen Therapy: Patient Spontanous Breathing and Patient connected to nasal cannula oxygen ? ?Post-op Assessment: Report given to RN and Post -op Vital signs reviewed and stable ? ?Post vital signs: Reviewed and stable ? ?Last Vitals:  ?Vitals Value Taken Time  ?BP    ?Temp    ?Pulse 78 10/06/21 0950  ?Resp 14 10/06/21 0950  ?SpO2 94 % 10/06/21 0950  ?Vitals shown include unvalidated device data. ? ?Last Pain:  ?Vitals:  ? 10/06/21 0558  ?TempSrc: Oral  ?PainSc:   ?   ? ?Patients Stated Pain Goal: 4 (10/06/21 0555) ? ?Complications: No notable events documented. ?

## 2021-10-06 NOTE — Interval H&P Note (Signed)
History and Physical Interval Note: ? ?10/06/2021 ?7:30 AM ? ?Olivia Schmidt  has presented today for surgery, with the diagnosis of right shoulder osteoarthritis.  The various methods of treatment have been discussed with the patient and family. After consideration of risks, benefits and other options for treatment, the patient has consented to  Procedure(s) with comments: ?TOTAL SHOULDER ARTHROPLASTY (Right) - with ISB as a surgical intervention.  The patient's history has been reviewed, patient examined, no change in status, stable for surgery.  I have reviewed the patient's chart and labs.  Questions were answered to the patient's satisfaction.   ? ? ?Augustin Schooling ? ? ?

## 2021-10-07 ENCOUNTER — Encounter (HOSPITAL_COMMUNITY): Payer: Self-pay | Admitting: Orthopedic Surgery

## 2021-10-07 DIAGNOSIS — M19011 Primary osteoarthritis, right shoulder: Secondary | ICD-10-CM | POA: Diagnosis not present

## 2021-10-07 LAB — BASIC METABOLIC PANEL
Anion gap: 6 (ref 5–15)
BUN: 15 mg/dL (ref 6–20)
CO2: 26 mmol/L (ref 22–32)
Calcium: 8.5 mg/dL — ABNORMAL LOW (ref 8.9–10.3)
Chloride: 105 mmol/L (ref 98–111)
Creatinine, Ser: 0.78 mg/dL (ref 0.44–1.00)
GFR, Estimated: >60 mL/min (ref >60)
Glucose, Bld: 130 mg/dL — ABNORMAL HIGH (ref 70–99)
Potassium: 4.3 mmol/L (ref 3.5–5.1)
Sodium: 137 mmol/L (ref 135–145)

## 2021-10-07 LAB — HEMOGLOBIN AND HEMATOCRIT, BLOOD
HCT: 36 % (ref 36.0–46.0)
Hemoglobin: 11.4 g/dL — ABNORMAL LOW (ref 12.0–15.0)

## 2021-10-07 MED ORDER — ALBUTEROL SULFATE (2.5 MG/3ML) 0.083% IN NEBU
2.5000 mg | INHALATION_SOLUTION | RESPIRATORY_TRACT | Status: DC | PRN
  Administered 2021-10-07: 2.5 mg via RESPIRATORY_TRACT
  Filled 2021-10-07: qty 3

## 2021-10-07 NOTE — Plan of Care (Signed)
  Problem: Coping: Goal: Level of anxiety will decrease Outcome: Progressing   Problem: Pain Managment: Goal: General experience of comfort will improve Outcome: Progressing   

## 2021-10-07 NOTE — Evaluation (Addendum)
Occupational Therapy Evaluation ?Patient Details ?Name: Olivia Schmidt ?MRN: [id number] ?DOB: 15-Dec-1960 ?Today's Date: 10/07/2021 ? ? ?History of Present Illness Patient is a 61 year old female who underwent  R TSA on 4/28 wih end stage arthritis. PMH: arthritis, obesity,a fib  ? ?Clinical Impression ?  ?s/p shoulder replacement without functional use of right dominant upper extremity secondary to effects of surgery and interscalene block and shoulder precautions. Therapist provided education and instruction to patient and spouse in regards to exercises, precautions, positioning, donning upper extremity clothing and bathing while maintaining shoulder precautions, ice and edema management and donning/doffing sling. Patient and spouse verbalized understanding and demonstrated as needed. Patient needed assistance to donn shirt, underwear, pants, socks and shoes and provided with instruction on compensatory strategies to perform ADLs. Patient to follow up with MD for further therapy needs.  ?  ?   ? ?Recommendations for follow up therapy are one component of a multi-disciplinary discharge planning process, led by the attending physician.  Recommendations may be updated based on patient status, additional functional criteria and insurance authorization.  ? ?Follow Up Recommendations ? Follow physician's recommendations for discharge plan and follow up therapies  ?  ?Assistance Recommended at Discharge Frequent or constant Supervision/Assistance  ?Patient can return home with the following A little help with walking and/or transfers;A little help with bathing/dressing/bathroom;Assistance with cooking/housework;Direct supervision/assist for financial management;Assist for transportation;Help with stairs or ramp for entrance;Direct supervision/assist for medications management ? ?  ?Functional Status Assessment ? Patient has had a recent decline in their functional status and demonstrates the ability to make  significant improvements in function in a reasonable and predictable amount of time.  ?Equipment Recommendations ? BSC/3in1 (bariatric BSC)  ?  ?Recommendations for Other Services   ? ? ?  ?Precautions / Restrictions Restrictions ?Weight Bearing Restrictions: Yes ?RUE Weight Bearing: Non weight bearing  ? ?  ? ?Mobility Bed Mobility ?Overal bed mobility: Needs Assistance ?Bed Mobility: Supine to Sit ?  ?  ?Supine to sit: Min assist, HOB elevated ?  ?  ?General bed mobility comments: patient needed min A for supine to sit on edge of bed with patient having a adjustable bed at home. paient needed cues for proper sequencing and education on staying on L side of bed. patient and husband verbalized understanding. ?  ? ?Transfers ?  ?  ?  ?  ?  ?  ?  ?  ?  ?  ?  ? ?  ?Balance Overall balance assessment: Mild deficits observed, not formally tested ?  ?  ?  ?  ?  ?  ?  ?  ?  ?  ?  ?  ?  ?  ?  ?  ?  ?  ?   ? ?ADL either performed or assessed with clinical judgement  ? ?ADL Overall ADL's : Needs assistance/impaired ?Eating/Feeding: Set up;Sitting ?  ?Grooming: Dance movement psychotherapist;Wash/dry hands;Standing;Min guard ?  ?Upper Body Bathing: Moderate assistance;Sitting ?  ?Lower Body Bathing: Sit to/from stand;Sitting/lateral leans;Moderate assistance ?Lower Body Bathing Details (indicate cue type and reason): due to body habitus and RUE restrictions. patient indicated L shoulder was also "bone on bone" ?Upper Body Dressing : Maximal assistance;Sitting ?Upper Body Dressing Details (indicate cue type and reason): with husband able to demonstrate understanding of education provided. ?Lower Body Dressing: Sit to/from stand;Moderate assistance;Sitting/lateral leans ?  ?Toilet Transfer: Nature conservation officer;Ambulation ?Toilet Transfer Details (indicate cue type and reason): with no AD wit husband educated on standing on L  side if providing min guard at home ?Toileting- Clothing Manipulation and Hygiene: Moderate assistance ?Toileting -  Clothing Manipulation Details (indicate cue type and reason): patient was able to complete anterior perineal area but needed max A for posterior. patient and husband were educated on toileting wands/buddys patient and husband to look into. ?  ?  ?Functional mobility during ADLs: Supervision/safety ?   ? ? ? ?Vision Patient Visual Report: No change from baseline ?   ?   ?Perception   ?  ?Praxis   ?  ? ?Pertinent Vitals/Pain Pain Assessment ?Pain Assessment: Faces ?Faces Pain Scale: Hurts even more ?Pain Location: R shoulder ?Pain Descriptors / Indicators: Discomfort, Grimacing, Guarding, Operative site guarding ?Pain Intervention(s): Monitored during session, Limited activity within patient's tolerance, Premedicated before session, Repositioned  ? ? ? ?Hand Dominance Right ?  ?Extremity/Trunk Assessment Upper Extremity Assessment ?Upper Extremity Assessment: RUE deficits/detail ?RUE Deficits / Details: R TSA completed with no shoulder ROM allowed. block still in place. patient able to AROM hand hand wrist but not elbow at this time ?  ?Lower Extremity Assessment ?Lower Extremity Assessment: Overall WFL for tasks assessed ?  ?Cervical / Trunk Assessment ?Cervical / Trunk Assessment: Normal ?  ?Communication Communication ?Communication: No difficulties ?  ?Cognition Arousal/Alertness: Awake/alert ?Behavior During Therapy: [facility] for tasks assessed/performed ?Overall Cognitive Status: Within Functional Limits for tasks assessed ?  ?  ?  ?  ?  ?  ?  ?  ?  ?  ?  ?  ?  ?  ?  ?  ?General Comments: patients husband was present as well. ?  ?  ?General Comments  patient was able to maintain O2 saturation 95% or above on RA with activity. ? ?  ?Exercises   ?  ?Shoulder Instructions    ? ? ?Home Living Family/patient expects to be discharged to:: Private residence ?Living Arrangements: Spouse/significant other ?Available Help at Discharge: Family;Available 24 hours/day ?Type of Home: House ?Home Access: Stairs to enter ?Entrance  Stairs-Number of Steps: 2 ?  ?Home Layout: One level ?  ?  ?  ?  ?  ?  ?  ?  ?  ?  ?  ? ?  ?Prior Functioning/Environment Prior Level of Function : Independent/Modified Independent ?  ?  ?  ?  ?  ?  ?  ?  ?  ? ?  ?  ?OT Problem List: Decreased range of motion;Decreased activity tolerance;Impaired balance (sitting and/or standing);Pain;Decreased safety awareness;Decreased knowledge of precautions;Impaired UE functional use;Decreased knowledge of use of DME or AE;Obesity ?  ?   ?OT Treatment/Interventions: Self-care/ADL training;Therapeutic exercise;Energy conservation;DME and/or AE instruction;Patient/family education;Balance training;Therapeutic activities  ?  ?OT Goals(Current goals can be found in the care plan section) Acute Rehab OT Goals ?Patient Stated Goal: to go home today ?OT Goal Formulation: With patient/family ?Time For Goal Achievement: 10/21/21 ?Potential to Achieve Goals: Good  ?OT Frequency: Min 1X/week ?  ? ?Co-evaluation   ?  ?  ?  ?  ? ?  ?AM-PAC OT "6 Clicks" Daily Activity     ?Outcome Measure Help from another person eating meals?: A Little ?Help from another person taking care of personal grooming?: A Little ?Help from another person toileting, which includes using toliet, bedpan, or urinal?: A Lot ?Help from another person bathing (including washing, rinsing, drying)?: A Lot ?Help from another person to put on and taking off regular upper body clothing?: A Lot ?Help from another person to put on and taking off regular lower  body clothing?: A Lot ?6 Click Score: 14 ?  ?End of Session Nurse Communication: Mobility status ? ?Activity Tolerance: Patient tolerated treatment well ?Patient left: in chair;with call bell/phone within reach ? ?OT Visit Diagnosis: Unsteadiness on feet (R26.81);Pain ?Pain - Right/Left: Right ?Pain - part of body: Shoulder  ?              ?Time: 9584-4171 ?OT Time Calculation (min): 43 min ?Charges:  OT General Charges ?$OT Visit: 1 Visit ?OT Evaluation ?$OT Eval Low  Complexity: 1 Low ?OT Treatments ?$Self Care/Home Management : 23-37 mins ? ?Sirron Francesconi OTR/L, MS ?Acute Rehabilitation Department ?Office# [phone] ?Pager# [phone] ? ? ?Feliz Beam Ardena Gangl ?10/07/2021, 10:11 AM ?

## 2021-10-07 NOTE — Progress Notes (Signed)
Discharge complete, patient verbalized understanding of instructions. Awaiting husband to pick her up shortly.  ?

## 2021-10-07 NOTE — Plan of Care (Signed)
  Problem: Education: Goal: Knowledge of General Education information will improve Description: Including pain rating scale, medication(s)/side effects and non-pharmacologic comfort measures Outcome: Progressing   Problem: Activity: Goal: Risk for activity intolerance will decrease Outcome: Progressing   Problem: Pain Managment: Goal: General experience of comfort will improve Outcome: Progressing   

## 2021-10-07 NOTE — Progress Notes (Signed)
Orthopedics Progress Note ? ?Subjective: ?Patient reports some coughing and wheezing. She would like a breathing treatment ? ?Objective: ? ?Vitals:  ? 10/07/21 0145 10/07/21 0422  ?BP: 137/79 134/69  ?Pulse: 71 79  ?Resp: 18 17  ?Temp: 98.3 ?F (36.8 ?C) 98.1 ?F (36.7 ?C)  ?SpO2: 96% 99%  ? ? ?General: Awake and alert  ?Musculoskeletal: Right shoulder dressing changed. Wound benign ?Neurovascularly intact ? ?Lab Results  ?Component Value Date  ? WBC 5.1 09/25/2021  ? HGB 11.4 (L) 10/07/2021  ? HCT 36.0 10/07/2021  ? MCV 91.2 09/25/2021  ? PLT 181 09/25/2021  ? ? ?   ?Component Value Date/Time  ? NA 137 10/07/2021 0253  ? K 4.3 10/07/2021 0253  ? CL 105 10/07/2021 0253  ? CO2 26 10/07/2021 0253  ? GLUCOSE 130 (H) 10/07/2021 0253  ? BUN 15 10/07/2021 0253  ? CREATININE 0.78 10/07/2021 0253  ? CALCIUM 8.5 (L) 10/07/2021 0253  ? GFRNONAA >60 10/07/2021 0253  ? GFRAA >60 10/27/2015 1205  ? ? ?No results found for: INR, PROTIME ? ?Assessment/Plan: ?POD #1 s/p Procedure(s): ?TOTAL SHOULDER ARTHROPLASTY ?Needs aggressive incentive spirometry ?Albuterol Nebs ordered ?Will see how she is doing later today for possible D/C home ? ?Doran Heater. Veverly Fells, MD ?10/07/2021 ?8:07 AM ? ?  ?

## 2021-10-07 NOTE — Discharge Summary (Signed)
? ? ?In most cases prophylactic antibiotics for Dental procdeures after total joint surgery are not necessary. ? ?Exceptions are as follows: ? ?1. History of prior total joint infection ? ?2. Severely immunocompromised (Organ Transplant, cancer chemotherapy, Rheumatoid biologic ?meds such as [place]) ? ?3. Poorly controlled diabetes (A1C &gt; 8.0, blood glucose over 200) ? ?If you have one of these conditions, contact your surgeon for an antibiotic prescription, prior to your ?dental procedure. Orthopedic Discharge Summary ? ? ? ?  ? ? ?Physician Discharge Summary  ?Patient ID: ?Olivia Schmidt ?MRN: [id number] ?DOB/AGE: 11/03/1960 61 y.o. ? ?Admit date: 10/06/2021 ?Discharge date: 10/07/2021  ? ?Procedures:  ?Procedure(s) (LRB): ?TOTAL SHOULDER ARTHROPLASTY (Right) ? ?Attending Physician:  Dr. Esmond Plants ? ?Admission Diagnoses:   right shoulder OA ? ?Discharge Diagnoses:  same ? ? ?Past Medical History:  ?Diagnosis Date  ? Acute bronchitis 05/08/2015  ? Anemia 05/07/2015  ? Atrial fibrillation with rapid ventricular response ([place])   ? Chest pain 05/07/2015  ? FEMALE INFERTILITY 10/21/2008  ? Qualifier: Diagnosis of  By: Amil Amen MD, Benjamine Mola    ? Fibroids   ? FIBROIDS, UTERUS 07/11/2009  ? Qualifier: Diagnosis of  By: Amil Amen MD, Benjamine Mola    ? Goiter   ? Hypertension   ? Hyperthyroidism 05/07/2015  ? Hypokalemia 05/08/2015  ? Hypomagnesemia 05/08/2015  ? PAF (paroxysmal atrial fibrillation) ([place]) 05/09/2015  ? Thrombocytopenia ([place]) 05/09/2015  ? ? ?PCP: [name], NP  ? ?Discharged Condition: good ? ?Hospital Course:  Patient underwent the above stated procedure on 10/06/2021. Patient tolerated the procedure well and brought to the recovery room in good condition and subsequently to the floor. Patient had an uncomplicated hospital course and was stable for discharge. ? ? ?Disposition: Discharge disposition: 01-Home or Self Care ? ? ? ? ? with follow up in 2 weeks ? ? ? Follow-up Information   ? ?  Netta Cedars, MD. Call in 2 week(s).   ?Specialty: Orthopedic Surgery ?Why: call [phone] for appt ?Contact information: ?[address] ?[address] ?[place] [address] ?[phone] ? ? ?  ?  ? ?  ?  ? ?  ? ? ?Dental Antibiotics: ? ?In most cases prophylactic antibiotics for Dental procdeures after total joint surgery are not necessary. ? ?Exceptions are as follows: ? ?1. History of prior total joint infection ? ?2. Severely immunocompromised (Organ Transplant, cancer chemotherapy, Rheumatoid biologic ?meds such as [place]) ? ?3. Poorly controlled diabetes (A1C &gt; 8.0, blood glucose over 200) ? ?If you have one of these conditions, contact your surgeon for an antibiotic prescription, prior to your ?dental procedure. ? ?Discharge Instructions   ? ? Call MD / Call 911   Complete by: As directed ?  ? If you experience chest pain or shortness of breath, CALL 911 and be transported to the hospital emergency room.  If you develope a fever above 101 F, pus (white drainage) or increased drainage or redness at the wound, or calf pain, call your surgeon's office.  ? Constipation Prevention   Complete by: As directed ?  ? Drink plenty of fluids.  Prune juice may be helpful.  You may use a stool softener, such as Colace (over the counter) 100 mg twice a day.  Use MiraLax (over the counter) for constipation as needed.  ? Diet - low sodium heart healthy   Complete by: As directed ?  ? Increase activity slowly as tolerated   Complete by: As directed ?  ? Post-operative opioid taper instructions:  Complete by: As directed ?  ? POST-OPERATIVE OPIOID TAPER INSTRUCTIONS: ?It is important to wean off of your opioid medication as soon as possible. If you do not need pain medication after your surgery it is ok to stop day one. ?Opioids include: ?Codeine, Hydrocodone(Norco, Vicodin), Oxycodone(Percocet, oxycontin) and hydromorphone amongst others.  ?Long term and even short term use of opiods can cause: ?Increased pain  response ?Dependence ?Constipation ?Depression ?Respiratory depression ?And more.  ?Withdrawal symptoms can include ?Flu like symptoms ?Nausea, vomiting ?And more ?Techniques to manage these symptoms ?Hydrate well ?Eat regular healthy meals ?Stay active ?Use relaxation techniques(deep breathing, meditating, yoga) ?Do Not substitute Alcohol to help with tapering ?If you have been on opioids for less than two weeks and do not have pain than it is ok to stop all together.  ?Plan to wean off of opioids ?This plan should start within one week post op of your joint replacement. ?Maintain the same interval or time between taking each dose and first decrease the dose.  ?Cut the total daily intake of opioids by one tablet each day ?Next start to increase the time between doses. ?The last dose that should be eliminated is the evening dose.  ? ?  ? ?  ? ? ?Allergies as of 10/07/2021   ?No Known Allergies ?  ? ?  ?Medication List  ?  ? ?TAKE these medications   ? ?acetaminophen 650 MG CR tablet ?Commonly known as: Tylenol 8 Hour ?Take 1 tablet (650 mg total) by mouth every 8 (eight) hours as needed. ?What changed: reasons to take this ?  ?albuterol 108 (90 Base) MCG/ACT inhaler ?Commonly known as: VENTOLIN HFA ?Inhale 1 puff into the lungs every 6 (six) hours as needed for shortness of breath. ?  ?cyclobenzaprine 10 MG tablet ?Commonly known as: FLEXERIL ?Take 1 tablet by mouth 3 times a day as needed for muscle spasms ?  ?ergocalciferol 1.25 MG (50000 UT) capsule ?Commonly known as: VITAMIN D2 ?Take 1 capsule (50,000 Units total) by mouth once a week. ?  ?Vitamin D (Ergocalciferol) 1.25 MG (50000 UNIT) Caps capsule ?Commonly known as: DRISDOL ?Take 1 (one) capsule by mouth weekly ?  ?ergocalciferol 1.25 MG (50000 UT) capsule ?Commonly known as: VITAMIN D2 ?Take 1 capsule by mouth once a week ?  ?lisinopril 10 MG tablet ?Commonly known as: ZESTRIL ?TAKE 1 (ONE) TABLET BY MOUTH IN THE EVENING ?What changed: additional  instructions ?  ?oxyCODONE-acetaminophen 10-325 MG tablet ?Commonly known as: Percocet ?Take 1 tablet by mouth 5 times a day as needed for pain ?  ?Ozempic (1 MG/DOSE) 4 MG/3ML Sopn ?Generic drug: Semaglutide (1 MG/DOSE) ?Inject 1 mg under the skin once a week ?  ?phentermine 37.5 MG tablet ?Commonly known as: ADIPEX-P ?Take 1 (one) Tablet by mouth daily ?  ?pravastatin 80 MG tablet ?Commonly known as: PRAVACHOL ?Take 1 (one) tablet by mouth daily in evening ?  ?topiramate 50 MG tablet ?Commonly known as: TOPAMAX ?Take 1 tablet (50 mg total) by mouth daily. ?  ?topiramate 50 MG tablet ?Commonly known as: TOPAMAX ?Take 1 tablet by mouth daily. ?  ? ?  ? ? ? ? ?Signed: ?Augustin Schooling ?10/07/2021, 8:10 AM ? ?[facility] [employer]  [facility] ?[address], [place], [place], [place] [address] ?Phone: [phone] ?Facebook  Engineer, structural  ? ?  ?

## 2021-10-09 ENCOUNTER — Other Ambulatory Visit (HOSPITAL_COMMUNITY): Payer: Self-pay

## 2021-10-09 ENCOUNTER — Encounter (HOSPITAL_COMMUNITY): Payer: Self-pay | Admitting: Orthopedic Surgery

## 2021-10-09 MED ORDER — CYCLOBENZAPRINE HCL 10 MG PO TABS
ORAL_TABLET | ORAL | 0 refills | Status: DC
  Filled 2021-10-09: qty 90, 30d supply, fill #0

## 2021-10-09 MED ORDER — OXYCODONE-ACETAMINOPHEN 10-325 MG PO TABS
ORAL_TABLET | ORAL | 0 refills | Status: DC
  Filled 2021-10-09: qty 150, 30d supply, fill #0

## 2021-10-19 ENCOUNTER — Other Ambulatory Visit (HOSPITAL_COMMUNITY): Payer: Self-pay

## 2021-10-27 ENCOUNTER — Other Ambulatory Visit (HOSPITAL_COMMUNITY): Payer: Self-pay

## 2021-11-09 ENCOUNTER — Other Ambulatory Visit (HOSPITAL_COMMUNITY): Payer: Self-pay

## 2021-11-09 MED ORDER — NAPROXEN SODIUM ER 375 MG PO TB24
ORAL_TABLET | ORAL | 1 refills | Status: DC
  Filled 2021-11-09: qty 90, 90d supply, fill #0

## 2021-11-09 MED ORDER — OZEMPIC (1 MG/DOSE) 4 MG/3ML ~~LOC~~ SOPN
PEN_INJECTOR | SUBCUTANEOUS | 1 refills | Status: DC
  Filled 2021-11-09: qty 3, 28d supply, fill #0
  Filled 2022-01-01: qty 3, 28d supply, fill #1

## 2021-11-09 MED ORDER — METOPROLOL SUCCINATE ER 25 MG PO TB24
ORAL_TABLET | ORAL | 1 refills | Status: DC
  Filled 2021-11-09: qty 90, 90d supply, fill #0

## 2021-11-09 MED ORDER — METFORMIN HCL 500 MG PO TABS
500.0000 mg | ORAL_TABLET | Freq: Every day | ORAL | 3 refills | Status: DC
  Filled 2021-11-09: qty 30, 30d supply, fill #0

## 2021-11-09 MED ORDER — CYCLOBENZAPRINE HCL 10 MG PO TABS
ORAL_TABLET | ORAL | 0 refills | Status: DC
  Filled 2021-11-09: qty 90, 30d supply, fill #0

## 2021-11-09 MED ORDER — LISINOPRIL 10 MG PO TABS
ORAL_TABLET | ORAL | 1 refills | Status: DC
  Filled 2021-11-09: qty 90, 90d supply, fill #0

## 2021-11-09 MED ORDER — PHENTERMINE HCL 15 MG PO CAPS
ORAL_CAPSULE | ORAL | 0 refills | Status: DC
  Filled 2021-11-09: qty 30, 30d supply, fill #0

## 2021-11-09 MED ORDER — ERGOCALCIFEROL 1.25 MG (50000 UT) PO CAPS
ORAL_CAPSULE | ORAL | 1 refills | Status: DC
  Filled 2021-11-09: qty 12, 84d supply, fill #0

## 2021-11-09 MED ORDER — OXYCODONE-ACETAMINOPHEN 10-325 MG PO TABS
ORAL_TABLET | ORAL | 0 refills | Status: DC
  Filled 2021-11-09: qty 150, 30d supply, fill #0

## 2021-11-10 ENCOUNTER — Other Ambulatory Visit (HOSPITAL_COMMUNITY): Payer: Self-pay

## 2021-11-27 ENCOUNTER — Other Ambulatory Visit (HOSPITAL_COMMUNITY): Payer: Self-pay

## 2021-11-27 MED ORDER — OXYCODONE-ACETAMINOPHEN 10-325 MG PO TABS
ORAL_TABLET | ORAL | 0 refills | Status: DC
  Filled 2021-11-27 – 2021-12-07 (×2): qty 150, 30d supply, fill #0

## 2021-11-27 MED ORDER — PHENTERMINE HCL 37.5 MG PO TABS
ORAL_TABLET | ORAL | 0 refills | Status: DC
  Filled 2021-11-27: qty 30, 30d supply, fill #0

## 2021-11-27 MED ORDER — CYCLOBENZAPRINE HCL 10 MG PO TABS
ORAL_TABLET | ORAL | 0 refills | Status: DC
  Filled 2021-11-27: qty 90, 30d supply, fill #0

## 2021-11-28 ENCOUNTER — Other Ambulatory Visit (HOSPITAL_COMMUNITY): Payer: Self-pay

## 2021-12-01 ENCOUNTER — Other Ambulatory Visit (HOSPITAL_COMMUNITY): Payer: Self-pay

## 2021-12-05 ENCOUNTER — Other Ambulatory Visit (HOSPITAL_COMMUNITY): Payer: Self-pay

## 2021-12-07 ENCOUNTER — Other Ambulatory Visit (HOSPITAL_COMMUNITY): Payer: Self-pay

## 2021-12-26 ENCOUNTER — Other Ambulatory Visit (HOSPITAL_COMMUNITY): Payer: Self-pay

## 2021-12-26 MED ORDER — VITAMIN D (ERGOCALCIFEROL) 1.25 MG (50000 UNIT) PO CAPS
ORAL_CAPSULE | ORAL | 1 refills | Status: DC
  Filled 2021-12-26 – 2022-06-26 (×2): qty 12, 84d supply, fill #0

## 2021-12-26 MED ORDER — PHENTERMINE HCL 37.5 MG PO TABS
ORAL_TABLET | ORAL | 0 refills | Status: DC
  Filled 2021-12-26: qty 30, 30d supply, fill #0

## 2021-12-26 MED ORDER — OXYCODONE-ACETAMINOPHEN 10-325 MG PO TABS
ORAL_TABLET | ORAL | 0 refills | Status: DC
  Filled 2021-12-26 – 2022-01-04 (×2): qty 150, 30d supply, fill #0

## 2021-12-26 MED ORDER — CYCLOBENZAPRINE HCL 10 MG PO TABS
ORAL_TABLET | ORAL | 0 refills | Status: DC
  Filled 2021-12-26: qty 90, 30d supply, fill #0

## 2021-12-26 MED ORDER — NAPROXEN SODIUM ER 375 MG PO TB24
ORAL_TABLET | ORAL | 1 refills | Status: DC
  Filled 2021-12-26: qty 90, 90d supply, fill #0

## 2022-01-01 ENCOUNTER — Other Ambulatory Visit (HOSPITAL_COMMUNITY): Payer: Self-pay

## 2022-01-04 ENCOUNTER — Other Ambulatory Visit (HOSPITAL_COMMUNITY): Payer: Self-pay

## 2022-01-08 ENCOUNTER — Other Ambulatory Visit: Payer: Self-pay

## 2022-01-08 ENCOUNTER — Emergency Department (HOSPITAL_COMMUNITY)
Admission: EM | Admit: 2022-01-08 | Discharge: 2022-01-08 | Disposition: A | Discharge status: Home / Self Care | Payer: BC Managed Care – PPO | Attending: Emergency Medicine | Admitting: Emergency Medicine

## 2022-01-08 ENCOUNTER — Emergency Department (HOSPITAL_COMMUNITY): Payer: BC Managed Care – PPO

## 2022-01-08 ENCOUNTER — Encounter (HOSPITAL_COMMUNITY): Payer: Self-pay

## 2022-01-08 DIAGNOSIS — Z79899 Other long term (current) drug therapy: Secondary | ICD-10-CM | POA: Diagnosis not present

## 2022-01-08 DIAGNOSIS — F17200 Nicotine dependence, unspecified, uncomplicated: Secondary | ICD-10-CM | POA: Diagnosis not present

## 2022-01-08 DIAGNOSIS — I10 Essential (primary) hypertension: Secondary | ICD-10-CM | POA: Insufficient documentation

## 2022-01-08 DIAGNOSIS — R109 Unspecified abdominal pain: Secondary | ICD-10-CM | POA: Insufficient documentation

## 2022-01-08 DIAGNOSIS — M546 Pain in thoracic spine: Secondary | ICD-10-CM | POA: Diagnosis not present

## 2022-01-08 DIAGNOSIS — Z794 Long term (current) use of insulin: Secondary | ICD-10-CM | POA: Diagnosis not present

## 2022-01-08 DIAGNOSIS — M549 Dorsalgia, unspecified: Secondary | ICD-10-CM | POA: Diagnosis present

## 2022-01-08 LAB — URINALYSIS, ROUTINE W REFLEX MICROSCOPIC
Bilirubin Urine: NEGATIVE
Glucose, UA: NEGATIVE mg/dL
Hgb urine dipstick: NEGATIVE
Ketones, ur: NEGATIVE mg/dL
Leukocytes,Ua: NEGATIVE
Nitrite: NEGATIVE
Protein, ur: NEGATIVE mg/dL
Specific Gravity, Urine: 1.018 (ref 1.005–1.030)
pH: 6 (ref 5.0–8.0)

## 2022-01-08 MED ORDER — LIDOCAINE 5 % EX PTCH
1.0000 | MEDICATED_PATCH | Freq: Once | CUTANEOUS | Status: DC
  Administered 2022-01-08: 1 via TRANSDERMAL
  Filled 2022-01-08: qty 1

## 2022-01-08 MED ORDER — LIDOCAINE 5 % EX PTCH: 1.0000 | MEDICATED_PATCH | CUTANEOUS | 0 refills | Status: DC

## 2022-01-08 NOTE — ED Provider Triage Note (Signed)
Emergency Medicine Provider Triage Evaluation Note  Olivia Schmidt , a 61 y.o. female  was evaluated in triage.  Pt complains of flank/ rib pain x 1 week. Worse with movement and walking. Progressively worsening. Took m relaxer and oxycodone with only minimal relief.  No cough , pleuritic pain. No urinary sxs or rash. Rx for oxycodone five x daily. Review of Systems  Positive:  Flank pain  Negative: Fever   Physical Exam  BP (!) 183/73 (BP Location: Left Arm)   Pulse 62   Temp 98.4 F (36.9 C) (Oral)   Resp 16   Ht '5\' 4"'$  (1.626 m)   Wt 117 kg   LMP 09/14/2010   SpO2 100%   BMI 44.29 kg/m  Gen:   Awake, no distress   Resp:  Normal effort  MSK:   Moves extremities without difficulty  Other:  TTP left axillary rib cage  Medical Decision Making  Medically screening exam initiated at 9:31 AM.  Appropriate orders placed.  Olivia Schmidt was informed that the remainder of the evaluation will be completed by another provider, this initial triage assessment does not replace that evaluation, and the importance of remaining in the ED until their evaluation is complete.  Work up initiated   Olivia Schmidt, [place] 01/08/22 [phone]

## 2022-01-08 NOTE — ED Provider Notes (Signed)
[facility] DEPT Provider Note   CSN: [id number] Arrival date & time: 01/08/22  0820     History  Chief Complaint  Patient presents with   Flank Pain    Olivia Schmidt is a 61 y.o. female.  Left side hurting  1 week, but getting worse. All of a sudden Sharp pain. Constant. Takes pain meds which works briefly  Walking makes it worse.  No chest pain, shortness of breaht, palpitations. No N/V/D. No fevers. No urinary symptoms. No vaginal discharge. No trauma.   Flank Pain       Home Medications Prior to Admission medications   Medication Sig Start Date End Date Taking? Authorizing Provider  acetaminophen (TYLENOL 8 HOUR) 650 MG CR tablet Take 1 tablet (650 mg total) by mouth every 8 (eight) hours as needed. Patient taking differently: Take 650 mg by mouth every 8 (eight) hours as needed for pain. 10/01/17   Varney Biles, MD  albuterol (VENTOLIN HFA) 108 (90 Base) MCG/ACT inhaler Inhale 1 puff into the lungs every 6 (six) hours as needed for shortness of breath.    [provider]  cyclobenzaprine (FLEXERIL) 10 MG tablet Take 1 tablet by mouth 3 times a day as needed for muscle spasms 08/11/21     cyclobenzaprine (FLEXERIL) 10 MG tablet Take 1 tablet 3 times a day as needed for muscle spasms. 11/26/21     cyclobenzaprine (FLEXERIL) 10 MG tablet Take 1 tablet 3 times a day as needed for muscle spasms 12/26/21     ergocalciferol (VITAMIN D2) 1.25 MG (50000 UT) capsule Take 1 capsule (50,000 Units total) by mouth once a week. 06/13/21     ergocalciferol (VITAMIN D2) 1.25 MG (50000 UT) capsule Take 1 (one) capsule by mouth weekly 08/11/21     ergocalciferol (VITAMIN D2) 1.25 MG (50000 UT) capsule Take 1 capsule by mouth once a week 09/11/21     ergocalciferol (VITAMIN D2) 1.25 MG (50000 UT) capsule Take 1 capsule by mouth once weekly 11/09/21     lisinopril (ZESTRIL) 10 MG tablet TAKE 1 (ONE) TABLET BY MOUTH IN THE EVENING Patient taking  differently: Pt takes in the am 08/17/20 10/06/21  [name], NP  lisinopril (ZESTRIL) 10 MG tablet Take 1 tablet by mouth in evening 11/09/21     metFORMIN (GLUCOPHAGE) 500 MG tablet Take 1 tablet by mouth daily 11/09/21     metoprolol succinate (TOPROL-XL) 25 MG 24 hr tablet Take 1 tablet by mouth once a day for hypertension. 11/09/21     Naproxen Sodium 375 MG TB24 Take 1 tablet by mouth daily 11/09/21     Naproxen Sodium 375 MG TB24 Take 1 tablet by mouth daily 12/26/21     oxyCODONE-acetaminophen (PERCOCET) 10-325 MG tablet Take 1 tablet by mouth 5 times daily, as needed for pain 12/26/21     phentermine (ADIPEX-P) 37.5 MG tablet Take 1 tablet by mouth daily 08/11/21     phentermine (ADIPEX-P) 37.5 MG tablet Take 1 tablet by mouth daily. 11/26/21     phentermine (ADIPEX-P) 37.5 MG tablet Take 1 tablet by mouth daily 12/26/21     phentermine 15 MG capsule Take 1 capsule by mouth daily 11/09/21     pravastatin (PRAVACHOL) 80 MG tablet Take 1 (one) tablet by mouth daily in evening 07/28/21     Semaglutide, 1 MG/DOSE, (OZEMPIC, 1 MG/DOSE,) 4 MG/3ML SOPN Inject 1 mg into the skin once a week 11/09/21     topiramate (TOPAMAX) 50 MG tablet  Take 1 tablet (50 mg total) by mouth daily. Patient not taking: Reported on 09/19/2021 06/13/21     topiramate (TOPAMAX) 50 MG tablet Take 1 tablet by mouth daily. 08/11/21     Vitamin D, Ergocalciferol, (DRISDOL) 1.25 MG (50000 UNIT) CAPS capsule Take 1 capsule by mouth once weekly 12/26/21         Allergies    Patient has no known allergies.    Review of Systems   Review of Systems  Genitourinary:  Positive for flank pain.    Physical Exam Updated Vital Signs BP (!) 182/63 (BP Location: Right Arm)   Pulse (!) 56   Temp 98.1 F (36.7 C) (Oral)   Resp 12   Ht '5\' 4"'$  (1.626 m)   Wt 117 kg   LMP 09/14/2010   SpO2 98%   BMI 44.29 kg/m  Physical Exam  ED Results / Procedures / Treatments   Labs (all labs ordered are listed, but only abnormal results are  displayed) Labs Reviewed  URINALYSIS, ROUTINE W REFLEX MICROSCOPIC    EKG None  Radiology DG Ribs Unilateral W/Chest Left  Result Date: 01/08/2022 CLINICAL DATA:  Rib pain.  No known trauma. EXAM: LEFT RIBS AND CHEST - 3+ VIEW COMPARISON:  CT 05/07/2015 FINDINGS: No fracture or other bone lesions are seen involving the ribs. Previous right shoulder arthroplasty. There is no evidence of pneumothorax or pleural effusion. Both lungs are clear. Heart size and mediastinal contours are within normal limits. IMPRESSION: Negative. Electronically Signed   By: Kerby Moors M.D.   On: 01/08/2022 09:51    Procedures Procedures  {Document cardiac monitor, telemetry assessment procedure when appropriate:1}  Medications Ordered in ED Medications - No data to display  ED Course/ Medical Decision Making/ A&P                           Medical Decision Making Amount and/or Complexity of Data Reviewed Labs: ordered.   ***  {Document critical care time when appropriate:1} {Document review of labs and clinical decision tools ie heart score, Chads2Vasc2 etc:1}  {Document your independent review of radiology images, and any outside records:1} {Document your discussion with family members, caretakers, and with consultants:1} {Document social determinants of health affecting pt's care:1} {Document your decision making why or why not admission, treatments were needed:1} Final Clinical Impression(s) / ED Diagnoses Final diagnoses:  None    Rx / DC Orders ED Discharge Orders     None

## 2022-01-08 NOTE — ED Triage Notes (Signed)
Patient c/o left flank pain x 1 1/2 weeks and worsening. Patient denies any dysuria or frequency.

## 2022-01-08 NOTE — Discharge Instructions (Addendum)
You were seen in the emergency department today for left-sided back pain.  This may be muscular.  Please use lidocaine patches every day as needed as well as your muscle relaxer and pain medication.  Heat may be helpful as well as gentle massage.  Please return if you have any change in your symptoms such as shortness of breath, chest pain, or difficulty urinating

## 2022-01-26 ENCOUNTER — Other Ambulatory Visit (HOSPITAL_COMMUNITY): Payer: Self-pay

## 2022-01-26 MED ORDER — VITAMIN D (ERGOCALCIFEROL) 1.25 MG (50000 UNIT) PO CAPS
ORAL_CAPSULE | ORAL | 1 refills | Status: DC
  Filled 2022-01-26: qty 12, 84d supply, fill #0

## 2022-01-26 MED ORDER — NAPROXEN SODIUM ER 375 MG PO TB24
ORAL_TABLET | ORAL | 1 refills | Status: DC
  Filled 2022-01-26: qty 90, 90d supply, fill #0

## 2022-01-26 MED ORDER — CYCLOBENZAPRINE HCL 10 MG PO TABS
ORAL_TABLET | ORAL | 0 refills | Status: DC
  Filled 2022-01-26: qty 90, 30d supply, fill #0

## 2022-01-26 MED ORDER — OXYCODONE-ACETAMINOPHEN 10-325 MG PO TABS
ORAL_TABLET | ORAL | 0 refills | Status: DC
  Filled 2022-01-26: qty 150, 30d supply, fill #0
  Filled 2022-01-29 (×2): qty 150, fill #0
  Filled 2022-02-05: qty 150, 30d supply, fill #0

## 2022-01-26 MED ORDER — OZEMPIC (1 MG/DOSE) 4 MG/3ML ~~LOC~~ SOPN
PEN_INJECTOR | SUBCUTANEOUS | 1 refills | Status: DC
  Filled 2022-01-26: qty 9, 84d supply, fill #0
  Filled 2022-02-05: qty 3, 28d supply, fill #0
  Filled 2022-02-27: qty 9, 84d supply, fill #0
  Filled 2022-03-05: qty 3, 28d supply, fill #0

## 2022-01-26 MED ORDER — PHENTERMINE HCL 37.5 MG PO TABS
ORAL_TABLET | ORAL | 1 refills | Status: DC
  Filled 2022-01-26: qty 30, 30d supply, fill #0

## 2022-01-29 ENCOUNTER — Other Ambulatory Visit (HOSPITAL_COMMUNITY): Payer: Self-pay

## 2022-01-31 ENCOUNTER — Other Ambulatory Visit (HOSPITAL_COMMUNITY): Payer: Self-pay

## 2022-02-05 ENCOUNTER — Other Ambulatory Visit (HOSPITAL_COMMUNITY): Payer: Self-pay

## 2022-02-23 ENCOUNTER — Other Ambulatory Visit (HOSPITAL_COMMUNITY): Payer: Self-pay

## 2022-02-23 MED ORDER — NALOXONE HCL 4 MG/0.1ML NA LIQD
NASAL | 3 refills | Status: DC
  Filled 2022-02-23: qty 2, 30d supply, fill #0

## 2022-02-23 MED ORDER — OXYCODONE-ACETAMINOPHEN 10-325 MG PO TABS
1.0000 | ORAL_TABLET | Freq: Every day | ORAL | 0 refills | Status: DC
  Filled 2022-02-27 – 2022-03-02 (×3): qty 150, 30d supply, fill #0
  Filled 2022-03-05: qty 105, 21d supply, fill #0
  Filled 2022-03-05: qty 45, 9d supply, fill #0

## 2022-02-23 MED ORDER — CYCLOBENZAPRINE HCL 10 MG PO TABS
10.0000 mg | ORAL_TABLET | Freq: Three times a day (TID) | ORAL | 0 refills | Status: DC | PRN
  Filled 2022-02-23: qty 90, 30d supply, fill #0

## 2022-02-24 ENCOUNTER — Other Ambulatory Visit (HOSPITAL_COMMUNITY): Payer: Self-pay

## 2022-02-27 ENCOUNTER — Other Ambulatory Visit (HOSPITAL_COMMUNITY): Payer: Self-pay

## 2022-03-02 ENCOUNTER — Other Ambulatory Visit (HOSPITAL_COMMUNITY): Payer: Self-pay

## 2022-03-05 ENCOUNTER — Other Ambulatory Visit (HOSPITAL_COMMUNITY): Payer: Self-pay

## 2022-03-07 ENCOUNTER — Other Ambulatory Visit (HOSPITAL_COMMUNITY): Payer: Self-pay

## 2022-03-22 ENCOUNTER — Other Ambulatory Visit (HOSPITAL_COMMUNITY): Payer: Self-pay

## 2022-03-23 ENCOUNTER — Other Ambulatory Visit (HOSPITAL_COMMUNITY): Payer: Self-pay

## 2022-03-23 MED ORDER — OXYCODONE-ACETAMINOPHEN 10-325 MG PO TABS
1.0000 | ORAL_TABLET | Freq: Every day | ORAL | 0 refills | Status: DC
  Filled 2022-03-23 – 2022-04-02 (×3): qty 150, 30d supply, fill #0

## 2022-03-23 MED ORDER — ERGOCALCIFEROL 1.25 MG (50000 UT) PO CAPS
1.0000 | ORAL_CAPSULE | ORAL | 1 refills | Status: DC
  Filled 2022-03-23: qty 12, 84d supply, fill #0

## 2022-03-23 MED ORDER — LISINOPRIL 10 MG PO TABS
10.0000 mg | ORAL_TABLET | Freq: Every evening | ORAL | 1 refills | Status: AC
  Filled 2022-03-23: qty 90, 90d supply, fill #0

## 2022-03-23 MED ORDER — METOPROLOL SUCCINATE ER 25 MG PO TB24
25.0000 mg | ORAL_TABLET | Freq: Every day | ORAL | 1 refills | Status: DC
  Filled 2022-03-23: qty 90, 90d supply, fill #0

## 2022-03-23 MED ORDER — CYCLOBENZAPRINE HCL 10 MG PO TABS
10.0000 mg | ORAL_TABLET | Freq: Three times a day (TID) | ORAL | 0 refills | Status: DC
  Filled 2022-03-23: qty 90, 30d supply, fill #0

## 2022-03-23 MED ORDER — PRAVASTATIN SODIUM 80 MG PO TABS
80.0000 mg | ORAL_TABLET | Freq: Every evening | ORAL | 1 refills | Status: DC
  Filled 2022-03-23: qty 90, 90d supply, fill #0

## 2022-03-23 MED ORDER — OZEMPIC (1 MG/DOSE) 4 MG/3ML ~~LOC~~ SOPN
1.0000 mg | PEN_INJECTOR | SUBCUTANEOUS | 0 refills | Status: DC
  Filled 2022-03-23: qty 3, 28d supply, fill #0

## 2022-03-23 MED ORDER — PHENTERMINE HCL 37.5 MG PO TABS
37.5000 mg | ORAL_TABLET | Freq: Every day | ORAL | 0 refills | Status: DC
  Filled 2022-03-23: qty 30, 30d supply, fill #0

## 2022-03-26 ENCOUNTER — Other Ambulatory Visit (HOSPITAL_COMMUNITY): Payer: Self-pay

## 2022-03-28 ENCOUNTER — Other Ambulatory Visit (HOSPITAL_COMMUNITY): Payer: Self-pay

## 2022-03-30 ENCOUNTER — Other Ambulatory Visit (HOSPITAL_COMMUNITY): Payer: Self-pay

## 2022-04-02 ENCOUNTER — Other Ambulatory Visit (HOSPITAL_COMMUNITY): Payer: Self-pay

## 2022-04-18 ENCOUNTER — Other Ambulatory Visit (HOSPITAL_COMMUNITY): Payer: Self-pay

## 2022-04-18 MED ORDER — OXYCODONE-ACETAMINOPHEN 10-325 MG PO TABS
1.0000 | ORAL_TABLET | Freq: Every day | ORAL | 0 refills | Status: DC | PRN
  Filled 2022-04-30: qty 150, 30d supply, fill #0

## 2022-04-18 MED ORDER — PHENTERMINE HCL 37.5 MG PO TABS
37.5000 mg | ORAL_TABLET | Freq: Every day | ORAL | 0 refills | Status: DC
  Filled 2022-06-26: qty 30, 30d supply, fill #0

## 2022-04-18 MED ORDER — ERGOCALCIFEROL 1.25 MG (50000 UT) PO CAPS
1.0000 | ORAL_CAPSULE | ORAL | 1 refills | Status: DC
  Filled 2022-04-18: qty 12, 84d supply, fill #0

## 2022-04-18 MED ORDER — CYCLOBENZAPRINE HCL 10 MG PO TABS
10.0000 mg | ORAL_TABLET | Freq: Three times a day (TID) | ORAL | 0 refills | Status: DC | PRN
  Filled 2022-04-18: qty 90, 30d supply, fill #0

## 2022-04-18 MED ORDER — NITROFURANTOIN MACROCRYSTAL 100 MG PO CAPS
100.0000 mg | ORAL_CAPSULE | Freq: Two times a day (BID) | ORAL | 0 refills | Status: DC
  Filled 2022-04-18: qty 14, 7d supply, fill #0

## 2022-04-18 MED ORDER — OZEMPIC (1 MG/DOSE) 4 MG/3ML ~~LOC~~ SOPN
1.0000 mg | PEN_INJECTOR | SUBCUTANEOUS | 1 refills | Status: DC
  Filled 2022-04-18: qty 3, 28d supply, fill #0
  Filled 2022-06-26: qty 3, 28d supply, fill #1

## 2022-04-19 ENCOUNTER — Other Ambulatory Visit (HOSPITAL_COMMUNITY): Payer: Self-pay

## 2022-04-25 ENCOUNTER — Other Ambulatory Visit (HOSPITAL_COMMUNITY): Payer: Self-pay

## 2022-04-30 ENCOUNTER — Other Ambulatory Visit (HOSPITAL_COMMUNITY): Payer: Self-pay

## 2022-05-16 ENCOUNTER — Other Ambulatory Visit (HOSPITAL_COMMUNITY): Payer: Self-pay

## 2022-05-16 ENCOUNTER — Emergency Department (HOSPITAL_BASED_OUTPATIENT_CLINIC_OR_DEPARTMENT_OTHER)
Admit: 2022-05-16 | Discharge: 2022-05-16 | Disposition: A | Discharge status: Home / Self Care | Payer: BC Managed Care – PPO

## 2022-05-16 ENCOUNTER — Emergency Department (HOSPITAL_COMMUNITY)
Admission: EM | Admit: 2022-05-16 | Discharge: 2022-05-16 | Disposition: A | Discharge status: Home / Self Care | Payer: BC Managed Care – PPO | Attending: Emergency Medicine | Admitting: Emergency Medicine

## 2022-05-16 ENCOUNTER — Other Ambulatory Visit: Payer: Self-pay

## 2022-05-16 DIAGNOSIS — R600 Localized edema: Secondary | ICD-10-CM | POA: Diagnosis not present

## 2022-05-16 DIAGNOSIS — I10 Essential (primary) hypertension: Secondary | ICD-10-CM | POA: Diagnosis not present

## 2022-05-16 DIAGNOSIS — Z79899 Other long term (current) drug therapy: Secondary | ICD-10-CM | POA: Diagnosis not present

## 2022-05-16 DIAGNOSIS — M79604 Pain in right leg: Secondary | ICD-10-CM | POA: Diagnosis present

## 2022-05-16 DIAGNOSIS — R52 Pain, unspecified: Secondary | ICD-10-CM | POA: Diagnosis not present

## 2022-05-16 MED ORDER — ERGOCALCIFEROL 1.25 MG (50000 UT) PO CAPS
50000.0000 [IU] | ORAL_CAPSULE | ORAL | 1 refills | Status: DC
  Filled 2022-05-16: qty 12, 84d supply, fill #0

## 2022-05-16 MED ORDER — PHENTERMINE HCL 37.5 MG PO TABS
37.5000 mg | ORAL_TABLET | Freq: Every day | ORAL | 0 refills | Status: DC
  Filled 2022-05-16: qty 30, 30d supply, fill #0

## 2022-05-16 MED ORDER — CYCLOBENZAPRINE HCL 10 MG PO TABS
10.0000 mg | ORAL_TABLET | Freq: Three times a day (TID) | ORAL | 0 refills | Status: DC | PRN
  Filled 2022-05-17: qty 90, 30d supply, fill #0

## 2022-05-16 MED ORDER — OXYCODONE-ACETAMINOPHEN 10-325 MG PO TABS
1.0000 | ORAL_TABLET | Freq: Every day | ORAL | 0 refills | Status: DC | PRN
  Filled 2022-05-28: qty 150, 30d supply, fill #0

## 2022-05-16 NOTE — ED Notes (Signed)
Pt verbalized understanding of discharge instructions. Opportunity for questions provided.  

## 2022-05-16 NOTE — ED Triage Notes (Signed)
Pt reports right calf pain x 2 days. Pt was sent from PCP.

## 2022-05-16 NOTE — ED Provider Notes (Signed)
[facility] DEPT Provider Note   CSN: [id number] Arrival date & time: 05/16/22  1154     History  Chief Complaint  Patient presents with   Leg Pain    Olivia Schmidt is a 61 y.o. female.  61 year old female with prior medical history of detail below presents for evaluation.  Patient complains of right lower extremity pain for approximately 3 to 4 days.  Patient notes pain is mostly localized in the right calf muscle.  Patient reports the pain is worse with walking or standing lengthy period times.  She denies chest pain or shortness of breath.  She denies prior history of DVT or PE.  Patient denies any recent long plane flights or travel.  Patient does report that she stands on her feet for 12 hours a day at her job.  Patient was seen earlier today by her PCP for same complaint.  She was sent to the ED for ultrasound to rule out DVT.  The history is provided by the patient and medical records.       Home Medications Prior to Admission medications   Medication Sig Start Date End Date Taking? Authorizing Provider  acetaminophen (TYLENOL 8 HOUR) 650 MG CR tablet Take 1 tablet (650 mg total) by mouth every 8 (eight) hours as needed. Patient taking differently: Take 650 mg by mouth every 8 (eight) hours as needed for pain. 10/01/17   Varney Biles, MD  albuterol (VENTOLIN HFA) 108 (90 Base) MCG/ACT inhaler Inhale 1 puff into the lungs every 6 (six) hours as needed for shortness of breath.    [provider]  cyclobenzaprine (FLEXERIL) 10 MG tablet Take 1 tablet by mouth 3 times a day as needed for muscle spasms 08/11/21     cyclobenzaprine (FLEXERIL) 10 MG tablet Take 1 tablet 3 times a day as needed for muscle spasms. 11/26/21     cyclobenzaprine (FLEXERIL) 10 MG tablet Take 1 tablet 3 times a day as needed for muscle spasms 12/26/21     cyclobenzaprine (FLEXERIL) 10 MG tablet Take 1 Tablet 3 times a day as needed for muscle spasms 01/26/22      cyclobenzaprine (FLEXERIL) 10 MG tablet Take 1 tablet (10 mg total) by mouth 3 (three) times daily as needed for muscle spasms. 02/23/22     cyclobenzaprine (FLEXERIL) 10 MG tablet Take 1 tablet (10 mg total) by mouth 3 (three) times daily. 03/22/22     cyclobenzaprine (FLEXERIL) 10 MG tablet Take 1 tablet (10 mg total) by mouth 3 (three) times daily as needed for muscle spasms 04/18/22     cyclobenzaprine (FLEXERIL) 10 MG tablet Take 1 tablet (10 mg total) by mouth 3 (three) times daily as needed for muscle spasms. 05/16/22     ergocalciferol (VITAMIN D2) 1.25 MG (50000 UT) capsule Take 1 capsule (50,000 Units total) by mouth once a week. 06/13/21     ergocalciferol (VITAMIN D2) 1.25 MG (50000 UT) capsule Take 1 (one) capsule by mouth weekly 08/11/21     ergocalciferol (VITAMIN D2) 1.25 MG (50000 UT) capsule Take 1 capsule by mouth once a week 09/11/21     ergocalciferol (VITAMIN D2) 1.25 MG (50000 UT) capsule Take 1 capsule by mouth once weekly 11/09/21     ergocalciferol (VITAMIN D2) 1.25 MG (50000 UT) capsule Take 1 capsule (50,000 Units total) by mouth once a week. 03/22/22   [name], NP  ergocalciferol (VITAMIN D2) 1.25 MG (50000 UT) capsule Take 1 capsule (50,000 Units total) by  mouth once a week. 04/18/22   [name], NP  ergocalciferol (VITAMIN D2) 1.25 MG (50000 UT) capsule Take 1 capsule (50,000 Units total) by mouth once a week. 05/16/22     lidocaine (LIDODERM) 5 % Place 1 patch onto the skin daily. Remove & Discard patch within 12 hours or as directed by MD 01/08/22   Mickie Hillier, PA-C  lisinopril (ZESTRIL) 10 MG tablet TAKE 1 (ONE) TABLET BY MOUTH IN THE EVENING Patient taking differently: Pt takes in the am 08/17/20 10/06/21  [name], NP  lisinopril (ZESTRIL) 10 MG tablet Take 1 tablet by mouth in evening 11/09/21     lisinopril (ZESTRIL) 10 MG tablet Take 1 tablet (10 mg total) by mouth every evening. 03/22/22     metFORMIN (GLUCOPHAGE) 500 MG tablet Take 1 tablet by mouth daily  11/09/21     metoprolol succinate (TOPROL-XL) 25 MG 24 hr tablet Take 1 tablet by mouth once a day for hypertension. 11/09/21     metoprolol succinate (TOPROL-XL) 25 MG 24 hr tablet Take 1 tablet (25 mg total) by mouth daily. 03/22/22     naloxone (NARCAN) nasal spray 4 mg/0.1 mL Use 1 spray in one nostril as needed as directed 02/23/22     Naproxen Sodium 375 MG TB24 Take 1 tablet by mouth daily 11/09/21     Naproxen Sodium 375 MG TB24 Take 1 tablet by mouth daily 12/26/21     Naproxen Sodium 375 MG TB24 Take 1 tablet by mouth daily 01/26/22     nitrofurantoin (MACRODANTIN) 100 MG capsule Take 1 capsule (100 mg total) by mouth 2 (two) times daily. 04/18/22     oxyCODONE-acetaminophen (PERCOCET) 10-325 MG tablet Take 1 tablet by mouth 5 (five) times daily as needed for pain. 03/22/22     oxyCODONE-acetaminophen (PERCOCET) 10-325 MG tablet Take 1 tablet by mouth 5 (five) times daily as needed for pain. 05/16/22     phentermine (ADIPEX-P) 37.5 MG tablet Take 1 tablet by mouth daily 08/11/21     phentermine (ADIPEX-P) 37.5 MG tablet Take 1 tablet by mouth daily. 11/26/21     phentermine (ADIPEX-P) 37.5 MG tablet Take 1 tablet by mouth daily 12/26/21     phentermine (ADIPEX-P) 37.5 MG tablet Take 1 tablet by mouth daily 01/26/22     phentermine (ADIPEX-P) 37.5 MG tablet Take 1 tablet (37.5 mg total) by mouth daily. 03/22/22     phentermine (ADIPEX-P) 37.5 MG tablet Take 1 tablet (37.5 mg total) by mouth daily. 04/18/22     phentermine (ADIPEX-P) 37.5 MG tablet Take 1 tablet (37.5 mg total) by mouth daily. 05/16/22     phentermine 15 MG capsule Take 1 capsule by mouth daily 11/09/21     pravastatin (PRAVACHOL) 80 MG tablet Take 1 (one) tablet by mouth daily in evening 07/28/21     pravastatin (PRAVACHOL) 80 MG tablet Take 1 tablet (80 mg total) by mouth every evening. 03/22/22     Semaglutide, 1 MG/DOSE, (OZEMPIC, 1 MG/DOSE,) 4 MG/3ML SOPN Inject 1 mg under skin once weekly 01/26/22     Semaglutide, 1 MG/DOSE, (OZEMPIC, 1  MG/DOSE,) 4 MG/3ML SOPN Inject 1 mg into the skin once a week. 03/22/22     Semaglutide, 1 MG/DOSE, (OZEMPIC, 1 MG/DOSE,) 4 MG/3ML SOPN Inject 1 mg into the skin once a week. 04/18/22     topiramate (TOPAMAX) 50 MG tablet Take 1 tablet (50 mg total) by mouth daily. Patient not taking: Reported on 09/19/2021 06/13/21  topiramate (TOPAMAX) 50 MG tablet Take 1 tablet by mouth daily. 08/11/21     Vitamin D, Ergocalciferol, (DRISDOL) 1.25 MG (50000 UNIT) CAPS capsule Take 1 capsule by mouth once weekly 12/26/21     Vitamin D, Ergocalciferol, (DRISDOL) 1.25 MG (50000 UNIT) CAPS capsule Take 1 capsule by mouth weekly. 01/26/22   [name], NP      Allergies    Patient has no known allergies.    Review of Systems   Review of Systems  All other systems reviewed and are negative.   Physical Exam Updated Vital Signs BP (!) 165/74   Pulse 84   Temp 97.9 F (36.6 C) (Oral)   Resp 18   LMP 09/14/2010   SpO2 100%  Physical Exam Vitals and nursing note reviewed.  Constitutional:      General: She is not in acute distress.    Appearance: Normal appearance. She is well-developed.  HENT:     Head: Normocephalic and atraumatic.  Eyes:     Conjunctiva/sclera: Conjunctivae normal.     Pupils: Pupils are equal, round, and reactive to light.  Cardiovascular:     Rate and Rhythm: Normal rate and regular rhythm.     Heart sounds: Normal heart sounds.  Pulmonary:     Effort: Pulmonary effort is normal. No respiratory distress.     Breath sounds: Normal breath sounds.  Abdominal:     General: There is no distension.     Palpations: Abdomen is soft.     Tenderness: There is no abdominal tenderness.  Musculoskeletal:        General: No deformity. Normal range of motion.     Cervical back: Normal range of motion and neck supple.     Comments: Tenderness with palpation to the right calf  - minimal edema noted to the right foot.  Right lower extremity is neurovascular intact with 2+ dorsalis pedis  pulse.  Sensation is intact.  Patient is ambulatory without difficulty.  Skin:    General: Skin is warm and dry.  Neurological:     General: No focal deficit present.     Mental Status: She is alert and oriented to person, place, and time.     ED Results / Procedures / Treatments   Labs (all labs ordered are listed, but only abnormal results are displayed) Labs Reviewed - No data to display  EKG None  Radiology VAS [place] LOWER EXTREMITY VENOUS (DVT) (7a-7p)  Result Date: 05/16/2022  Lower Venous DVT Study Patient Name:  Olivia Schmidt  Date of Exam:   05/16/2022 Medical Rec #: [id number]             Accession #:    [id number] Date of Birth: [birth year]            Patient Gender: F Patient Age:   69 years Exam Location:  [facility] Procedure:      VAS [place] LOWER EXTREMITY VENOUS (DVT) Referring Phys: Theodis Blaze --------------------------------------------------------------------------------  Indications: Pain.  Risk Factors: None identified. Limitations: Body habitus, poor ultrasound/tissue interface and patient positioning. Comparison Study: No prior studies. Performing Technologist: Oliver Hum RVT  Examination Guidelines: A complete evaluation includes B-mode imaging, spectral Doppler, color Doppler, and power Doppler as needed of all accessible portions of each vessel. Bilateral testing is considered an integral part of a complete examination. Limited examinations for reoccurring indications may be performed as noted. The reflux portion of the exam is performed with the patient in reverse Trendelenburg.  +---------+---------------+---------+-----------+----------+--------------+ RIGHT  CompressibilityPhasicitySpontaneityPropertiesThrombus Aging +---------+---------------+---------+-----------+----------+--------------+ CFV      Full           Yes      Yes                                 +---------+---------------+---------+-----------+----------+--------------+  SFJ      Full                                                        +---------+---------------+---------+-----------+----------+--------------+ FV Prox  Full                                                        +---------+---------------+---------+-----------+----------+--------------+ FV Mid                  Yes      Yes                                 +---------+---------------+---------+-----------+----------+--------------+ FV Distal               Yes      Yes                                 +---------+---------------+---------+-----------+----------+--------------+ PFV      Full                                                        +---------+---------------+---------+-----------+----------+--------------+ POP      Full           Yes      Yes                                 +---------+---------------+---------+-----------+----------+--------------+ PTV      Full                                                        +---------+---------------+---------+-----------+----------+--------------+ PERO     Full                                                        +---------+---------------+---------+-----------+----------+--------------+   +----+---------------+---------+-----------+----------+--------------+ LEFTCompressibilityPhasicitySpontaneityPropertiesThrombus Aging +----+---------------+---------+-----------+----------+--------------+ CFV Full           Yes      Yes                                 +----+---------------+---------+-----------+----------+--------------+  Summary: RIGHT: - There is no evidence of deep vein thrombosis in the lower extremity. However, portions of this examination were limited- see technologist comments above.  - No cystic structure found in the popliteal fossa.  LEFT: - No evidence of common femoral vein obstruction.  *See table(s) above for measurements and observations.    Preliminary      Procedures Procedures    Medications Ordered in ED Medications - No data to display  ED Course/ Medical Decision Making/ A&P                           Medical Decision Making   Medical Screen Complete  This patient presented to the ED with complaint of right leg pain.  This complaint involves an extensive number of treatment options. The initial differential diagnosis includes, but is not limited to, DVT versus muscle strain  This presentation is: Acute, Self-Limited, Previously Undiagnosed, Uncertain Prognosis, Complicated, Systemic Symptoms, and Threat to Life/Bodily Function  Patient is presenting with complaint of right lower extremity pain in the right calf.  Exam is most consistent with likely muscular strain.  Ultrasound obtained is without evidence of DVT.    Patient is reassured by findings.  She does understand need for close outpatient follow-up.    Patient understands that if symptoms fail to improve or certainly worsen within the next 2 weeks she should obtain a repeat ultrasound.  Strict return precautions given and understood. Additional history obtained:  External records from outside sources obtained and reviewed including prior ED visits and prior Inpatient records.   Problem List / ED Course:  RLE pain / muscle strain   Reevaluation:  After the interventions noted above, I reevaluated the patient and found that they have: improved   Disposition:  After consideration of the diagnostic results and the patients response to treatment, I feel that the patent would benefit from close outpatient follow up.          Final Clinical Impression(s) / ED Diagnoses Final diagnoses:  Right leg pain    Rx / DC Orders ED Discharge Orders     None         Valarie Merino, MD 05/16/22 1404

## 2022-05-16 NOTE — Discharge Instructions (Addendum)
Return for any problem.  Your ultrasound today showed no blood clot in your leg.  If your pain fails to resolve or if you develop worsening swelling to your Right Leg - you may require repeat ultrasound study within 2 weeks. Follow up closely as instructed with your PCP.

## 2022-05-16 NOTE — ED Provider Triage Note (Signed)
Emergency Medicine Provider Triage Evaluation Note  Olivia Schmidt , a 61 y.o. female  was evaluated in triage.  Pt complains of right lower extremity pain for 3 days. States pain is worse when she is walking and after work. She has noticed minor swelling in the leg. Denies numbness or tingling. Denies recent long drives or flights or surgery or malignancy. Denies history of blood clots. Denies shortness of breath.   Review of Systems  Positive: See above Negative:   Physical Exam  BP (!) 165/74   Pulse 84   Temp 97.9 F (36.6 C) (Oral)   Resp 18   LMP 09/14/2010   SpO2 100%  Gen:   Awake, no distress   Resp:  Normal effort  MSK:   Moves extremities without difficulty  Other:  Right calf is swollen and tender compared to the left. DP pulse 2+. Sensation intact.   Medical Decision Making  Medically screening exam initiated at 1:12 PM.  Appropriate orders placed.  Olivia Schmidt was informed that the remainder of the evaluation will be completed by another provider, this initial triage assessment does not replace that evaluation, and the importance of remaining in the ED until their evaluation is complete.     Olivia Hillier, PA-C 05/16/22 1313

## 2022-05-16 NOTE — ED Notes (Signed)
ED Provider at bedside. 

## 2022-05-16 NOTE — Progress Notes (Signed)
Right lower extremity venous duplex has been completed. Preliminary results can be found in CV Proc through chart review.  Results were given to Theodis Blaze PA.  05/16/22 1:40 PM Olivia Schmidt RVT

## 2022-05-17 ENCOUNTER — Other Ambulatory Visit (HOSPITAL_COMMUNITY): Payer: Self-pay

## 2022-05-21 ENCOUNTER — Other Ambulatory Visit (HOSPITAL_COMMUNITY): Payer: Self-pay

## 2022-05-28 ENCOUNTER — Other Ambulatory Visit (HOSPITAL_COMMUNITY): Payer: Self-pay

## 2022-06-14 ENCOUNTER — Other Ambulatory Visit (HOSPITAL_COMMUNITY): Payer: Self-pay

## 2022-06-15 ENCOUNTER — Other Ambulatory Visit (HOSPITAL_COMMUNITY): Payer: Self-pay

## 2022-06-15 MED ORDER — CYCLOBENZAPRINE HCL 10 MG PO TABS
10.0000 mg | ORAL_TABLET | Freq: Three times a day (TID) | ORAL | 0 refills | Status: DC | PRN
  Filled 2022-06-15 – 2022-06-26 (×2): qty 90, 30d supply, fill #0

## 2022-06-15 MED ORDER — NAPROXEN SODIUM ER 375 MG PO TB24
375.0000 mg | ORAL_TABLET | Freq: Every day | ORAL | 1 refills | Status: DC
  Filled 2022-06-15: qty 90, 90d supply, fill #0

## 2022-06-15 MED ORDER — OXYCODONE-ACETAMINOPHEN 10-325 MG PO TABS
1.0000 | ORAL_TABLET | Freq: Every day | ORAL | 0 refills | Status: DC | PRN
  Filled 2022-06-26: qty 150, 30d supply, fill #0

## 2022-06-15 MED ORDER — VITAMIN D (ERGOCALCIFEROL) 1.25 MG (50000 UNIT) PO CAPS
50000.0000 [IU] | ORAL_CAPSULE | ORAL | 1 refills | Status: DC
  Filled 2022-06-15: qty 12, 84d supply, fill #0

## 2022-06-15 MED ORDER — OZEMPIC (1 MG/DOSE) 4 MG/3ML ~~LOC~~ SOPN
1.0000 mg | PEN_INJECTOR | SUBCUTANEOUS | 1 refills | Status: AC
  Filled 2022-06-15 – 2022-10-16 (×2): qty 3, 28d supply, fill #0
  Filled 2022-12-15 – 2023-01-30 (×2): qty 3, 28d supply, fill #1
  Filled 2023-05-24: qty 3, 28d supply, fill #2

## 2022-06-15 MED ORDER — PHENTERMINE HCL 37.5 MG PO TABS: 37.5000 mg | ORAL_TABLET | Freq: Every day | ORAL | 1 refills | Status: DC

## 2022-06-18 ENCOUNTER — Other Ambulatory Visit (HOSPITAL_COMMUNITY): Payer: Self-pay

## 2022-06-25 ENCOUNTER — Other Ambulatory Visit (HOSPITAL_COMMUNITY): Payer: Self-pay

## 2022-06-26 ENCOUNTER — Other Ambulatory Visit (HOSPITAL_COMMUNITY): Payer: Self-pay

## 2022-07-13 ENCOUNTER — Other Ambulatory Visit (HOSPITAL_COMMUNITY): Payer: Self-pay

## 2022-07-13 MED ORDER — CYCLOBENZAPRINE HCL 10 MG PO TABS
10.0000 mg | ORAL_TABLET | Freq: Three times a day (TID) | ORAL | 0 refills | Status: DC | PRN
  Filled 2022-07-13 – 2022-07-25 (×2): qty 90, 30d supply, fill #0

## 2022-07-13 MED ORDER — OXYCODONE-ACETAMINOPHEN 10-325 MG PO TABS
1.0000 | ORAL_TABLET | Freq: Every day | ORAL | 0 refills | Status: DC | PRN
  Filled 2022-07-13 – 2022-07-25 (×2): qty 150, 30d supply, fill #0

## 2022-07-13 MED ORDER — NALOXONE HCL 4 MG/0.1ML NA LIQD
NASAL | 3 refills | Status: DC
  Filled 2022-07-13: qty 2, 30d supply, fill #0

## 2022-07-16 ENCOUNTER — Other Ambulatory Visit (HOSPITAL_COMMUNITY): Payer: Self-pay

## 2022-07-17 ENCOUNTER — Other Ambulatory Visit (HOSPITAL_COMMUNITY): Payer: Self-pay

## 2022-07-24 ENCOUNTER — Other Ambulatory Visit (HOSPITAL_COMMUNITY): Payer: Self-pay

## 2022-07-25 ENCOUNTER — Other Ambulatory Visit (HOSPITAL_COMMUNITY): Payer: Self-pay

## 2022-08-09 ENCOUNTER — Other Ambulatory Visit (HOSPITAL_COMMUNITY): Payer: Self-pay

## 2022-08-09 MED ORDER — CYCLOBENZAPRINE HCL 10 MG PO TABS
10.0000 mg | ORAL_TABLET | Freq: Three times a day (TID) | ORAL | 0 refills | Status: DC | PRN
  Filled 2022-08-09: qty 90, 30d supply, fill #0

## 2022-08-09 MED ORDER — OXYCODONE-ACETAMINOPHEN 10-325 MG PO TABS
1.0000 | ORAL_TABLET | ORAL | 0 refills | Status: DC
  Filled 2022-08-25: qty 150, 30d supply, fill #0

## 2022-08-09 MED ORDER — PHENTERMINE HCL 37.5 MG PO TABS
37.5000 mg | ORAL_TABLET | Freq: Every day | ORAL | 0 refills | Status: DC
  Filled 2022-08-09: qty 30, 30d supply, fill #0

## 2022-08-09 MED ORDER — ERGOCALCIFEROL 1.25 MG (50000 UT) PO CAPS
1.0000 | ORAL_CAPSULE | ORAL | 1 refills | Status: DC
  Filled 2022-08-09: qty 12, 84d supply, fill #0

## 2022-08-09 MED ORDER — OZEMPIC (1 MG/DOSE) 4 MG/3ML ~~LOC~~ SOPN
1.0000 mg | PEN_INJECTOR | SUBCUTANEOUS | 1 refills | Status: DC
  Filled 2022-08-09: qty 3, 28d supply, fill #0
  Filled 2022-09-17: qty 3, 28d supply, fill #1

## 2022-08-13 ENCOUNTER — Other Ambulatory Visit (HOSPITAL_COMMUNITY): Payer: Self-pay

## 2022-08-14 ENCOUNTER — Other Ambulatory Visit (HOSPITAL_COMMUNITY): Payer: Self-pay

## 2022-08-20 ENCOUNTER — Other Ambulatory Visit (HOSPITAL_COMMUNITY): Payer: Self-pay

## 2022-08-22 ENCOUNTER — Other Ambulatory Visit (HOSPITAL_COMMUNITY): Payer: Self-pay

## 2022-08-25 ENCOUNTER — Other Ambulatory Visit (HOSPITAL_COMMUNITY): Payer: Self-pay

## 2022-09-06 ENCOUNTER — Other Ambulatory Visit (HOSPITAL_COMMUNITY): Payer: Self-pay

## 2022-09-06 MED ORDER — CYCLOBENZAPRINE HCL 10 MG PO TABS
10.0000 mg | ORAL_TABLET | Freq: Three times a day (TID) | ORAL | 0 refills | Status: DC | PRN
  Filled 2022-09-06: qty 90, 30d supply, fill #0

## 2022-09-06 MED ORDER — OXYCODONE-ACETAMINOPHEN 10-325 MG PO TABS
1.0000 | ORAL_TABLET | Freq: Every day | ORAL | 0 refills | Status: DC
  Filled 2022-09-22: qty 150, 30d supply, fill #0

## 2022-09-06 MED ORDER — PHENTERMINE HCL 37.5 MG PO TABS
37.5000 mg | ORAL_TABLET | Freq: Every day | ORAL | 0 refills | Status: DC
  Filled 2022-09-06 – 2022-12-15 (×2): qty 30, 30d supply, fill #0

## 2022-09-06 MED ORDER — ERGOCALCIFEROL 1.25 MG (50000 UT) PO CAPS
1.0000 | ORAL_CAPSULE | ORAL | 1 refills | Status: DC
  Filled 2022-09-06: qty 12, 84d supply, fill #0

## 2022-09-07 ENCOUNTER — Other Ambulatory Visit (HOSPITAL_COMMUNITY): Payer: Self-pay

## 2022-09-13 ENCOUNTER — Other Ambulatory Visit (HOSPITAL_COMMUNITY): Payer: Self-pay

## 2022-09-14 ENCOUNTER — Other Ambulatory Visit (HOSPITAL_COMMUNITY): Payer: Self-pay

## 2022-09-17 ENCOUNTER — Other Ambulatory Visit (HOSPITAL_COMMUNITY): Payer: Self-pay

## 2022-09-19 ENCOUNTER — Other Ambulatory Visit (HOSPITAL_COMMUNITY): Payer: Self-pay

## 2022-09-22 ENCOUNTER — Other Ambulatory Visit (HOSPITAL_COMMUNITY): Payer: Self-pay

## 2022-09-24 ENCOUNTER — Other Ambulatory Visit (HOSPITAL_COMMUNITY): Payer: Self-pay

## 2022-09-24 MED ORDER — VITAMIN D (ERGOCALCIFEROL) 1.25 MG (50000 UNIT) PO CAPS
50000.0000 [IU] | ORAL_CAPSULE | ORAL | 1 refills | Status: DC
  Filled 2022-09-24: qty 12, 84d supply, fill #0

## 2022-10-02 ENCOUNTER — Other Ambulatory Visit (HOSPITAL_COMMUNITY): Payer: Self-pay

## 2022-10-08 ENCOUNTER — Other Ambulatory Visit (HOSPITAL_COMMUNITY): Payer: Self-pay

## 2022-10-08 MED ORDER — CYCLOBENZAPRINE HCL 10 MG PO TABS
10.0000 mg | ORAL_TABLET | Freq: Three times a day (TID) | ORAL | 0 refills | Status: DC | PRN
  Filled 2022-10-08 – 2022-10-16 (×2): qty 90, 30d supply, fill #0

## 2022-10-08 MED ORDER — OXYCODONE-ACETAMINOPHEN 10-325 MG PO TABS
1.0000 | ORAL_TABLET | Freq: Every day | ORAL | 0 refills | Status: DC | PRN
  Filled 2022-10-20 (×2): qty 150, 32d supply, fill #0

## 2022-10-08 MED ORDER — ERGOCALCIFEROL 1.25 MG (50000 UT) PO CAPS
50000.0000 [IU] | ORAL_CAPSULE | ORAL | 1 refills | Status: AC
  Filled 2022-10-08 – 2022-10-16 (×2): qty 12, 84d supply, fill #0

## 2022-10-08 MED ORDER — PHENTERMINE HCL 37.5 MG PO TABS
37.5000 mg | ORAL_TABLET | Freq: Every day | ORAL | 0 refills | Status: DC
  Filled 2022-10-08 – 2022-10-16 (×2): qty 30, 30d supply, fill #0

## 2022-10-16 ENCOUNTER — Other Ambulatory Visit (HOSPITAL_COMMUNITY): Payer: Self-pay

## 2022-10-19 ENCOUNTER — Other Ambulatory Visit: Payer: Self-pay

## 2022-10-20 ENCOUNTER — Other Ambulatory Visit (HOSPITAL_COMMUNITY): Payer: Self-pay

## 2022-10-30 ENCOUNTER — Ambulatory Visit: Payer: Self-pay | Admitting: Surgery

## 2022-10-30 ENCOUNTER — Other Ambulatory Visit (HOSPITAL_COMMUNITY): Payer: Self-pay | Admitting: Surgery

## 2022-10-30 DIAGNOSIS — Z01818 Encounter for other preprocedural examination: Secondary | ICD-10-CM

## 2022-11-06 ENCOUNTER — Other Ambulatory Visit (HOSPITAL_COMMUNITY): Payer: Self-pay

## 2022-11-06 MED ORDER — OXYCODONE-ACETAMINOPHEN 10-325 MG PO TABS
1.0000 | ORAL_TABLET | Freq: Every day | ORAL | 0 refills | Status: DC | PRN
  Filled 2022-11-17: qty 150, 30d supply, fill #0

## 2022-11-12 ENCOUNTER — Other Ambulatory Visit (HOSPITAL_COMMUNITY): Payer: Self-pay

## 2022-11-17 ENCOUNTER — Other Ambulatory Visit (HOSPITAL_COMMUNITY): Payer: Self-pay

## 2022-11-22 ENCOUNTER — Encounter (HOSPITAL_COMMUNITY)
Admission: RE | Admit: 2022-11-22 | Discharge: 2022-11-22 | Disposition: A | Discharge status: Home / Self Care | Payer: BC Managed Care – PPO | Source: Ambulatory Visit | Attending: Surgery | Admitting: Surgery

## 2022-11-22 ENCOUNTER — Other Ambulatory Visit: Payer: Self-pay

## 2022-11-22 ENCOUNTER — Ambulatory Visit (HOSPITAL_COMMUNITY)
Admission: RE | Admit: 2022-11-22 | Discharge: 2022-11-22 | Disposition: A | Discharge status: Home / Self Care | Payer: BC Managed Care – PPO | Source: Ambulatory Visit | Attending: Surgery | Admitting: Surgery

## 2022-11-22 DIAGNOSIS — Z01818 Encounter for other preprocedural examination: Secondary | ICD-10-CM | POA: Diagnosis present

## 2022-12-03 ENCOUNTER — Other Ambulatory Visit (HOSPITAL_COMMUNITY): Payer: Self-pay

## 2022-12-03 ENCOUNTER — Encounter: Payer: Self-pay | Admitting: Dietician

## 2022-12-03 ENCOUNTER — Encounter: Payer: BC Managed Care – PPO | Attending: Surgery | Admitting: Dietician

## 2022-12-03 VITALS — Ht 64.0 in | Wt 276.3 lb

## 2022-12-03 DIAGNOSIS — E669 Obesity, unspecified: Secondary | ICD-10-CM | POA: Diagnosis present

## 2022-12-03 MED ORDER — CYCLOBENZAPRINE HCL 10 MG PO TABS
10.0000 mg | ORAL_TABLET | Freq: Three times a day (TID) | ORAL | 0 refills | Status: AC | PRN
  Filled 2022-12-03 – 2023-07-05 (×4): qty 90, 30d supply, fill #0

## 2022-12-03 MED ORDER — OXYCODONE-ACETAMINOPHEN 10-325 MG PO TABS
1.0000 | ORAL_TABLET | Freq: Every day | ORAL | 0 refills | Status: DC | PRN
  Filled 2022-12-03: qty 150, 38d supply, fill #0
  Filled 2022-12-04: qty 150, 32d supply, fill #0
  Filled 2022-12-15: qty 150, 30d supply, fill #0

## 2022-12-03 MED ORDER — NALOXONE HCL 4 MG/0.1ML NA LIQD
NASAL | 3 refills | Status: AC
  Filled 2022-12-03: qty 2, 30d supply, fill #0

## 2022-12-03 NOTE — Progress Notes (Signed)
Nutrition Assessment for Bariatric Surgery: Pre-Surgery Behavioral and Nutrition Intervention Program   Medical Nutrition Therapy  Appt Start Time: 3:07    End Time: 4:13  Patient was seen on 12/03/2022 for Pre-Operative Nutrition Assessment. Purpose of todays visit  enhance perioperative outcomes along with a healthy weight maintenance   Referral stated Supervised Weight Loss (SWL) visits needed: 0  Not cleared at this time:  Pt to follow up for minimum of one more visit to assist pt with progressing through stages of change/further nutrition education. RD advised pt that this follow up visit is not mandated through insurance. Pt verbalized agreement.   Planned surgery: Sleeve Gastrectomy Pt expectation of surgery: to be about 200 lbs   NUTRITION ASSESSMENT   Anthropometrics  Start weight at [facility]: 276.3 lbs (date: 12/03/2022)  Height: 64 in BMI: 47.43 kg/m2     Clinical   Pharmacotherapy: History of weight loss medication used: Ozempic, phentermine  Medical hx: HTN, arthritis Medications: oxycodone, Flexeril, vit D, lisinopril Labs: not available in EMR Notable signs/symptoms: none noted Any previous deficiencies? No  Evaluation of Nutritional Deficiencies: Micronutrient Nutrition Focused Physical Exam: Hair: No issues observed Eyes: No issues observed Mouth: No issues observed Neck: No issues observed Nails: No issues observed Skin: No issues observed  Lifestyle & Dietary Hx  Pt arrived with husband.  Pt states she just got a bike yesterday, stating she bet her husband she will use it. Pt states she likes anything sweet.  Pt states she eats out a lot.  Current Physical Activity Recommendations state 150 minutes per week of moderate to vigorous movement including Cardio and 1-2 days of resistance activities as well as flexibility/balance activities:  Pts current physical activity: ADLs (walked around the track 3 times, with 10% recommendation reached   Sleep  Hygiene: duration and quality: hurts when she sleeps, but states she sleeps well.  Current Patient Perceived Stress Level as stated by pt on a scale of 1-10:  5       Stress Management Techniques: mind her own business, and lay down somewhere, keep mouth shut.  According to the Dietary Guidelines for Americans Recommendation: equivalent 1.5-2 cups fruits per day, equivalent 2-3 cups vegetables per day and at least half all grains whole  Fruit servings per day (on average): 0, meeting 0% recommendation  Non-starchy vegetable servings per day (on average): 2, meeting 66-100% recommendation  Whole Grains per day (on average): 0  Number of meals missed/skipped per week out of 21: 7  24-Hr Dietary Recall First Meal: skip  Snack:  Second Meal (12:00): mayflower fried fish, hush puppies, french fries Snack:  Third Meal: [place] mike sub Snack: oreo shake, cookies Beverages: soda, Snapple, juice  Alcoholic beverages per week: 0   Estimated Energy Needs Calories: 1600  NUTRITION DIAGNOSIS  Overweight/obesity ([place]-3.3) related to past poor dietary habits and physical inactivity as evidenced by patient w/ planned sleeve surgery following dietary guidelines for continued weight loss.  NUTRITION INTERVENTION  Nutrition counseling (C-1) and education (E-2) to facilitate bariatric surgery goals.  Educated pt on micronutrient deficiencies post-surgery and behavioral/dietary strategies to start in order to mitigate that risk   Behavioral and Dietary Interventions Pre-Op Goals Reviewed with the Patient Nutrition: Healthy Eating Behaviors Switch to non-caloric, non-carbonated and non-caffeinated beverages such as  water, unsweetened tea, Crystal Light and zero calorie beverages (aim for 64 oz. per day) Cut out grazing between meals or at night  Find a protein shake you like Eat every 3-5 hours  Eliminate distractions while eating (TV, computer, reading, driving, texting) Take 10-27 minutes  to eat a meal  Decrease high sugar foods/decrease high fat/fried foods Eliminate alcoholic beverages Increase protein intake (eggs, fish, chicken, yogurt) before surgery Eat non starchy vegetables 2 times a day 7 days a week Eat complex carbohydrates such as whole grains and fruits   Behavioral Modification: Physical Activity Increase my usual daily activity (use stairs, park farther, etc.) Engage in _______________________  activity  _______ minutes ______ times per week  Other:    _________________________________________________________________     Problem Solving I will think about my usual eating patterns and how to tweak them How can my friends and family support me Barriers to starting my changes Learn and understand appetite verses hunger   Healthy Coping Allow for ___________ activities per week to help me manage stress Reframe negative thoughts I will keep a picture of someone or something that is my inspiration & look at it daily   Monitoring  Weigh myself once a week  Measure my progress by monitoring how my clothes fit Keep a food record of what I eat and drink for the next ________ (time period) Take pictures of what I eat and drink for the next ________ (time period) Use an app to count steps/day for the next_______ (time period) Measure my progress such as increased energy and more restful sleep Monitor your acid reflux and bowel habits, are they getting better?   *Goals that are bolded indicate the pt would like to start working towards these  Handouts Provided Include  Bariatric Surgery handouts (Nutrition Visits, Pre Surgery Behavioral Change Goals, Protein Shakes Brands to Choose From, Vitamins & Mineral Supplementation)  Learning Style & Readiness for Change Teaching method utilized: Visual, Auditory, and hands on  Demonstrated degree of understanding via: Teach Back  Readiness Level: preparation Barriers to learning/adherence to lifestyle change: affinity  for sweets and fried foods  RD's Notes for Next Visit Progress toward patient's chosen goals     MONITORING & EVALUATION Dietary intake, weekly physical activity, body weight, and preoperative behavioral change goals   Next Steps  Patient is to follow up at [facility] in two weeks for evaluation, monitoring and additional education.

## 2022-12-04 ENCOUNTER — Other Ambulatory Visit (HOSPITAL_COMMUNITY): Payer: Self-pay

## 2022-12-10 ENCOUNTER — Encounter (HOSPITAL_COMMUNITY): Payer: Self-pay | Admitting: Surgery

## 2022-12-11 ENCOUNTER — Other Ambulatory Visit (HOSPITAL_COMMUNITY): Payer: Self-pay

## 2022-12-14 ENCOUNTER — Other Ambulatory Visit (HOSPITAL_COMMUNITY): Payer: Self-pay

## 2022-12-15 ENCOUNTER — Other Ambulatory Visit (HOSPITAL_COMMUNITY): Payer: Self-pay

## 2022-12-20 ENCOUNTER — Other Ambulatory Visit (HOSPITAL_COMMUNITY): Payer: Self-pay

## 2022-12-21 ENCOUNTER — Encounter (HOSPITAL_COMMUNITY): Payer: Self-pay | Admitting: Surgery

## 2022-12-21 ENCOUNTER — Ambulatory Visit (HOSPITAL_COMMUNITY): Payer: BC Managed Care – PPO | Admitting: Anesthesiology

## 2022-12-21 ENCOUNTER — Encounter (HOSPITAL_COMMUNITY)
Admission: RE | Disposition: A | Discharge status: Home / Self Care | Payer: Self-pay | Source: Home / Self Care | Attending: Surgery

## 2022-12-21 ENCOUNTER — Ambulatory Visit (HOSPITAL_COMMUNITY)
Admission: RE | Admit: 2022-12-21 | Discharge: 2022-12-21 | Disposition: A | Discharge status: Home / Self Care | Payer: BC Managed Care – PPO | Attending: Surgery | Admitting: Surgery

## 2022-12-21 ENCOUNTER — Other Ambulatory Visit: Payer: Self-pay

## 2022-12-21 DIAGNOSIS — Z6841 Body Mass Index (BMI) 40.0 and over, adult: Secondary | ICD-10-CM | POA: Insufficient documentation

## 2022-12-21 DIAGNOSIS — K219 Gastro-esophageal reflux disease without esophagitis: Secondary | ICD-10-CM | POA: Insufficient documentation

## 2022-12-21 HISTORY — PX: BIOPSY: SHX5522

## 2022-12-21 HISTORY — PX: ESOPHAGOGASTRODUODENOSCOPY: SHX5428

## 2022-12-21 SURGERY — EGD (ESOPHAGOGASTRODUODENOSCOPY)
Anesthesia: Monitor Anesthesia Care

## 2022-12-21 MED ORDER — PROPOFOL 500 MG/50ML IV EMUL
INTRAVENOUS | Status: DC | PRN
  Administered 2022-12-21: 60 mg via INTRAVENOUS
  Administered 2022-12-21: 125 ug/kg/min via INTRAVENOUS

## 2022-12-21 MED ORDER — LACTATED RINGERS IV SOLN
INTRAVENOUS | Status: DC
  Administered 2022-12-21: 1000 mL via INTRAVENOUS

## 2022-12-21 NOTE — Transfer of Care (Signed)
Immediate Anesthesia Transfer of Care Note  Patient: Olivia Schmidt  Procedure(s) Performed: Procedure(s): ESOPHAGOGASTRODUODENOSCOPY (EGD) (N/A) BIOPSY  Patient Location: PACU and Endoscopy Unit  Anesthesia Type:MAC  Level of Consciousness: awake, alert  and oriented  Airway & Oxygen Therapy: Patient Spontanous Breathing and Patient connected to nasal cannula oxygen  Post-op Assessment: Report given to RN and Post -op Vital signs reviewed and stable  Post vital signs: Reviewed and stable  Last Vitals:  Vitals:   12/21/22 1020  BP: (!) 180/86  Pulse: 70  Resp: 12  Temp: 36.6 C  SpO2: 99%    Complications: No apparent anesthesia complications

## 2022-12-21 NOTE — Anesthesia Preprocedure Evaluation (Signed)
Anesthesia Evaluation  Patient identified by MRN, date of birth, ID band Patient awake    Reviewed: Allergy & Precautions, H&P , NPO status , Patient's Chart, lab work & pertinent test results  Airway Mallampati: II   Neck ROM: full    Dental   Pulmonary former smoker   breath sounds clear to auscultation       Cardiovascular hypertension, + dysrhythmias Atrial Fibrillation  Rhythm:regular Rate:Normal     Neuro/Psych    GI/Hepatic ,GERD  ,,  Endo/Other   Hyperthyroidism Morbid obesity  Renal/GU      Musculoskeletal   Abdominal   Peds  Hematology   Anesthesia Other Findings   Reproductive/Obstetrics                             Anesthesia Physical Anesthesia Plan  ASA: 3  Anesthesia Plan: MAC   Post-op Pain Management:    Induction: Intravenous  PONV Risk Score and Plan: 2 and Propofol infusion and Treatment may vary due to age or medical condition  Airway Management Planned: Nasal Cannula  Additional Equipment:   Intra-op Plan:   Post-operative Plan:   Informed Consent: I have reviewed the patients History and Physical, chart, labs and discussed the procedure including the risks, benefits and alternatives for the proposed anesthesia with the patient or authorized representative who has indicated his/her understanding and acceptance.     Dental advisory given  Plan Discussed with: CRNA, Anesthesiologist and Surgeon  Anesthesia Plan Comments:        Anesthesia Quick Evaluation

## 2022-12-21 NOTE — Op Note (Signed)
[facility] Patient Name: Olivia Schmidt Procedure Date: 12/21/2022 MRN: [id number] Attending MD: Quentin Ore , ,  Date of Birth: 05-Aug-1960 CSN: [id number] Age: 62 Admit Type: Inpatient Procedure:                Upper GI endoscopy Indications:               Providers:                Quentin Ore, Kandice Robinsons, Technician,                            Norman Clay, RN Referring MD:              Medicines:                Monitored Anesthesia Care Complications:            No immediate complications. Estimated Blood Loss:     Estimated blood loss was minimal. Procedure:                Pre-Anesthesia Assessment:                           - Prior to the procedure, a History and Physical                            was performed, and patient medications and                            allergies were reviewed. The patient is competent.                            The risks and benefits of the procedure and the                            sedation options and risks were discussed with the                            patient. All questions were answered and informed                            consent was obtained. Patient identification and                            proposed procedure were verified by the physician                            in the pre-procedure area. Mental Status                            Examination: alert and oriented. Airway                            Examination: normal oropharyngeal airway and neck                            mobility. Respiratory Examination: clear  to                            auscultation. CV Examination: normal. ASA Grade                            Assessment: II - A patient with mild systemic                            disease. After reviewing the risks and benefits,                            the patient was deemed in satisfactory condition to                            undergo the procedure. The anesthesia plan was to                             use monitored anesthesia care (MAC). Immediately                            prior to administration of medications, the patient                            was re-assessed for adequacy to receive sedatives.                            The heart rate, respiratory rate, oxygen                            saturations, blood pressure, adequacy of pulmonary                            ventilation, and response to care were monitored                            throughout the procedure. The physical status of                            the patient was re-assessed after the procedure.                           After obtaining informed consent, the endoscope was                            passed under direct vision. Throughout the                            procedure, the patient's blood pressure, pulse, and                            oxygen saturations were monitored continuously. The  [id number]-H190 ([id number]) Olympus endoscope was introduced                            through the mouth, and advanced to the second part                            of duodenum. The upper GI endoscopy was                            accomplished without difficulty. The patient                            tolerated the procedure well. Scope In: Scope Out: Findings:      The examined esophagus was normal.      The gastroesophageal flap valve was visualized endoscopically and       classified as Hill Grade I (prominent fold, tight to endoscope).      The entire examined stomach was normal. Biopsies were taken with a cold       forceps for Helicobacter pylori testing. Verification of patient       identification for the specimen was done. Estimated blood loss was       minimal.      The examined duodenum was normal. Impression:               - Normal esophagus.                           - Gastroesophageal flap valve classified as Hill                            Grade I  (prominent fold, tight to endoscope).                           - Normal stomach. Biopsied.                           - Normal examined duodenum. Moderate Sedation:      Moderate (conscious) sedation was personally administered by an       anesthesia professional. The following parameters were monitored: oxygen       saturation, heart rate, blood pressure, and response to care. Total       physician intraservice time was 15 minutes. Recommendation:           - Discharge patient to home. Procedure Code(s):        --- Professional ---                           [phone], Esophagogastroduodenoscopy, flexible,                            transoral; with biopsy, single or multiple Diagnosis Code(s):        --- Professional ---                           V56.433, Encounter for other preprocedural  examination CPT copyright 2022 American [employer] Association. All rights reserved. The codes documented in this report are preliminary and upon coder review may  be revised to meet current compliance requirements. Olivia Schmidt,  12/21/2022 12:01:13 PM Number of Addenda: 0

## 2022-12-21 NOTE — H&P (Signed)
Admitting Physician: Hyman Hopes Aarthi Brozowski  Service: Bariatric Surgery  CC: EGD prior to sleeve  Subjective   HPI: Olivia Schmidt is an 62 y.o. female who is here for EGD prior to sleeve  Past Medical History:  Diagnosis Date   Acute bronchitis 05/08/2015   Anemia 05/07/2015   Atrial fibrillation with rapid ventricular response (HCC)    Chest pain 05/07/2015   FEMALE INFERTILITY 10/21/2008   Qualifier: Diagnosis of  By: Delrae Alfred MD, Elizabeth     Fibroids    FIBROIDS, UTERUS 07/11/2009   Qualifier: Diagnosis of  By: Delrae Alfred MD, Agapito Games    Hypertension    Hyperthyroidism 05/07/2015   Hypokalemia 05/08/2015   Hypomagnesemia 05/08/2015   PAF (paroxysmal atrial fibrillation) (HCC) 05/09/2015   Thrombocytopenia (HCC) 05/09/2015    Past Surgical History:  Procedure Laterality Date   ABDOMINAL HYSTERECTOMY     I & D EXTREMITY Right 10/27/2015   Procedure: IRRIGATION AND DEBRIDEMENT RIGHT SMALL FINGER;  Surgeon: Betha Loa, MD;  Location: [facility];  Service: Orthopedics;  Laterality: Right;  With local block   THYROIDECTOMY     TOTAL SHOULDER ARTHROPLASTY Right 10/06/2021   Procedure: TOTAL SHOULDER ARTHROPLASTY;  Surgeon: Beverely Low, MD;  Location: [facility];  Service: Orthopedics;  Laterality: Right;  with ISB    Family History  Problem Relation Age of Onset   Aneurysm Father        brain   Hypertension Sister    Hypertension Brother    Thyroid disease Neg Hx    Colon cancer Neg Hx    Esophageal cancer Neg Hx    Rectal cancer Neg Hx    Stomach cancer Neg Hx     Social:  reports that she quit smoking about 19 years ago. Her smoking use included cigarettes. She started smoking about 43 years ago. She has never used smokeless tobacco. She reports that she does not drink alcohol and does not use drugs.  Allergies: No Known Allergies  Medications: Current Outpatient Medications  Medication Instructions   cyclobenzaprine (FLEXERIL) 10 MG tablet  Take 1 tablet (10 mg total) by mouth 3 (three) times daily as needed for muscle spasms   lisinopril (ZESTRIL) 10 mg, Oral, Every evening   metFORMIN (GLUCOPHAGE) 500 MG tablet Take 1 tablet by mouth daily   metoprolol succinate (TOPROL-XL) 25 mg, Oral, Daily   naloxone (NARCAN) nasal spray 4 mg/0.1 mL Spray 1 spray as needed as directed.   naproxen sodium (ALEVE) 440 mg, Oral, 2 times daily PRN   oxyCODONE-acetaminophen (PERCOCET) 10-325 MG tablet 1 tablet, Oral, 5 times daily PRN   Ozempic (1 MG/DOSE) 1 mg, Subcutaneous, Weekly   phentermine (ADIPEX-P) 37.5 mg, Oral, Daily   pravastatin (PRAVACHOL) 80 mg, Oral, Every evening   Vitamin D (Ergocalciferol) (DRISDOL) 50,000 Units, Oral, Weekly    ROS - all of the below systems have been reviewed with the patient and positives are indicated with bold text General: chills, fever or night sweats Eyes: blurry vision or double vision ENT: epistaxis or sore throat Allergy/Immunology: itchy/watery eyes or nasal congestion Hematologic/Lymphatic: bleeding problems, blood clots or swollen lymph nodes Endocrine: temperature intolerance or unexpected weight changes Breast: new or changing breast lumps or nipple discharge Resp: cough, shortness of breath, or wheezing CV: chest pain or dyspnea on exertion GI: as per HPI GU: dysuria, trouble voiding, or hematuria MSK: joint pain or joint stiffness Neuro: TIA or stroke symptoms Derm: pruritus and skin lesion changes Psych:  anxiety and depression  Objective   PE Blood pressure (!) 180/86, pulse 70, temperature 97.8 F (36.6 C), temperature source Temporal, resp. rate 12, height 5\' 4"  (1.626 m), weight 122 kg, last menstrual period 09/14/2010, SpO2 99%. Constitutional: NAD; conversant; no deformities Eyes: Moist conjunctiva; no lid lag; anicteric; PERRL Neck: Trachea midline; no thyromegaly Lungs: Normal respiratory effort; no tactile fremitus CV: RRR; no palpable thrills; no pitting edema GI: Abd  Soft, nontender; no palpable hepatosplenomegaly MSK: Normal range of motion of extremities; no clubbing/cyanosis Psychiatric: Appropriate affect; alert and oriented x3 Lymphatic: No palpable cervical or axillary lymphadenopathy  No results found for this or any previous visit (from the past 24 hour(s)).  Imaging Orders  No imaging studies ordered today     Assessment and Plan   Olivia Schmidt is an 62 y.o. female with obesity (BMI 46) interested in sleeve gastrectomy.  She presents today for preoperative EGD.  We discussed the procedure, its risks, benefits and alterantives and the patient granted consent to proceed.   Quentin Ore, MD  [facility] Use [email or web address] to contact on call provider

## 2022-12-24 ENCOUNTER — Encounter (HOSPITAL_COMMUNITY): Payer: Self-pay | Admitting: Surgery

## 2022-12-24 LAB — SURGICAL PATHOLOGY

## 2022-12-24 NOTE — Anesthesia Postprocedure Evaluation (Signed)
Anesthesia Post Note  Patient: Olivia Schmidt  Procedure(s) Performed: ESOPHAGOGASTRODUODENOSCOPY (EGD) BIOPSY     Patient location during evaluation: Endoscopy Anesthesia Type: MAC Level of consciousness: awake and alert Pain management: pain level controlled Vital Signs Assessment: post-procedure vital signs reviewed and stable Respiratory status: spontaneous breathing, nonlabored ventilation, respiratory function stable and patient connected to nasal cannula oxygen Cardiovascular status: stable and blood pressure returned to baseline Postop Assessment: no apparent nausea or vomiting Anesthetic complications: no   No notable events documented.  Last Vitals:  Vitals:   12/21/22 1220 12/21/22 1224  BP: (!) 170/94 (!) 145/73  Pulse: 67 64  Resp: 13 17  Temp:    SpO2: 99% 99%    Last Pain:  Vitals:   12/21/22 1224  TempSrc:   PainSc: 0-No pain                 Evaleen Sant S

## 2022-12-25 ENCOUNTER — Encounter: Payer: BC Managed Care – PPO | Attending: Surgery | Admitting: Dietician

## 2022-12-25 ENCOUNTER — Encounter: Payer: Self-pay | Admitting: Dietician

## 2022-12-25 VITALS — Ht 64.0 in | Wt 271.4 lb

## 2022-12-25 DIAGNOSIS — E669 Obesity, unspecified: Secondary | ICD-10-CM | POA: Insufficient documentation

## 2022-12-25 NOTE — Progress Notes (Signed)
Supervised Weight Loss Visit Bariatric Nutrition Education Appt Start Time: 8:34    End Time: 9:02  Referral stated Supervised Weight Loss (SWL) visits needed: 0  Pt completed visits.   Pt has cleared nutrition requirements.   Planned surgery: Sleeve Gastrectomy Pt expectation of surgery: to be about 200 lbs   NUTRITION ASSESSMENT   Anthropometrics  Start weight at [facility]: 276.3 lbs (date: 12/03/2022)  Height: 64 in Weight: 271.4 lbs BMI: 46.59 kg/m2     Clinical   Pharmacotherapy: History of weight loss medication used: Ozempic, phentermine  Medical hx: HTN, arthritis Medications: oxycodone, Flexeril, vit D, lisinopril Labs: not available in EMR Notable signs/symptoms: none noted Any previous deficiencies? No  Lifestyle & Dietary Hx  Pt states she drank no sugar sweetened beverages or fried food, stating she has been doing real good. Pt states she has found a protein shake that meets the criteria given.  Supplements: vit D Current average weekly physical activity: ADLs (walked around the track 3 times)  24-Hr Dietary Recall First Meal: skip  Snack:  Second Meal (12:00): mayflower fried fish, hush puppies, french fries Snack:  Third Meal: [place] mike sub Snack: oreo shake, cookies Beverages: soda, Snapple, juice  Estimated Energy Needs Calories: 1600  NUTRITION DIAGNOSIS  Overweight/obesity ([place]-3.3) related to past poor dietary habits and physical inactivity as evidenced by patient w/ planned sleeve surgery following dietary guidelines for continued weight loss.   NUTRITION INTERVENTION  Nutrition counseling (C-1) and education (E-2) to facilitate bariatric surgery goals.  Encouraged patient to honor their body's internal hunger and fullness cues.  Throughout the day, check in mentally and rate hunger. Stop eating when satisfied not full regardless of how much food is left on the plate.  Get more if still hungry 20-30 minutes later.  The key is to honor  satisfaction so throughout the meal, rate fullness factor and stop when comfortably satisfied not physically full. The key is to honor hunger and fullness without any feelings of guilt or shame.  Pay attention to what the internal cues are, rather than any external factors. This will enhance the confidence you have in listening to your own body and following those internal cues enabling you to increase how often you eat when you are hungry not out of appetite and stop when you are satisfied not full.  Encouraged pt to continue to eat balanced meals inclusive of non starchy vegetables 2 times a day 7 days a week Encouraged pt to choose lean protein sources: limiting beef, pork, sausage, hotdogs, and lunch meat Encourage pt to choose healthy fats such as plant based limiting animal fats Encouraged pt to continue to drink a minium 64 fluid ounces with half being plain water to satisfy proper hydration    Pre-Op Goals Reviewed with the Patient Nutrition: Healthy Eating Behaviors Switch to non-caloric, non-carbonated and non-caffeinated beverages such as  water, unsweetened tea, Crystal Light and zero calorie beverages (aim for 64 oz. per day) Cut out grazing between meals or at night  Find a protein shake you like Eat every 3-5 hours        Eliminate distractions while eating (TV, computer, reading, driving, texting) Take 56-21 minutes to eat a meal  Decrease high sugar foods/decrease high fat/fried foods Eliminate alcoholic beverages Increase protein intake (eggs, fish, chicken, yogurt) before surgery Eat non starchy vegetables 2 times a day 7 days a week Eat complex carbohydrates such as whole grains and fruits   Behavioral Modification: Physical Activity Increase my usual  daily activity (use stairs, park farther, etc.) Engage in _______________________  activity  _______ minutes ______ times per week  Other:    _________________________________________________________________     Problem  Solving I will think about my usual eating patterns and how to tweak them How can my friends and family support me Barriers to starting my changes Learn and understand appetite verses hunger   Healthy Coping Allow for ___________ activities per week to help me manage stress Reframe negative thoughts I will keep a picture of someone or something that is my inspiration & look at it daily   Monitoring  Weigh myself once a week  Measure my progress by monitoring how my clothes fit Keep a food record of what I eat and drink for the next ________ (time period) Take pictures of what I eat and drink for the next ________ (time period) Use an app to count steps/day for the next_______ (time period) Measure my progress such as increased energy and more restful sleep Monitor your acid reflux and bowel habits, are they getting better?   *Goals that are bolded indicate the pt would like to start working towards these  Handouts Provided Include  Bariatric MyPlate Protein Shake Criteria/brands  Learning Style & Readiness for Change Teaching method utilized: Visual & Auditory  Demonstrated degree of understanding via: Teach Back  Readiness Level: Preparation Barriers to learning/adherence to lifestyle change: affinity for sweets and fried foods  RD's Notes for next Visit  Pt progress toward chosen goals  MONITORING & EVALUATION Dietary intake, weekly physical activity, body weight, and pre-op goals in 1 month.   Next Steps  Pt has completed visits. No further supervised visits required/recommended. Patient is to return to [facility] for pre-op class >2 weeks prior to scheduled surgery.

## 2022-12-26 ENCOUNTER — Other Ambulatory Visit (HOSPITAL_COMMUNITY): Payer: Self-pay

## 2022-12-31 ENCOUNTER — Other Ambulatory Visit (HOSPITAL_COMMUNITY): Payer: Self-pay

## 2022-12-31 MED ORDER — ERGOCALCIFEROL 1.25 MG (50000 UT) PO CAPS
1.0000 | ORAL_CAPSULE | ORAL | 1 refills | Status: DC
  Filled 2022-12-31 – 2023-01-12 (×2): qty 12, 84d supply, fill #0

## 2022-12-31 MED ORDER — CYCLOBENZAPRINE HCL 10 MG PO TABS
10.0000 mg | ORAL_TABLET | Freq: Three times a day (TID) | ORAL | 0 refills | Status: DC | PRN
  Filled 2022-12-31 – 2023-01-12 (×2): qty 90, 30d supply, fill #0

## 2022-12-31 MED ORDER — NAPROXEN SODIUM ER 375 MG PO TB24
1.0000 | ORAL_TABLET | Freq: Every day | ORAL | 1 refills | Status: DC
  Filled 2022-12-31: qty 90, 90d supply, fill #0

## 2022-12-31 MED ORDER — OXYCODONE-ACETAMINOPHEN 10-325 MG PO TABS
1.0000 | ORAL_TABLET | Freq: Every day | ORAL | 0 refills | Status: AC | PRN
  Filled 2023-01-12: qty 150, 32d supply, fill #0

## 2023-01-08 ENCOUNTER — Other Ambulatory Visit (HOSPITAL_COMMUNITY): Payer: Self-pay

## 2023-01-12 ENCOUNTER — Other Ambulatory Visit (HOSPITAL_COMMUNITY): Payer: Self-pay

## 2023-01-14 ENCOUNTER — Encounter: Payer: BC Managed Care – PPO | Attending: Surgery | Admitting: Skilled Nursing Facility1

## 2023-01-14 ENCOUNTER — Other Ambulatory Visit: Payer: Self-pay

## 2023-01-14 ENCOUNTER — Encounter: Payer: Self-pay | Admitting: Skilled Nursing Facility1

## 2023-01-14 ENCOUNTER — Other Ambulatory Visit (HOSPITAL_COMMUNITY): Payer: Self-pay

## 2023-01-14 NOTE — Progress Notes (Signed)
Pre-Operative Nutrition Class:    Patient was seen on 01/14/2023 for Pre-Operative Bariatric Surgery Education at [facility]    Surgery date: 02/04/2023 Surgery type: Sleeve Start weight at [facility]: 276.3 Weight today: 272   The following the learning objectives were met by the patient during this course: Identify Pre-Op Dietary Goals and will begin 2 weeks pre-operatively Identify appropriate sources of fluids and proteins  State protein recommendations and appropriate sources pre and post-operatively Identify Post-Operative Dietary Goals and will follow for 2 weeks post-operatively Identify appropriate multivitamin and calcium sources Describe the need for physical activity post-operatively and will follow MD recommendations State when to call healthcare provider regarding medication questions or post-operative complications When having a diagnosis of diabetes understanding hypoglycemia symptoms and the inclusion of 1 complex carbohydrate per meal  Handouts given during class include: Pre-Op Bariatric Surgery Diet Handout Protein Shake Handout Post-Op Bariatric Surgery Nutrition Handout BELT Program Information Flyer Support Group Information Flyer [facility] Bariatric Supplements Price List  Follow-Up Plan: Patient will follow-up at [facility] 2 weeks post operatively for diet advancement per MD.

## 2023-01-17 ENCOUNTER — Ambulatory Visit: Payer: Self-pay | Admitting: Surgery

## 2023-01-17 DIAGNOSIS — Z01818 Encounter for other preprocedural examination: Secondary | ICD-10-CM

## 2023-01-17 NOTE — Progress Notes (Signed)
Sent message, via epic in basket, requesting orders in epic from surgeon.  

## 2023-01-22 NOTE — Progress Notes (Signed)
Anesthesia Review:  PCP: Cardiologist : Chest x-ray : 11/28/22- 2 view  EKG : 11/22/22  Echo : Stress test: Cardiac Cath :  Activity level:  Sleep Study/ CPAP : Fasting Blood Sugar :      / Checks Blood Sugar -- times a day:   Blood Thinner/ Instructions /Last Dose: ASA / Instructions/ Last Dose :    12/21/22- endoscopy

## 2023-01-22 NOTE — Patient Instructions (Signed)
SURGICAL WAITING ROOM VISITATION  Patients having surgery or a procedure may have no more than 2 support people in the waiting area - these visitors may rotate.    Children under the age of 62 must have an adult with them who is not the patient.  Due to an increase in RSV and influenza rates and associated hospitalizations, children ages 79 and under may not visit patients in [facility] hospitals.  If the patient needs to stay at the hospital during part of their recovery, the visitor guidelines for inpatient rooms apply. Pre-op nurse will coordinate an appropriate time for 1 support person to accompany patient in pre-op.  This support person may not rotate.    Please refer to the [facility] website for the visitor guidelines for Inpatients (after your surgery is over and you are in a regular room).       Your procedure is scheduled on:  02/04/23    Report to [facility] Main Entrance    Report to admitting at   [phone]   Call this number if you have problems the morning of surgery [phone]   Do not eat food :After Midnight.   After Midnight you may have the following liquids until _ 0430_____ AM DAY OF SURGERY  Water Non-Citrus Juices (without pulp, NO RED-Apple, White grape, White cranberry) Black Coffee (NO MILK/CREAM OR CREAMERS, sugar ok)  Clear Tea (NO MILK/CREAM OR CREAMERS, sugar ok) regular and decaf                             Plain Jell-O (NO RED)                                           Fruit ices (not with fruit pulp, NO RED)                                     Popsicles (NO RED)                                                               Sports drinks like Gatorade (NO RED)         The day of surgery:  Drink ONE (1) Pre-Surgery Clear Ensure or G2 at 0430 AM ( have completed by )  the morning of surgery. Drink in one sitting. Do not sip.  This drink was given to you during your hospital  pre-op appointment visit. Nothing else to drink after  completing the  Pre-Surgery Clear Ensure or G2.          If you have questions, please contact your surgeon's office.       Oral Hygiene is also important to reduce your risk of infection.                                    Remember - BRUSH YOUR TEETH THE MORNING OF SURGERY WITH YOUR REGULAR TOOTHPASTE  DENTURES WILL BE REMOVED PRIOR TO SURGERY PLEASE  DO NOT APPLY "Poly grip" OR ADHESIVES!!!   Do NOT smoke after Midnight   Stop all vitamins and herbal supplements 7 days before surgery.   Take these medicines the morning of surgery with A SIP OF WATER:  none   DO NOT TAKE ANY ORAL DIABETIC MEDICATIONS DAY OF YOUR SURGERY  Bring CPAP mask and tubing day of surgery.                              You may not have any metal on your body including hair pins, jewelry, and body piercing             Do not wear make-up, lotions, powders, perfumes/cologne, or deodorant  Do not wear nail polish including gel and S&S, artificial/acrylic nails, or any other type of covering on natural nails including finger and toenails. If you have artificial nails, gel coating, etc. that needs to be removed by a nail salon please have this removed prior to surgery or surgery may need to be canceled/ delayed if the surgeon/ anesthesia feels like they are unable to be safely monitored.   Do not shave  48 hours prior to surgery.               Men may shave face and neck.   Do not bring valuables to the hospital. [place] IS NOT             RESPONSIBLE   FOR VALUABLES.   Contacts, glasses, dentures or bridgework may not be worn into surgery.   Bring small overnight bag day of surgery.   DO NOT BRING YOUR HOME MEDICATIONS TO THE HOSPITAL. PHARMACY WILL DISPENSE MEDICATIONS LISTED ON YOUR MEDICATION LIST TO YOU DURING YOUR ADMISSION IN THE HOSPITAL!    Patients discharged on the day of surgery will not be allowed to drive home.  Someone NEEDS to stay with you for the first 24 hours after  anesthesia.   Special Instructions: Bring a copy of your healthcare power of attorney and living will documents the day of surgery if you haven't scanned them before.              Please read over the following fact sheets you were given: IF YOU HAVE QUESTIONS ABOUT YOUR PRE-OP INSTRUCTIONS PLEASE CALL [phone]   If you received a COVID test during your pre-op visit  it is requested that you wear a mask when out in public, stay away from anyone that may not be feeling well and notify your surgeon if you develop symptoms. If you test positive for Covid or have been in contact with anyone that has tested positive in the last 10 days please notify you surgeon.     - Preparing for Surgery Before surgery, you can play an important role.  Because skin is not sterile, your skin needs to be as free of germs as possible.  You can reduce the number of germs on your skin by washing with CHG (chlorahexidine gluconate) soap before surgery.  CHG is an antiseptic cleaner which kills germs and bonds with the skin to continue killing germs even after washing. Please DO NOT use if you have an allergy to CHG or antibacterial soaps.  If your skin becomes reddened/irritated stop using the CHG and inform your nurse when you arrive at Short Stay. Do not shave (including legs and underarms) for at least 48 hours prior to the first CHG shower.  You may shave your face/neck. Please follow these instructions carefully:  1.  Shower with CHG Soap the night before surgery and the  morning of Surgery.  2.  If you choose to wash your hair, wash your hair first as usual with your  normal  shampoo.  3.  After you shampoo, rinse your hair and body thoroughly to remove the  shampoo.                           4.  Use CHG as you would any other liquid soap.  You can apply chg directly  to the skin and wash                       Gently with a scrungie or clean washcloth.  5.  Apply the CHG Soap to your body ONLY FROM THE  NECK DOWN.   Do not use on face/ open                           Wound or open sores. Avoid contact with eyes, ears mouth and genitals (private parts).                       Wash face,  Genitals (private parts) with your normal soap.             6.  Wash thoroughly, paying special attention to the area where your surgery  will be performed.  7.  Thoroughly rinse your body with warm water from the neck down.  8.  DO NOT shower/wash with your normal soap after using and rinsing off  the CHG Soap.                9.  Pat yourself dry with a clean towel.            10.  Wear clean pajamas.            11.  Place clean sheets on your bed the night of your first shower and do not  sleep with pets. Day of Surgery : Do not apply any lotions/deodorants the morning of surgery.  Please wear clean clothes to the hospital/surgery center.  FAILURE TO FOLLOW THESE INSTRUCTIONS MAY RESULT IN THE CANCELLATION OF YOUR SURGERY PATIENT SIGNATURE_________________________________  NURSE SIGNATURE__________________________________  ________________________________________________________________________

## 2023-01-24 ENCOUNTER — Encounter (HOSPITAL_COMMUNITY)
Admission: RE | Admit: 2023-01-24 | Discharge: 2023-01-24 | Disposition: A | Discharge status: Home / Self Care | Payer: BC Managed Care – PPO | Source: Ambulatory Visit | Attending: Surgery | Admitting: Surgery

## 2023-01-24 ENCOUNTER — Encounter (HOSPITAL_COMMUNITY): Payer: Self-pay

## 2023-01-24 ENCOUNTER — Other Ambulatory Visit: Payer: Self-pay

## 2023-01-24 VITALS — BP 161/84 | HR 62 | Temp 98.3°F | Resp 16 | Ht 64.0 in | Wt 268.0 lb

## 2023-01-24 DIAGNOSIS — Z01812 Encounter for preprocedural laboratory examination: Secondary | ICD-10-CM | POA: Insufficient documentation

## 2023-01-24 DIAGNOSIS — Z01818 Encounter for other preprocedural examination: Secondary | ICD-10-CM

## 2023-01-24 HISTORY — DX: Unspecified osteoarthritis, unspecified site: M19.90

## 2023-01-24 LAB — COMPREHENSIVE METABOLIC PANEL
ALT: 15 U/L (ref 0–44)
AST: 17 U/L (ref 15–41)
Albumin: 3.8 g/dL (ref 3.5–5.0)
Alkaline Phosphatase: 93 U/L (ref 38–126)
Anion gap: 7 (ref 5–15)
BUN: 16 mg/dL (ref 6–20)
CO2: 24 mmol/L (ref 22–32)
Calcium: 8.7 mg/dL — ABNORMAL LOW (ref 8.9–10.3)
Chloride: 105 mmol/L (ref 98–111)
Creatinine, Ser: 0.67 mg/dL (ref 0.44–1.00)
GFR, Estimated: >60 mL/min (ref >60)
Glucose, Bld: 99 mg/dL (ref 70–99)
Potassium: 4.1 mmol/L (ref 3.5–5.1)
Sodium: 136 mmol/L (ref 135–145)
Total Bilirubin: 0.6 mg/dL (ref 0.3–1.2)
Total Protein: 7.1 g/dL (ref 6.5–8.1)

## 2023-01-24 LAB — CBC WITH DIFFERENTIAL/PLATELET
Abs Immature Granulocytes: 0.01 10*3/uL (ref 0.00–0.07)
Basophils Absolute: 0 10*3/uL (ref 0.0–0.1)
Basophils Relative: 1 %
Eosinophils Absolute: 0.1 10*3/uL (ref 0.0–0.5)
Eosinophils Relative: 3 %
HCT: 38.5 % (ref 36.0–46.0)
Hemoglobin: 11.7 g/dL — ABNORMAL LOW (ref 12.0–15.0)
Immature Granulocytes: 0 %
Lymphocytes Relative: 28 %
Lymphs Abs: 1.2 10*3/uL (ref 0.7–4.0)
MCH: 26.8 pg (ref 26.0–34.0)
MCHC: 30.4 g/dL (ref 30.0–36.0)
MCV: 88.1 fL (ref 80.0–100.0)
Monocytes Absolute: 0.4 10*3/uL (ref 0.1–1.0)
Monocytes Relative: 9 %
Neutro Abs: 2.6 10*3/uL (ref 1.7–7.7)
Neutrophils Relative %: 59 %
Platelets: 176 10*3/uL (ref 150–400)
RBC: 4.37 MIL/uL (ref 3.87–5.11)
RDW: 14.6 % (ref 11.5–15.5)
WBC: 4.3 10*3/uL (ref 4.0–10.5)
nRBC: 0 % (ref 0.0–0.2)

## 2023-01-24 LAB — TYPE AND SCREEN
ABO/RH(D): O POS
Antibody Screen: NEGATIVE

## 2023-01-28 ENCOUNTER — Other Ambulatory Visit (HOSPITAL_COMMUNITY): Payer: Self-pay

## 2023-01-28 MED ORDER — OXYCODONE-ACETAMINOPHEN 10-325 MG PO TABS
1.0000 | ORAL_TABLET | Freq: Every day | ORAL | 0 refills | Status: AC | PRN
  Filled 2023-01-28 – 2023-02-12 (×2): qty 150, 30d supply, fill #0

## 2023-01-28 MED ORDER — CYCLOBENZAPRINE HCL 10 MG PO TABS
10.0000 mg | ORAL_TABLET | Freq: Three times a day (TID) | ORAL | 0 refills | Status: AC | PRN
  Filled 2023-01-28 – 2023-08-05 (×2): qty 90, 30d supply, fill #0

## 2023-01-30 ENCOUNTER — Other Ambulatory Visit (HOSPITAL_COMMUNITY): Payer: Self-pay

## 2023-02-03 NOTE — Anesthesia Preprocedure Evaluation (Signed)
Anesthesia Evaluation  Patient identified by MRN, date of birth, ID band Patient awake    Reviewed: Allergy & Precautions, NPO status , Patient's Chart, lab work & pertinent test results  Airway Mallampati: II  TM Distance: >3 FB Neck ROM: Full    Dental no notable dental hx. (+) Partial Upper, Partial Lower   Pulmonary former smoker   Pulmonary exam normal        Cardiovascular hypertension, + dysrhythmias Atrial Fibrillation  Rhythm:Regular Rate:Normal     Neuro/Psych negative neurological ROS  negative psych ROS   GI/Hepatic negative GI ROS, Neg liver ROS,,,  Endo/Other  diabetes, Type 2, Insulin Dependent    Renal/GU negative Renal ROS     Musculoskeletal  (+) Arthritis , Osteoarthritis,    Abdominal Normal abdominal exam  (+)   Peds  Hematology  (+) Blood dyscrasia, anemia   Anesthesia Other Findings   Reproductive/Obstetrics                             Anesthesia Physical Anesthesia Plan  ASA: 3  Anesthesia Plan: General   Post-op Pain Management: Celebrex PO (pre-op)* and Tylenol PO (pre-op)*   Induction: Intravenous  PONV Risk Score and Plan: 3 and Ondansetron, Dexamethasone, Midazolam and Treatment may vary due to age or medical condition  Airway Management Planned: Mask, Oral ETT and Video Laryngoscope Planned  Additional Equipment: None  Intra-op Plan:   Post-operative Plan: Extubation in OR  Informed Consent: I have reviewed the patients History and Physical, chart, labs and discussed the procedure including the risks, benefits and alternatives for the proposed anesthesia with the patient or authorized representative who has indicated his/her understanding and acceptance.     Dental advisory given  Plan Discussed with: CRNA  Anesthesia Plan Comments:        Anesthesia Quick Evaluation

## 2023-02-04 ENCOUNTER — Other Ambulatory Visit (HOSPITAL_COMMUNITY): Payer: Self-pay

## 2023-02-04 ENCOUNTER — Other Ambulatory Visit: Payer: Self-pay

## 2023-02-04 ENCOUNTER — Encounter (HOSPITAL_COMMUNITY)
Admission: RE | Disposition: A | Discharge status: Home / Self Care | Payer: Self-pay | Source: Home / Self Care | Attending: Surgery

## 2023-02-04 ENCOUNTER — Inpatient Hospital Stay (HOSPITAL_COMMUNITY): Payer: BC Managed Care – PPO | Admitting: Physician Assistant

## 2023-02-04 ENCOUNTER — Inpatient Hospital Stay (HOSPITAL_COMMUNITY): Payer: Self-pay | Admitting: Anesthesiology

## 2023-02-04 ENCOUNTER — Inpatient Hospital Stay (HOSPITAL_COMMUNITY)
Admission: RE | Admit: 2023-02-04 | Discharge: 2023-02-05 | DRG: 621 | Disposition: A | Discharge status: Home / Self Care | Payer: BC Managed Care – PPO | Attending: Surgery | Admitting: Surgery

## 2023-02-04 ENCOUNTER — Encounter (HOSPITAL_COMMUNITY): Payer: Self-pay | Admitting: Surgery

## 2023-02-04 DIAGNOSIS — Z6841 Body Mass Index (BMI) 40.0 and over, adult: Secondary | ICD-10-CM

## 2023-02-04 DIAGNOSIS — M1990 Unspecified osteoarthritis, unspecified site: Secondary | ICD-10-CM | POA: Diagnosis present

## 2023-02-04 DIAGNOSIS — Z7985 Long-term (current) use of injectable non-insulin antidiabetic drugs: Secondary | ICD-10-CM | POA: Diagnosis not present

## 2023-02-04 DIAGNOSIS — Z8249 Family history of ischemic heart disease and other diseases of the circulatory system: Secondary | ICD-10-CM

## 2023-02-04 DIAGNOSIS — Z87891 Personal history of nicotine dependence: Secondary | ICD-10-CM | POA: Diagnosis not present

## 2023-02-04 DIAGNOSIS — I10 Essential (primary) hypertension: Secondary | ICD-10-CM | POA: Diagnosis present

## 2023-02-04 DIAGNOSIS — Z90710 Acquired absence of both cervix and uterus: Secondary | ICD-10-CM

## 2023-02-04 DIAGNOSIS — E890 Postprocedural hypothyroidism: Secondary | ICD-10-CM | POA: Diagnosis present

## 2023-02-04 DIAGNOSIS — Z79899 Other long term (current) drug therapy: Secondary | ICD-10-CM

## 2023-02-04 DIAGNOSIS — E119 Type 2 diabetes mellitus without complications: Secondary | ICD-10-CM | POA: Diagnosis present

## 2023-02-04 DIAGNOSIS — Z01818 Encounter for other preprocedural examination: Secondary | ICD-10-CM

## 2023-02-04 DIAGNOSIS — Z96611 Presence of right artificial shoulder joint: Secondary | ICD-10-CM | POA: Diagnosis present

## 2023-02-04 HISTORY — PX: UPPER GI ENDOSCOPY: SHX6162

## 2023-02-04 LAB — CREATININE, SERUM
Creatinine, Ser: 0.76 mg/dL (ref 0.44–1.00)
GFR, Estimated: >60 mL/min (ref >60)

## 2023-02-04 LAB — CBC
HCT: 39.9 % (ref 36.0–46.0)
Hemoglobin: 12 g/dL (ref 12.0–15.0)
MCH: 27 pg (ref 26.0–34.0)
MCHC: 30.1 g/dL (ref 30.0–36.0)
MCV: 89.9 fL (ref 80.0–100.0)
Platelets: 162 10*3/uL (ref 150–400)
RBC: 4.44 MIL/uL (ref 3.87–5.11)
RDW: 14.7 % (ref 11.5–15.5)
WBC: 8.2 10*3/uL (ref 4.0–10.5)
nRBC: 0 % (ref 0.0–0.2)

## 2023-02-04 LAB — ABO/RH: ABO/RH(D): O POS

## 2023-02-04 SURGERY — XI ROBOTIC GASTRIC SLEEVE RESECTION
Anesthesia: General

## 2023-02-04 MED ORDER — MIDAZOLAM HCL 5 MG/5ML IJ SOLN
INTRAMUSCULAR | Status: DC | PRN
  Administered 2023-02-04: 2 mg via INTRAVENOUS

## 2023-02-04 MED ORDER — BUPIVACAINE-EPINEPHRINE 0.25% -1:200000 IJ SOLN
INTRAMUSCULAR | Status: DC | PRN
  Administered 2023-02-04: 30 mL

## 2023-02-04 MED ORDER — ACETAMINOPHEN 500 MG PO TABS
1000.0000 mg | ORAL_TABLET | ORAL | Status: DC
  Filled 2023-02-04: qty 2

## 2023-02-04 MED ORDER — PANTOPRAZOLE SODIUM 40 MG IV SOLR
40.0000 mg | Freq: Every day | INTRAVENOUS | Status: DC
  Administered 2023-02-04: 40 mg via INTRAVENOUS
  Filled 2023-02-04: qty 10

## 2023-02-04 MED ORDER — DROPERIDOL 2.5 MG/ML IJ SOLN
INTRAMUSCULAR | Status: AC
  Administered 2023-02-04: 0.625 mg via INTRAVENOUS
  Filled 2023-02-04: qty 2

## 2023-02-04 MED ORDER — ACETAMINOPHEN 500 MG PO TABS
1000.0000 mg | ORAL_TABLET | Freq: Three times a day (TID) | ORAL | Status: DC
  Administered 2023-02-04 – 2023-02-05 (×2): 1000 mg via ORAL
  Filled 2023-02-04 (×3): qty 2

## 2023-02-04 MED ORDER — OXYCODONE HCL 5 MG PO TABS: 5.0000 mg | ORAL_TABLET | Freq: Once | ORAL | Status: DC | PRN

## 2023-02-04 MED ORDER — ACETAMINOPHEN 160 MG/5ML PO SOLN: 1000.0000 mg | Freq: Three times a day (TID) | ORAL | Status: DC

## 2023-02-04 MED ORDER — MIDAZOLAM HCL 2 MG/2ML IJ SOLN
INTRAMUSCULAR | Status: AC
  Filled 2023-02-04: qty 2

## 2023-02-04 MED ORDER — BUPIVACAINE LIPOSOME 1.3 % IJ SUSP: 20.0000 mL | Freq: Once | INTRAMUSCULAR | Status: DC

## 2023-02-04 MED ORDER — OXYCODONE HCL 5 MG/5ML PO SOLN
5.0000 mg | Freq: Four times a day (QID) | ORAL | Status: DC | PRN
  Administered 2023-02-04 – 2023-02-05 (×2): 5 mg via ORAL
  Filled 2023-02-04 (×2): qty 5

## 2023-02-04 MED ORDER — CHLORHEXIDINE GLUCONATE CLOTH 2 % EX PADS: 6.0000 | MEDICATED_PAD | Freq: Once | CUTANEOUS | Status: DC

## 2023-02-04 MED ORDER — PROPOFOL 10 MG/ML IV BOLUS
INTRAVENOUS | Status: AC
  Filled 2023-02-04: qty 20

## 2023-02-04 MED ORDER — DEXAMETHASONE SODIUM PHOSPHATE 10 MG/ML IJ SOLN
INTRAMUSCULAR | Status: DC | PRN
  Administered 2023-02-04: 8 mg via INTRAVENOUS

## 2023-02-04 MED ORDER — BUPIVACAINE-EPINEPHRINE 0.25% -1:200000 IJ SOLN
INTRAMUSCULAR | Status: AC
  Filled 2023-02-04: qty 1

## 2023-02-04 MED ORDER — LACTATED RINGERS IV SOLN: INTRAVENOUS | Status: DC

## 2023-02-04 MED ORDER — CELECOXIB 200 MG PO CAPS
200.0000 mg | ORAL_CAPSULE | Freq: Once | ORAL | Status: AC
  Administered 2023-02-04: 200 mg via ORAL
  Filled 2023-02-04: qty 1

## 2023-02-04 MED ORDER — DEXAMETHASONE SODIUM PHOSPHATE 10 MG/ML IJ SOLN
INTRAMUSCULAR | Status: AC
  Filled 2023-02-04: qty 1

## 2023-02-04 MED ORDER — APREPITANT 40 MG PO CAPS
40.0000 mg | ORAL_CAPSULE | ORAL | Status: AC
  Administered 2023-02-04: 40 mg via ORAL
  Filled 2023-02-04: qty 1

## 2023-02-04 MED ORDER — LIDOCAINE 2% (20 MG/ML) 5 ML SYRINGE
INTRAMUSCULAR | Status: DC | PRN
  Administered 2023-02-04: 60 mg via INTRAVENOUS
  Administered 2023-02-04: 1.5 mg/kg/h via INTRAVENOUS

## 2023-02-04 MED ORDER — ROCURONIUM BROMIDE 10 MG/ML (PF) SYRINGE
PREFILLED_SYRINGE | INTRAVENOUS | Status: AC
  Filled 2023-02-04: qty 10

## 2023-02-04 MED ORDER — PROPOFOL 10 MG/ML IV BOLUS
INTRAVENOUS | Status: DC | PRN
  Administered 2023-02-04: 200 mg via INTRAVENOUS

## 2023-02-04 MED ORDER — LISINOPRIL 10 MG PO TABS
10.0000 mg | ORAL_TABLET | Freq: Every evening | ORAL | Status: DC
  Administered 2023-02-04: 10 mg via ORAL
  Filled 2023-02-04: qty 1

## 2023-02-04 MED ORDER — SUCCINYLCHOLINE CHLORIDE 200 MG/10ML IV SOSY
PREFILLED_SYRINGE | INTRAVENOUS | Status: AC
  Filled 2023-02-04: qty 10

## 2023-02-04 MED ORDER — FENTANYL CITRATE PF 50 MCG/ML IJ SOSY
PREFILLED_SYRINGE | INTRAMUSCULAR | Status: AC
  Administered 2023-02-04: 50 ug via INTRAVENOUS
  Filled 2023-02-04: qty 2

## 2023-02-04 MED ORDER — GABAPENTIN 100 MG PO CAPS
100.0000 mg | ORAL_CAPSULE | Freq: Two times a day (BID) | ORAL | Status: DC
  Administered 2023-02-04 – 2023-02-05 (×2): 100 mg via ORAL
  Filled 2023-02-04 (×3): qty 1

## 2023-02-04 MED ORDER — SODIUM CHLORIDE 0.9 % IV SOLN
2.0000 g | INTRAVENOUS | Status: AC
  Administered 2023-02-04: 2 g via INTRAVENOUS
  Filled 2023-02-04: qty 2

## 2023-02-04 MED ORDER — CYCLOBENZAPRINE HCL 10 MG PO TABS: 10.0000 mg | ORAL_TABLET | Freq: Three times a day (TID) | ORAL | Status: DC | PRN

## 2023-02-04 MED ORDER — BUPIVACAINE LIPOSOME 1.3 % IJ SUSP
INTRAMUSCULAR | Status: DC | PRN
  Administered 2023-02-04: 20 mL

## 2023-02-04 MED ORDER — DROPERIDOL 2.5 MG/ML IJ SOLN: 0.6250 mg | Freq: Once | INTRAMUSCULAR | Status: AC

## 2023-02-04 MED ORDER — ORAL CARE MOUTH RINSE: 15.0000 mL | Freq: Once | OROMUCOSAL | Status: AC

## 2023-02-04 MED ORDER — LIDOCAINE HCL (PF) 2 % IJ SOLN
INTRAMUSCULAR | Status: AC
  Filled 2023-02-04: qty 5

## 2023-02-04 MED ORDER — ONDANSETRON HCL 4 MG/2ML IJ SOLN
INTRAMUSCULAR | Status: DC | PRN
  Administered 2023-02-04: 4 mg via INTRAVENOUS

## 2023-02-04 MED ORDER — FENTANYL CITRATE PF 50 MCG/ML IJ SOSY
PREFILLED_SYRINGE | INTRAMUSCULAR | Status: AC
  Administered 2023-02-04: 50 ug via INTRAVENOUS
  Filled 2023-02-04: qty 1

## 2023-02-04 MED ORDER — SIMETHICONE 80 MG PO CHEW
80.0000 mg | CHEWABLE_TABLET | Freq: Four times a day (QID) | ORAL | Status: DC | PRN
  Administered 2023-02-04: 80 mg via ORAL
  Filled 2023-02-04: qty 1

## 2023-02-04 MED ORDER — STERILE WATER FOR IRRIGATION IR SOLN
Status: DC | PRN
  Administered 2023-02-04: 1000 mL

## 2023-02-04 MED ORDER — OXYCODONE HCL 5 MG/5ML PO SOLN: 5.0000 mg | Freq: Once | ORAL | Status: DC | PRN

## 2023-02-04 MED ORDER — FENTANYL CITRATE PF 50 MCG/ML IJ SOSY
25.0000 ug | PREFILLED_SYRINGE | INTRAMUSCULAR | Status: DC | PRN
  Administered 2023-02-04: 50 ug via INTRAVENOUS

## 2023-02-04 MED ORDER — ACETAMINOPHEN 500 MG PO TABS
1000.0000 mg | ORAL_TABLET | Freq: Once | ORAL | Status: AC
  Administered 2023-02-04: 1000 mg via ORAL

## 2023-02-04 MED ORDER — HEPARIN SODIUM (PORCINE) 5000 UNIT/ML IJ SOLN
5000.0000 [IU] | INTRAMUSCULAR | Status: AC
  Administered 2023-02-04: 5000 [IU] via SUBCUTANEOUS
  Filled 2023-02-04: qty 1

## 2023-02-04 MED ORDER — ROCURONIUM BROMIDE 10 MG/ML (PF) SYRINGE
PREFILLED_SYRINGE | INTRAVENOUS | Status: DC | PRN
  Administered 2023-02-04: 50 mg via INTRAVENOUS

## 2023-02-04 MED ORDER — SUCCINYLCHOLINE CHLORIDE 200 MG/10ML IV SOSY
PREFILLED_SYRINGE | INTRAVENOUS | Status: DC | PRN
  Administered 2023-02-04: 140 mg via INTRAVENOUS

## 2023-02-04 MED ORDER — PHENYLEPHRINE HCL-NACL 20-0.9 MG/250ML-% IV SOLN
INTRAVENOUS | Status: DC | PRN
  Administered 2023-02-04: 25 ug/min via INTRAVENOUS

## 2023-02-04 MED ORDER — ONDANSETRON HCL 4 MG/2ML IJ SOLN
4.0000 mg | INTRAMUSCULAR | Status: DC | PRN
  Administered 2023-02-04 – 2023-02-05 (×2): 4 mg via INTRAVENOUS
  Filled 2023-02-04 (×2): qty 2

## 2023-02-04 MED ORDER — CHLORHEXIDINE GLUCONATE 0.12 % MT SOLN
15.0000 mL | Freq: Once | OROMUCOSAL | Status: AC
  Administered 2023-02-04: 15 mL via OROMUCOSAL

## 2023-02-04 MED ORDER — LIDOCAINE HCL 2 % IJ SOLN
INTRAMUSCULAR | Status: AC
  Filled 2023-02-04: qty 20

## 2023-02-04 MED ORDER — ENSURE MAX PROTEIN PO LIQD
2.0000 [oz_av] | ORAL | Status: DC
  Administered 2023-02-05 (×2): 2 [oz_av] via ORAL

## 2023-02-04 MED ORDER — 0.9 % SODIUM CHLORIDE (POUR BTL) OPTIME
TOPICAL | Status: DC | PRN
  Administered 2023-02-04: 1000 mL

## 2023-02-04 MED ORDER — MORPHINE SULFATE (PF) 2 MG/ML IV SOLN: 1.0000 mg | INTRAVENOUS | Status: DC | PRN

## 2023-02-04 MED ORDER — BUPIVACAINE LIPOSOME 1.3 % IJ SUSP
INTRAMUSCULAR | Status: AC
  Filled 2023-02-04: qty 20

## 2023-02-04 MED ORDER — FENTANYL CITRATE (PF) 100 MCG/2ML IJ SOLN
INTRAMUSCULAR | Status: AC
  Filled 2023-02-04: qty 2

## 2023-02-04 MED ORDER — FENTANYL CITRATE (PF) 100 MCG/2ML IJ SOLN
INTRAMUSCULAR | Status: DC | PRN
  Administered 2023-02-04: 100 ug via INTRAVENOUS

## 2023-02-04 MED ORDER — SUGAMMADEX SODIUM 200 MG/2ML IV SOLN
INTRAVENOUS | Status: DC | PRN
  Administered 2023-02-04: 200 mg via INTRAVENOUS

## 2023-02-04 MED ORDER — HEPARIN SODIUM (PORCINE) 5000 UNIT/ML IJ SOLN
5000.0000 [IU] | Freq: Three times a day (TID) | INTRAMUSCULAR | Status: DC
  Administered 2023-02-04 – 2023-02-05 (×2): 5000 [IU] via SUBCUTANEOUS
  Filled 2023-02-04 (×2): qty 1

## 2023-02-04 SURGICAL SUPPLY — 71 items
ADH SKN CLS APL DERMABOND .7 (GAUZE/BANDAGES/DRESSINGS) ×1
ANTIFOG SOL W/FOAM PAD STRL (MISCELLANEOUS) ×1
APL PRP STRL LF DISP 70% ISPRP (MISCELLANEOUS) ×2
APPLIER CLIP 5 13 M/L LIGAMAX5 (MISCELLANEOUS)
APPLIER CLIP ROT 10 11.4 M/L (STAPLE)
APR CLP MED LRG 11.4X10 (STAPLE)
APR CLP MED LRG 5 ANG JAW (MISCELLANEOUS)
BAG COUNTER SPONGE SURGICOUNT (BAG) ×1 IMPLANT
BAG SPNG CNTER NS LX DISP (BAG) ×1
BLADE SURG SZ11 CARB STEEL (BLADE) ×1 IMPLANT
CANNULA REDUCER 12-8 DVNC XI (CANNULA) ×1 IMPLANT
CHLORAPREP W/TINT 26 (MISCELLANEOUS) ×2 IMPLANT
CLIP APPLIE 5 13 M/L LIGAMAX5 (MISCELLANEOUS) IMPLANT
CLIP APPLIE ROT 10 11.4 M/L (STAPLE) IMPLANT
COVER SURGICAL LIGHT HANDLE (MISCELLANEOUS) ×1 IMPLANT
COVER TIP SHEARS 8 DVNC (MISCELLANEOUS) IMPLANT
DERMABOND ADVANCED .7 DNX12 (GAUZE/BANDAGES/DRESSINGS) ×1 IMPLANT
DRAPE ARM DVNC X/XI (DISPOSABLE) ×4 IMPLANT
DRAPE COLUMN DVNC XI (DISPOSABLE) ×1 IMPLANT
DRIVER NDL LRG 8 DVNC XI (INSTRUMENTS) ×1 IMPLANT
DRIVER NDL MEGA SUTCUT DVNCXI (INSTRUMENTS) ×1 IMPLANT
DRIVER NDLE LRG 8 DVNC XI (INSTRUMENTS) ×1
DRIVER NDLE MEGA SUTCUT DVNCXI (INSTRUMENTS) ×1
ELECT REM PT RETURN 15FT ADLT (MISCELLANEOUS) ×1 IMPLANT
GAUZE 4X4 16PLY ~~LOC~~+RFID DBL (SPONGE) ×1 IMPLANT
GLOVE BIO SURGEON STRL SZ7.5 (GLOVE) ×2 IMPLANT
GLOVE INDICATOR 8.0 STRL GRN (GLOVE) ×2 IMPLANT
GOWN STRL REUS W/ TWL XL LVL3 (GOWN DISPOSABLE) ×2 IMPLANT
GOWN STRL REUS W/TWL XL LVL3 (GOWN DISPOSABLE) ×2
GRASPER SUT TROCAR 14GX15 (MISCELLANEOUS) ×1 IMPLANT
GRASPER TIP-UP FEN DVNC XI (INSTRUMENTS) ×1 IMPLANT
HEMOSTAT SNOW SURGICEL 2X4 (HEMOSTASIS) IMPLANT
IRRIG SUCT STRYKERFLOW 2 WTIP (MISCELLANEOUS) ×1
IRRIGATION SUCT STRKRFLW 2 WTP (MISCELLANEOUS) ×1 IMPLANT
IRRIGATOR SUCT 8 DISP DVNC XI (IRRIGATION / IRRIGATOR) IMPLANT
KIT BASIN OR (CUSTOM PROCEDURE TRAY) ×1 IMPLANT
KIT TURNOVER KIT A (KITS) IMPLANT
LUBRICANT JELLY K Y 4OZ (MISCELLANEOUS) IMPLANT
MARKER SKIN DUAL TIP RULER LAB (MISCELLANEOUS) IMPLANT
MAT PREVALON FULL STRYKER (MISCELLANEOUS) ×1 IMPLANT
NDL SPNL 18GX3.5 QUINCKE PK (NEEDLE) ×1 IMPLANT
NEEDLE SPNL 18GX3.5 QUINCKE PK (NEEDLE) ×1
OBTURATOR OPTICAL STND 8 DVNC (TROCAR) ×1
OBTURATOR OPTICALSTD 8 DVNC (TROCAR) ×1 IMPLANT
PACK CARDIOVASCULAR III (CUSTOM PROCEDURE TRAY) ×1 IMPLANT
RELOAD STAPLE 60 2.5 WHT DVNC (STAPLE) IMPLANT
RELOAD STAPLE 60 3.5 BLU DVNC (STAPLE) IMPLANT
SCISSORS LAP 5X35 DISP (ENDOMECHANICALS) IMPLANT
SCISSORS MNPLR CVD DVNC XI (INSTRUMENTS) ×1 IMPLANT
SEAL UNIV 5-12 XI (MISCELLANEOUS) ×4 IMPLANT
SEALER VESSEL EXT DVNC XI (MISCELLANEOUS) ×1 IMPLANT
SET TUBE SMOKE EVAC HIGH FLOW (TUBING) ×1 IMPLANT
SLEEVE GASTRECTOMY 40FR VISIGI (MISCELLANEOUS) IMPLANT
SOL ELECTROSURG ANTI STICK (MISCELLANEOUS) ×1
SOLUTION ANTFG W/FOAM PAD STRL (MISCELLANEOUS) ×1 IMPLANT
SOLUTION ELECTROSURG ANTI STCK (MISCELLANEOUS) ×1 IMPLANT
SPIKE FLUID TRANSFER (MISCELLANEOUS) ×1 IMPLANT
STAPLER 60 SUREFORM DVNC (STAPLE) ×1 IMPLANT
STAPLER RELOAD 2.5X60 WHT DVNC (STAPLE) ×5
STAPLER RELOAD 3.5X60 BLU DVNC (STAPLE) ×1
SUT MNCRL AB 4-0 PS2 18 (SUTURE) ×2 IMPLANT
SUT VIC AB 0 CT1 27 (SUTURE) ×1
SUT VIC AB 0 CT1 27XBRD ANTBC (SUTURE) ×1 IMPLANT
SUT VIC AB 2-0 SH 27 (SUTURE) ×1
SUT VIC AB 2-0 SH 27XBRD (SUTURE) ×1 IMPLANT
SYR 20ML LL LF (SYRINGE) ×1 IMPLANT
TOWEL OR 17X26 10 PK STRL BLUE (TOWEL DISPOSABLE) ×1 IMPLANT
TRAY FOLEY MTR SLVR 16FR STAT (SET/KITS/TRAYS/PACK) IMPLANT
TROCAR ADV FIXATION 12X100MM (TROCAR) IMPLANT
TROCAR Z-THREAD FIOS 5X100MM (TROCAR) ×1 IMPLANT
TUBE CALIBRATION LAPBAND (TUBING) IMPLANT

## 2023-02-04 NOTE — Anesthesia Procedure Notes (Signed)
Procedure Name: Intubation Date/Time: 02/04/2023 7:33 AM  Performed by: Elisabeth Cara, CRNAPre-anesthesia Checklist: Patient identified, Emergency Drugs available, Patient being monitored, Suction available and Timeout performed Patient Re-evaluated:Patient Re-evaluated prior to induction Oxygen Delivery Method: Circle system utilized Preoxygenation: Pre-oxygenation with 100% oxygen Induction Type: IV induction, Rapid sequence and Cricoid Pressure applied Laryngoscope Size: Mac and 4 Grade View: Grade I Tube type: Oral Tube size: 7.5 mm Number of attempts: 1 Airway Equipment and Method: Stylet Placement Confirmation: ETT inserted through vocal cords under direct vision, positive ETCO2 and breath sounds checked- equal and bilateral Secured at: 21 cm Tube secured with: Tape Dental Injury: Teeth and Oropharynx as per pre-operative assessment

## 2023-02-04 NOTE — Op Note (Signed)
Patient: Olivia Schmidt (07/06/1960, [id number])  Date of Surgery: 02/04/23  Preoperative Diagnosis: MORBID OBESITY   Postoperative Diagnosis: MORBID OBESITY   Surgical Procedure: ROBOTIC SLEEVE GASTRECTOMY: 46962 (CPT) UPPER GI ENDOSCOPY: [id number]   Operative Team Members:  Surgeons and Role:    * Olivia Schmidt, Olivia Hopes, MD - Primary    * Olivia, Hedda Schmidt, [place] - Assisting   Anesthesiologist: Olivia Median, DO CRNA: Olivia Cara, CRNA; Olivia Sarin, CRNA   Anesthesia: General   Fluids:  Total I/O In: 100 [IV Piggyback:100] Out: -   Complications: None  Drains:  none   Specimen:  ID Type Source Tests Collected by Time Destination  1 : greater curvature of stomach Tissue PATH GI benign resection SURGICAL PATHOLOGY Olivia Schmidt, Olivia Hopes, MD 02/04/2023 0818      Disposition:  PACU - hemodynamically stable.  Plan of Care: Admit to inpatient     Indications for Procedure: Olivia Schmidt is a 62 y.o. female who presented with obesity, BMI 42 for robotic sleeve gastrectomy with upper endoscopy.  We discussed the procedure, its risks, benefits and alternatives on multiple occasions and the patient granted consent to proceed.  Findings: Normal anatomy  Infection status: Patient: Private Patient Elective Case Case: Elective Infection Present At Time Of Surgery (PATOS): None   Description of Procedure:   On the date stated above, the patient was taken to the operating room suite and placed in supine positioning.  General endotracheal anesthesia was induced.  A timeout was completed verifying the correct patient, procedure, positioning and equipment needed for the case.  The patient's abdomen was prepped and draped in the usual sterile fashion.  I entered the patient's right upper quadrant using a 5 mm trocar in the optical technique.  There was no trauma to underlying viscera with initial trocar placement.  The abdomen was insufflated 15 mmHg.   A total of 4 robotic trochars were placed across the mid abdomen, including the 5 mm initial trocar being upsized to a 8 mm trocar.  The robotic stapler trocar was placed in the number two position.  The [facility] liver retractor was placed through the subxiphoid region and under the left lobe of the liver and was connected to the rail of the bed.  A TAP block was placed using marcaine and Exparel under direct vision of the laparoscope.  The [employer] XI robotic platform was docked and we transitioned to robotic surgery.   Using the tip up fenestrated grasper, fenestrated bipolar, 30 degree camera and Vessel Sealer from the patient's right to left, we began by dissecting the angle of His off the left crus of the diaphragm.  The adhesions between the stomach, spleen and diaphragm were divided using the Vessel Sealer to define the angle of His.    I then started 6 cm away from the pylorus along the greater curve the stomach and divided the gastroepiploic vessels and the gastrocolic ligament.  The lesser sac was entered.  There were really no adhesions to the posterior wall of the stomach.  The greater curve was mobilized working superiorly toward the spleen.  All of the gastroepiploic and short gastric vessels were divided as we divided the gastrocolic and gastrosplenic ligaments.  As we reached the splenic hilum, I lifted the stomach anteriorly.  I created a tunnel between the stomach and its attachments to the retroperitoneum posteriorly just to the left of the GE junction until I encountered the left crus and my previous angle  of His dissection.  We then were able to approach the shortest of the short gastrics both from the greater curve the stomach laterally and from the left crus medially.  These were divided using the Vessel Sealer and the fundus of the stomach was fully mobilized.  With the stomach fully mobilized we direct our attention to stapling.  A 40 French VISI G was inserted into the stomach and  positioned along the lesser curve the stomach and suction was applied.  The 60 mm robotic sureform linear stapler was used to create the sleeve gastrectomy.  We started 6 cm from the pylorus and were careful to avoid narrowing at the incisura.  We stayed about 1 cm away from the GE junction to protect the sling fibers.  We used one blue and multiple white loads of the linear stapler.  With the sleeve gastrectomy completed the VISI G was taken off suction and removed and we performed an upper endoscopy.  The intra-abdominal pressure was decreased to to check hemostasis.  The foregut was submerged in saline irrigation and the adult upper endoscope was inserted into the stomach as far as the pylorus to inspect the sleeve.  The sleeve appeared appropriately oriented without any twisting.  There was good hemostasis.  The sleeve was widely patent at the incisura with no narrowing.  There was no significant retained fundus.  The sleeve was inflated with the endoscope and there was no bubbling of the irrigation, suggesting a negative leak test and a airtight sleeve gastrectomy.  The foregut was decompressed with the endoscope and the endoscope was removed.  An omentopexy was performed, using running 2-0 vicryl suture to connect the inferior two staple lines to the divided omentum. There was good hemostasis at the end of the case.  The robot was undocked and moved away from the field.  The sleeve gastrectomy specimen was removed from the stapler port.  The fascia of the stapler port was closed using a 0 Vicryl on a PMI suture passer.   The liver retractor was removed under direct vision.  The pneumoperitoneum was evacuated.  The skin was closed using 4-0 Monocryl and Dermabond.  All sponge and needle counts were correct at the end of the case.  Ivar Drape, MD General, Bariatric, & Minimally Invasive Surgery [facility] Surgery, [place]

## 2023-02-04 NOTE — Progress Notes (Signed)
Rounded on pt. Pt rather drowsy but able to carry conversation. Pt stated once she get's the gas pain out she will feel a lot better, encouraged pt to stand and walk to help with gas discomfort. Pt receptive. Discussed QI "Goals for Discharge" document with patient including ambulation in halls, Incentive Spirometry use every hour, and oral care.  Also discussed pain and nausea control.  Enabled or verified head of bed 30 degree alarm activated.  BSTOP education provided including BSTOP information guide, "Guide for Pain Management after your Bariatric Procedure".  Diet progression education provided including "Bariatric Surgery Post-Op Food Plan Phase 1: Liquids".  Questions answered.  Will continue to partner with bedside RN and follow up with patient per protocol.   Thank you,  Lubertha Basque, RN, MSN Bariatric Nurse Coordinator [phone] (office)

## 2023-02-04 NOTE — Transfer of Care (Signed)
Immediate Anesthesia Transfer of Care Note  Patient: Olivia Schmidt  Procedure(s) Performed: ROBOTIC SLEEVE GASTRECTOMY UPPER GI ENDOSCOPY  Patient Location: PACU  Anesthesia Type:General  Level of Consciousness: awake, alert , oriented, and patient cooperative  Airway & Oxygen Therapy: Patient Spontanous Breathing and Patient connected to face mask oxygen  Post-op Assessment: Report given to RN, Post -op Vital signs reviewed and stable, and Patient moving all extremities  Post vital signs: Reviewed and stable  Last Vitals:  Vitals Value Taken Time  BP 159/79 02/04/23 0905  Temp    Pulse 72 02/04/23 0907  Resp 22 02/04/23 0907  SpO2 100 % 02/04/23 0907  Vitals shown include unfiled device data.  Last Pain:  Vitals:   02/04/23 0615  TempSrc: Oral  PainSc:          Complications: No notable events documented.

## 2023-02-04 NOTE — Progress Notes (Signed)
PHARMACY CONSULT FOR:  Risk Assessment for Post-Discharge VTE Following Bariatric Surgery  Post-Discharge VTE Risk Assessment: This patient's probability of 30-day post-discharge VTE is increased due to the factors marked: x Sleeve gastrectomy   Liver disorder (transplant, cirrhosis, or nonalcoholic steatohepatitis)   Hx of VTE   Hemorrhage requiring transfusion   GI perforation, leak, or obstruction   ====================================================    Female    Age >/=60 years    BMI >/=50 kg/m2    CHF    Dyspnea at Rest    Paraplegia   x Non-gastric-band surgery    Operation Time >/=3 hr    Return to OR     Length of Stay >/= 3 d   Hypercoagulable condition   Significant venous stasis      Predicted probability of 30-day post-discharge VTE: 0.16% (Mild)   Recommendation for Discharge: No pharmacologic prophylaxis post-discharge  Olivia Schmidt is a 62 y.o. female who underwent  Robotic Sleeve Gastrectomy on 02/04/2023   Case start: 0753 Case end: 0853   No Known Allergies  Patient Measurements: Height: 5\' 7"  (170.2 cm) Weight: 122.5 kg (270 lb) IBW/kg (Calculated) : 61.6 Body mass index is 42.29 kg/m.  No results for input(s): "WBC", "HGB", "HCT", "PLT", "APTT", "CREATININE", "LABCREA", "CREAT24HRUR", "MG", "PHOS", "ALBUMIN", "PROT", "AST", "ALT", "ALKPHOS", "BILITOT", "BILIDIR", "IBILI" in the last 72 hours. Estimated Creatinine Clearance: 102.8 mL/min (by C-G formula based on SCr of 0.67 mg/dL).    Past Medical History:  Diagnosis Date   Acute bronchitis 05/08/2015   Anemia 05/07/2015   Arthritis    Atrial fibrillation with rapid ventricular response (HCC)    Chest pain 05/07/2015   FEMALE INFERTILITY 10/21/2008   Qualifier: Diagnosis of  By: Delrae Alfred MD, Elizabeth     Fibroids    FIBROIDS, UTERUS 07/11/2009   Qualifier: Diagnosis of  By: Delrae Alfred MD, Agapito Games    Hypertension    Hyperthyroidism 05/07/2015   Hypokalemia  05/08/2015   Hypomagnesemia 05/08/2015   PAF (paroxysmal atrial fibrillation) (HCC) 05/09/2015   Thrombocytopenia (HCC) 05/09/2015     Medications Prior to Admission  Medication Sig Dispense Refill Last Dose   cyclobenzaprine (FLEXERIL) 10 MG tablet Take 1 tablet (10 mg total) by mouth 3 (three) times daily as needed for muscle spasms 90 tablet 0 02/02/2023   ergocalciferol (VITAMIN D2) 1.25 MG (50000 UT) capsule Take 1 capsule (50,000 Units total) by mouth once a week. (Patient taking differently: Take 50,000 Units by mouth every Saturday.) 12 capsule 1 01/28/2023   lisinopril (ZESTRIL) 10 MG tablet Take 1 tablet (10 mg total) by mouth every evening. 90 tablet 1 Past Week   naloxone (NARCAN) nasal spray 4 mg/0.1 mL Spray 1 spray as needed as directed. 1 each 3 never   naproxen sodium (ALEVE) 220 MG tablet Take 440 mg by mouth 2 (two) times daily as needed (pain.).   Past Month   oxyCODONE-acetaminophen (PERCOCET) 10-325 MG tablet Take 1 tablet by mouth 5 (five) times daily as needed for pain. 150 tablet 0 Past Week   oxyCODONE-acetaminophen (PERCOCET) 10-325 MG tablet Take 1 tablet by mouth 5 (five) times daily as needed for pain 150 tablet 0 Past Week   cyclobenzaprine (FLEXERIL) 10 MG tablet Take 1 tablet (10 mg total) by mouth 3 (three) times daily as needed for muscle spasms. 90 tablet 0 02/02/2023   Naproxen Sodium 375 MG TB24 Take 1 tablet (375 mg total) by mouth daily. (Patient taking differently: Take 375 mg  by mouth daily as needed (pain.).) 90 tablet 1 More than a month   Semaglutide, 1 MG/DOSE, (OZEMPIC, 1 MG/DOSE,) 4 MG/3ML SOPN Inject 1 mg into the skin once a week. (Patient not taking: Reported on 12/19/2022) 9 mL 1 never     Olivia Schmidt 02/04/2023,11:50 AM

## 2023-02-04 NOTE — Anesthesia Postprocedure Evaluation (Signed)
Anesthesia Post Note  Patient: Olivia Schmidt  Procedure(s) Performed: ROBOTIC SLEEVE GASTRECTOMY UPPER GI ENDOSCOPY     Patient location during evaluation: PACU Anesthesia Type: General Level of consciousness: awake and alert Pain management: pain level controlled Vital Signs Assessment: post-procedure vital signs reviewed and stable Respiratory status: spontaneous breathing, nonlabored ventilation, respiratory function stable and patient connected to nasal cannula oxygen Cardiovascular status: blood pressure returned to baseline and stable Postop Assessment: no apparent nausea or vomiting Anesthetic complications: no   No notable events documented.  Last Vitals:  Vitals:   02/04/23 1248 02/04/23 1453  BP: (!) 179/79 (!) 178/95  Pulse: 73 78  Resp: 17 17  Temp:  (!) 36.4 C  SpO2: 96% 99%    Last Pain:  Vitals:   02/04/23 1055  TempSrc: Oral  PainSc: 5                  Bodhi Moradi P Zaccheaus Storlie

## 2023-02-04 NOTE — H&P (Signed)
Admitting Physician: [name]  Service: Bariatric surgery  CC: Obesity  Subjective   HPI: Olivia Schmidt is an 62 y.o. female who is here for sleeve gastrectomy  Past Medical History:  Diagnosis Date   Acute bronchitis 05/08/2015   Anemia 05/07/2015   Arthritis    Atrial fibrillation with rapid ventricular response (HCC)    Chest pain 05/07/2015   FEMALE INFERTILITY 10/21/2008   Qualifier: Diagnosis of  By: Delrae Alfred MD, Elizabeth     Fibroids    FIBROIDS, UTERUS 07/11/2009   Qualifier: Diagnosis of  By: Delrae Alfred MD, Agapito Games    Hypertension    Hyperthyroidism 05/07/2015   Hypokalemia 05/08/2015   Hypomagnesemia 05/08/2015   PAF (paroxysmal atrial fibrillation) (HCC) 05/09/2015   Thrombocytopenia (HCC) 05/09/2015    Past Surgical History:  Procedure Laterality Date   ABDOMINAL HYSTERECTOMY     BIOPSY  12/21/2022   Procedure: BIOPSY;  Surgeon: Quentin Ore, MD;  Location: [facility];  Service: General;;   ESOPHAGOGASTRODUODENOSCOPY N/A 12/21/2022   Procedure: ESOPHAGOGASTRODUODENOSCOPY (EGD);  Surgeon: Quentin Ore, MD;  Location: [facility];  Service: General;  Laterality: N/A;   I & D EXTREMITY Right 10/27/2015   Procedure: IRRIGATION AND DEBRIDEMENT RIGHT SMALL FINGER;  Surgeon: Betha Loa, MD;  Location: [facility];  Service: Orthopedics;  Laterality: Right;  With local block   THYROIDECTOMY     TOTAL SHOULDER ARTHROPLASTY Right 10/06/2021   Procedure: TOTAL SHOULDER ARTHROPLASTY;  Surgeon: Beverely Low, MD;  Location: [facility];  Service: Orthopedics;  Laterality: Right;  with ISB    Family History  Problem Relation Age of Onset   Aneurysm Father        brain   Hypertension Sister    Hypertension Brother    Thyroid disease Neg Hx    Colon cancer Neg Hx    Esophageal cancer Neg Hx    Rectal cancer Neg Hx    Stomach cancer Neg Hx     Social:  reports that she quit smoking about 19 years ago. Her smoking use  included cigarettes. She started smoking about 43 years ago. She has never used smokeless tobacco. She reports that she does not drink alcohol and does not use drugs.  Allergies: No Known Allergies  Medications: Current Outpatient Medications  Medication Instructions   cyclobenzaprine (FLEXERIL) 10 MG tablet Take 1 tablet (10 mg total) by mouth 3 (three) times daily as needed for muscle spasms   cyclobenzaprine (FLEXERIL) 10 MG tablet Take 1 tablet (10 mg total) by mouth 3 (three) times daily as needed for muscle spasms.   lisinopril (ZESTRIL) 10 mg, Oral, Every evening   naloxone (NARCAN) nasal spray 4 mg/0.1 mL Spray 1 spray as needed as directed.   naproxen sodium (ALEVE) 440 mg, Oral, 2 times daily PRN   Naproxen Sodium 375 mg, Oral, Daily   oxyCODONE-acetaminophen (PERCOCET) 10-325 MG tablet Take 1 tablet by mouth 5 (five) times daily as needed for pain.   oxyCODONE-acetaminophen (PERCOCET) 10-325 MG tablet Take 1 tablet by mouth 5 (five) times daily as needed for pain   Ozempic (1 MG/DOSE) 1 mg, Subcutaneous, Weekly   Vitamin D (Ergocalciferol) (DRISDOL) 50,000 Units, Oral, Weekly    ROS - all of the below systems have been reviewed with the patient and positives are indicated with bold text General: chills, fever or night sweats Eyes: blurry vision or double vision ENT: epistaxis or sore throat Allergy/Immunology: itchy/watery eyes or nasal congestion Hematologic/Lymphatic:  bleeding problems, blood clots or swollen lymph nodes Endocrine: temperature intolerance or unexpected weight changes Breast: new or changing breast lumps or nipple discharge Resp: cough, shortness of breath, or wheezing CV: chest pain or dyspnea on exertion GI: as per HPI GU: dysuria, trouble voiding, or hematuria MSK: joint pain or joint stiffness Neuro: TIA or stroke symptoms Derm: pruritus and skin lesion changes Psych: anxiety and depression  Objective   PE Blood pressure (!) 163/76, pulse 67,  temperature 98.1 F (36.7 C), temperature source Oral, resp. rate 16, height 5\' 7"  (1.702 m), weight 122.5 kg, last menstrual period 09/14/2010, SpO2 100%. Constitutional: NAD; conversant; no deformities Eyes: Moist conjunctiva; no lid lag; anicteric; PERRL Neck: Trachea midline; no thyromegaly Lungs: Normal respiratory effort; no tactile fremitus CV: RRR; no palpable thrills; no pitting edema GI: Abd Soft, nontender; no palpable hepatosplenomegaly MSK: Normal range of motion of extremities; no clubbing/cyanosis Psychiatric: Appropriate affect; alert and oriented x3 Lymphatic: No palpable cervical or axillary lymphadenopathy  Results for orders placed or performed during the hospital encounter of 02/04/23 (from the past 24 hour(s))  ABO/Rh     Status: None   Collection Time: 02/04/23  6:00 AM  Result Value Ref Range   ABO/RH(D)      O POS Performed at [facility], [address], [place], [place] [address]     Imaging Orders  No imaging studies ordered today     Assessment and Plan   Olivia Schmidt is a 62 y.o. female who is seen for bariatric surgery consultation. The patient has morbid obesity with a BMI of Body mass index is 42 kg/m. and the following conditions related to obesity: Arthritis, hypertension.  Today we discussed the surgical options to treat obesity and its associated comorbidity. After discussing the available procedures in the region, we discussed in great detail the surgeries I offer: robotic sleeve gastrectomy and robotic roux-en-y gastric bypass. We discussed the procedures themselves as well as their risks, benefits and alternatives. I entered the patient's basic information into the [facility] Metabolic Surgery Risk/Benefit Calculator to facilitate this discussion.   After a full discussion and all questions answered, the patient is interested in pursuing a robotic sleeve gastrectomy with upper endoscopy  She completed the bariatric surgery  preoperative pathway: - Bloodwork - Dietician consult - Chest x-ray - EKG - Psychology evaluation - Upper endoscopy with biopsy.   Today she presents for surgery.  We again discussed the procedure, its risks, benefits and alternatives.  After a full discussion and all questions answered the patient granted consent to proceed.  We will proceed as scheduled.    Quentin Ore, MD  [facility] Use [email or web address] to contact on call provider

## 2023-02-05 ENCOUNTER — Other Ambulatory Visit (HOSPITAL_COMMUNITY): Payer: Self-pay

## 2023-02-05 ENCOUNTER — Other Ambulatory Visit: Payer: Self-pay

## 2023-02-05 ENCOUNTER — Encounter (HOSPITAL_COMMUNITY): Payer: Self-pay | Admitting: Surgery

## 2023-02-05 ENCOUNTER — Encounter (HOSPITAL_COMMUNITY): Payer: Self-pay | Admitting: *Deleted

## 2023-02-05 LAB — CBC WITH DIFFERENTIAL/PLATELET
Abs Immature Granulocytes: 0.02 10*3/uL (ref 0.00–0.07)
Basophils Absolute: 0 10*3/uL (ref 0.0–0.1)
Basophils Relative: 0 %
Eosinophils Absolute: 0 10*3/uL (ref 0.0–0.5)
Eosinophils Relative: 0 %
HCT: 37.9 % (ref 36.0–46.0)
Hemoglobin: 11.8 g/dL — ABNORMAL LOW (ref 12.0–15.0)
Immature Granulocytes: 0 %
Lymphocytes Relative: 14 %
Lymphs Abs: 0.9 10*3/uL (ref 0.7–4.0)
MCH: 26.8 pg (ref 26.0–34.0)
MCHC: 31.1 g/dL (ref 30.0–36.0)
MCV: 86.1 fL (ref 80.0–100.0)
Monocytes Absolute: 0.4 10*3/uL (ref 0.1–1.0)
Monocytes Relative: 6 %
Neutro Abs: 5 10*3/uL (ref 1.7–7.7)
Neutrophils Relative %: 80 %
Platelets: 198 10*3/uL (ref 150–400)
RBC: 4.4 MIL/uL (ref 3.87–5.11)
RDW: 14.6 % (ref 11.5–15.5)
WBC: 6.3 10*3/uL (ref 4.0–10.5)
nRBC: 0 % (ref 0.0–0.2)

## 2023-02-05 MED ORDER — PANTOPRAZOLE SODIUM 40 MG PO TBEC
40.0000 mg | DELAYED_RELEASE_TABLET | Freq: Every day | ORAL | 0 refills | Status: AC
  Filled 2023-02-05: qty 90, 90d supply, fill #0

## 2023-02-05 MED ORDER — ONDANSETRON 4 MG PO TBDP
4.0000 mg | ORAL_TABLET | Freq: Four times a day (QID) | ORAL | 0 refills | Status: AC | PRN
  Filled 2023-02-05: qty 18, 21d supply, fill #0

## 2023-02-05 MED ORDER — GABAPENTIN 100 MG PO CAPS
100.0000 mg | ORAL_CAPSULE | Freq: Two times a day (BID) | ORAL | 0 refills | Status: AC
  Filled 2023-02-05: qty 10, 5d supply, fill #0

## 2023-02-05 MED ORDER — OXYCODONE HCL 5 MG PO TABS
5.0000 mg | ORAL_TABLET | Freq: Four times a day (QID) | ORAL | 0 refills | Status: AC | PRN
  Filled 2023-02-05: qty 10, 3d supply, fill #0

## 2023-02-05 MED ORDER — HYDRALAZINE HCL 20 MG/ML IJ SOLN
10.0000 mg | INTRAMUSCULAR | Status: DC | PRN
  Administered 2023-02-05: 10 mg via INTRAVENOUS
  Filled 2023-02-05: qty 1

## 2023-02-05 MED ORDER — ACETAMINOPHEN 500 MG PO TABS: 1000.0000 mg | ORAL_TABLET | Freq: Three times a day (TID) | ORAL | Status: AC

## 2023-02-05 NOTE — Plan of Care (Signed)
  Problem: Bowel/Gastric: Goal: Occurrences of nausea will decrease Outcome: Progressing   Problem: Elimination: Goal: Will not experience complications related to urinary retention Outcome: Adequate for Discharge

## 2023-02-05 NOTE — Discharge Summary (Signed)
Patient ID: Olivia Schmidt [id number] 62 y.o. 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   Graves disease 03/19/2017   Morbid obesity (HCC) 01/28/2017   Myalgia 02/14/2016   Goiter    PAF (paroxysmal atrial fibrillation) (HCC) 05/09/2015   Thrombocytopenia (HCC) 05/09/2015   Atrial fibrillation with rapid ventricular response (HCC)    Acute bronchitis 05/08/2015   Hypokalemia 05/08/2015   Hypomagnesemia 05/08/2015   Emesis    Atrial fibrillation with RVR (HCC) 05/07/2015   Chest pain 05/07/2015   Anemia 05/07/2015   FIBROIDS, UTERUS 07/11/2009   PELVIC PAIN, RIGHT 06/30/2009   FEMALE INFERTILITY 10/21/2008   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     Discharge Diagnoses:  Patient Active Problem List   Diagnosis Date Noted   Morbid obesity with BMI of 40.0-44.9, adult (HCC) 02/04/2023   S/P shoulder replacement, 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   Graves disease 03/19/2017   Morbid obesity (HCC) 01/28/2017   Myalgia 02/14/2016   Goiter    PAF (paroxysmal atrial fibrillation) (HCC) 05/09/2015   Thrombocytopenia (HCC) 05/09/2015   Atrial fibrillation with rapid ventricular response (HCC)    Acute bronchitis 05/08/2015   Hypokalemia 05/08/2015   Hypomagnesemia 05/08/2015   Emesis    Atrial fibrillation with RVR (HCC) 05/07/2015   Chest pain 05/07/2015   Anemia 05/07/2015   FIBROIDS, UTERUS 07/11/2009   PELVIC PAIN, RIGHT 06/30/2009   FEMALE INFERTILITY 10/21/2008   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    Operations: Procedure(s): ROBOTIC SLEEVE GASTRECTOMY UPPER GI ENDOSCOPY  Admission Condition: good  Discharged  Condition: good  Indication for Admission: Obesity  Hospital Course: Sleeve gastrectomy  Consults: None  Significant Diagnostic Studies: None  Treatments: surgery: as above  Disposition: Home  Patient Instructions:  Allergies as of 02/05/2023   No Known Allergies      Medication List     STOP taking these medications    naproxen sodium 220 MG tablet Commonly known as: ALEVE   Naproxen Sodium 375 MG Tb24       TAKE these medications    acetaminophen 500 MG tablet Commonly known as: TYLENOL Take 2 tablets (1,000 mg total) by mouth every 8 (eight) hours for 5 days.   cyclobenzaprine 10 MG tablet Commonly known as: FLEXERIL Take 1 tablet (10 mg total) by mouth 3 (three) times daily as needed for muscle spasms   cyclobenzaprine 10 MG tablet Commonly known as: FLEXERIL Take 1 tablet (10 mg total) by mouth 3 (three) times daily as needed for muscle spasms.   gabapentin 100 MG capsule Commonly known as: NEURONTIN Take 1 capsule (100 mg total) by mouth every 12 (twelve) hours for 5 days.   lisinopril 10 MG tablet Commonly known as: ZESTRIL Take 1 tablet (10 mg total) by mouth every evening.   naloxone 4 MG/0.1ML Liqd nasal spray kit Commonly known as: Narcan Spray 1 spray as needed as directed.   ondansetron 4 MG disintegrating tablet Commonly known as: ZOFRAN-ODT Take 1 tablet (4 mg total) by mouth every 6 (six) hours as needed for nausea or vomiting.   oxyCODONE 5 MG immediate release tablet Commonly known as: Oxy IR/ROXICODONE Take 1 tablet (5 mg total) by mouth every 6 (six) hours  as needed for breakthrough pain or severe pain.   oxyCODONE-acetaminophen 10-325 MG tablet Commonly known as: Percocet Take 1 tablet by mouth 5 (five) times daily as needed for pain.   oxyCODONE-acetaminophen 10-325 MG tablet Commonly known as: Percocet Take 1 tablet by mouth 5 (five) times daily as needed for pain   Ozempic (1 MG/DOSE) 4 MG/3ML Sopn Generic drug:  Semaglutide (1 MG/DOSE) Inject 1 mg into the skin once a week.   pantoprazole 40 MG tablet Commonly known as: PROTONIX Take 1 tablet (40 mg total) by mouth daily.   Vitamin D (Ergocalciferol) 1.25 MG (50000 UNIT) Caps capsule Commonly known as: DRISDOL Take 1 capsule (50,000 Units total) by mouth once a week. What changed: when to take this        Activity: no heavy lifting for 4 weeks Diet:  Bariatric diet Wound Care: keep wound clean and dry  Follow-up:  With Dr. Dossie Der  Signed: Hyman Hopes Yazmyne Sara [facility] Bariatric, [facility] Minimally Invasive [facility] [place]   02/05/2023, 12:20 PM

## 2023-02-05 NOTE — Progress Notes (Signed)
Patient alert and oriented, pain is controlled. Patient is tolerating fluids, advanced to protein shake today, patient is tolerating well. Reviewed Gastric sleeve/bypass discharge instructions with patient and patient is able to articulate understanding. Provided information on BELT program, Support Group, [facility], and [facility] outpatient pharmacy. Communicated general update of patient status to surgeon. All questions answered. 24hr fluid recall is 360 mL per hydration protocol, bariatric nurse coordinator to make follow-up phone call within one week.

## 2023-02-05 NOTE — Progress Notes (Signed)
Reviewed written d/c instructions w pt and all questions answered. She verbalized understanding. D/C via w/c w all belongings in stable condition. 

## 2023-02-05 NOTE — Plan of Care (Signed)
  Problem: Education: Goal: Ability to state signs and symptoms to report to health care provider will improve Outcome: Adequate for Discharge Goal: Knowledge of the prescribed self-care regimen will improve Outcome: Adequate for Discharge Goal: Knowledge of discharge needs will improve Outcome: Adequate for Discharge   Problem: Activity: Goal: Ability to tolerate increased activity will improve Outcome: Adequate for Discharge   Problem: Bowel/Gastric: Goal: Gastrointestinal status for postoperative course will improve Outcome: Adequate for Discharge Goal: Occurrences of nausea will decrease Outcome: Adequate for Discharge   Problem: Coping: Goal: Development of coping mechanisms to deal with changes in body function or appearance will improve Outcome: Adequate for Discharge   Problem: Fluid Volume: Goal: Maintenance of adequate hydration will improve Outcome: Adequate for Discharge   Problem: Nutritional: Goal: Nutritional status will improve Outcome: Adequate for Discharge   Problem: Clinical Measurements: Goal: Will show no signs or symptoms of venous thromboembolism Outcome: Adequate for Discharge Goal: Will remain free from infection Outcome: Adequate for Discharge Goal: Will show no signs of GI Leak Outcome: Adequate for Discharge   Problem: Respiratory: Goal: Will regain and/or maintain adequate ventilation Outcome: Adequate for Discharge   Problem: Pain Management: Goal: Pain level will decrease Outcome: Adequate for Discharge   Problem: Skin Integrity: Goal: Demonstration of wound healing without infection will improve Outcome: Adequate for Discharge   Problem: Education: Goal: Knowledge of General Education information will improve Description: Including pain rating scale, medication(s)/side effects and non-pharmacologic comfort measures Outcome: Adequate for Discharge   Problem: Health Behavior/Discharge Planning: Goal: Ability to manage  health-related needs will improve Outcome: Adequate for Discharge   Problem: Clinical Measurements: Goal: Ability to maintain clinical measurements within normal limits will improve Outcome: Adequate for Discharge Goal: Will remain free from infection Outcome: Adequate for Discharge Goal: Diagnostic test results will improve Outcome: Adequate for Discharge Goal: Respiratory complications will improve Outcome: Adequate for Discharge Goal: Cardiovascular complication will be avoided Outcome: Adequate for Discharge   Problem: Activity: Goal: Risk for activity intolerance will decrease Outcome: Adequate for Discharge   Problem: Nutrition: Goal: Adequate nutrition will be maintained Outcome: Adequate for Discharge   Problem: Coping: Goal: Level of anxiety will decrease Outcome: Adequate for Discharge   Problem: Elimination: Goal: Will not experience complications related to bowel motility Outcome: Adequate for Discharge Goal: Will not experience complications related to urinary retention Outcome: Adequate for Discharge   Problem: Pain Managment: Goal: General experience of comfort will improve Outcome: Adequate for Discharge   Problem: Safety: Goal: Ability to remain free from injury will improve Outcome: Adequate for Discharge   Problem: Skin Integrity: Goal: Risk for impaired skin integrity will decrease Outcome: Adequate for Discharge   

## 2023-02-05 NOTE — Discharge Instructions (Signed)

## 2023-02-05 NOTE — TOC CM/SW Note (Signed)
TOC Brief Assessment   Insurance and Status Insurance coverage has been reviewed  Patient has primary care physician Yes  Home environment has been reviewed Yes  Prior level of function: Independent  Prior/Current Home Services No current home services  Social Determinants of Health Reivew SDOH reviewed no interventions necessary  Readmission risk has been reviewed Yes  Transition of care needs no transition of care needs at this time

## 2023-02-12 ENCOUNTER — Other Ambulatory Visit (HOSPITAL_COMMUNITY): Payer: Self-pay

## 2023-02-13 ENCOUNTER — Telehealth (HOSPITAL_COMMUNITY): Payer: Self-pay | Admitting: *Deleted

## 2023-02-13 NOTE — Telephone Encounter (Signed)
Call went to voicemail.  Return number provided.

## 2023-02-15 ENCOUNTER — Telehealth (HOSPITAL_COMMUNITY): Payer: Self-pay | Admitting: *Deleted

## 2023-02-15 NOTE — Telephone Encounter (Signed)
1. Tell me about your pain and pain management?     Pt did not have c/o any pain.     2. Let's talk about fluid intake. How much total fluid are you taking in?   Pt states that s/he is working to meet goal of 64 oz of fluid today. Pt encouraged to continue to work towards meeting goal. Pt instructed to assess status and suggestions daily utilizing Hydration Action Plan on discharge folder and to call [facility] if in the "red zone".    3. How much protein have you taken in the last day?     Pt states that she is working to meet the goal of 60g of protein today. Pt stated she is looking forward to advancing her diet, pt stated she is not as fond of the bariatric shakes. Pt plans to drink the reminder of goal throughout the day to meet criteria.    4. Tell me what your incisions look like?   "Incisions look fine". Pt denies a fever, chills. Pt states incisions are not swollen, open, or draining. Pt encouraged to call [facility] if incisions change.   5. Have you been passing gas? BM?   Pt states that they are having BMs. Last BM the week the surgery occurred, pt has started taking miralax. She stated that she took 1 capful yesterday and one today. Encouraged pt to take the miralax or use MOM until a bm occurs. Pt stated that she has been passing gas and does feel like it is coming.  6. If a problem or question were to arise who would you call? Do you know contact numbers for [facility], [facility], and [facility]?   Pt knows to call [facility] for surgical, [facility] for nutrition, and [facility] for non-urgent questions or concerns. Pt denies dehydration symptoms. Pt can describe s/sx of dehydration.   7. How has the walking going?   Pt states s/he is walking around and able to be active without difficulty.   8. How are your vitamins and calcium going? How are you taking them?    Pt states that s/he is taking his/her supplements and vitamins without difficulty.

## 2023-02-19 ENCOUNTER — Encounter: Payer: BC Managed Care – PPO | Attending: Surgery | Admitting: Dietician

## 2023-02-19 ENCOUNTER — Encounter: Payer: Self-pay | Admitting: Dietician

## 2023-02-19 VITALS — Ht 64.0 in | Wt 256.2 lb

## 2023-02-19 DIAGNOSIS — Z713 Dietary counseling and surveillance: Secondary | ICD-10-CM | POA: Diagnosis not present

## 2023-02-19 DIAGNOSIS — E669 Obesity, unspecified: Secondary | ICD-10-CM

## 2023-02-19 LAB — SURGICAL PATHOLOGY

## 2023-02-19 NOTE — Progress Notes (Signed)
2 Week Post-Operative Nutrition Class   Patient was seen on 02/19/2023 for Post-Operative Nutrition education at the [facility]    Surgery date: 02/04/2023 Surgery type: Sleeve Gastrectomy  Anthropometrics  Start weight at [facility]: 276.3 lbs (date: 12/03/2022)  Height: 64 in Weight today: 256.2 lbs BMI: 43.98 kg/m2     Clinical   Medical hx: HTN, arthritis Medications: oxycodone, Flexeril, vit D, lisinopril Labs: not available in EMR Notable signs/symptoms: none noted Any previous deficiencies? No Bowel Habits: Every day to every other day no complaints   Body Composition Scale 02/19/2023  Current Body Weight 256.2  Total Body Fat % 47.4  Visceral Fat 19  Fat-Free Mass % 52.5   Total Body Water % 40.7  Muscle-Mass lbs 29.9  BMI 43.9  Body Fat Displacement          Torso  lbs 75.3         Left Leg  lbs 15.0         Right Leg  lbs 15.0         Left Arm  lbs 7.5         Right Arm  lbs 7.5     The following the learning objectives were met by the patient during this course: Identifies Soft Prepped Plan Advancement Guide  Identifies Soft, High Proteins (Phase 1), beginning 2 weeks post-operatively to 3 weeks post-operatively Identifies Additional Soft High Proteins, soft non-starchy vegetables, fruits and starches (Phase 2), beginning 3 weeks post-operatively to 3 months post-operatively Identifies appropriate sources of fluids, proteins, vegetables, fruits and starches Identifies appropriate fat sources and healthy verses unhealthy fat types   States protein, vegetable, fruit and starch recommendations and appropriate sources post-operatively Identifies the need for appropriate texture modifications, mastication, and bite sizes when consuming solids Identifies appropriate fat consumption and sources Identifies appropriate multivitamin and calcium sources post-operatively Describes the need for physical activity post-operatively and will follow  MD recommendations States when to call healthcare provider regarding medication questions or post-operative complications   Handouts given during class include: Soft Prepped Plan Advancement Guide   Follow-Up Plan: Patient will follow-up at [facility] in 10 weeks for 3 month post-op nutrition visit for diet advancement per MD.

## 2023-02-26 ENCOUNTER — Other Ambulatory Visit (HOSPITAL_COMMUNITY): Payer: Self-pay

## 2023-02-26 MED ORDER — OXYCODONE-ACETAMINOPHEN 10-325 MG PO TABS
1.0000 | ORAL_TABLET | Freq: Every day | ORAL | 0 refills | Status: AC | PRN
  Filled 2023-02-26: qty 150, 32d supply, fill #0
  Filled 2023-03-12: qty 150, 30d supply, fill #0

## 2023-02-26 MED ORDER — CYCLOBENZAPRINE HCL 10 MG PO TABS
10.0000 mg | ORAL_TABLET | Freq: Three times a day (TID) | ORAL | 0 refills | Status: AC | PRN
  Filled 2023-02-26 – 2023-03-12 (×2): qty 90, 30d supply, fill #0

## 2023-02-28 ENCOUNTER — Telehealth: Payer: Self-pay | Admitting: Dietician

## 2023-02-28 NOTE — Telephone Encounter (Signed)
RD called pt to verify fluid intake once starting soft, solid proteins 2 week post-bariatric surgery.   Daily Fluid intake: 64 oz. fluid Daily Protein intake: 60 grams Bowel Habits: good  Concerns/issues: no issues

## 2023-03-08 ENCOUNTER — Other Ambulatory Visit (HOSPITAL_COMMUNITY): Payer: Self-pay

## 2023-03-09 ENCOUNTER — Other Ambulatory Visit (HOSPITAL_COMMUNITY): Payer: Self-pay

## 2023-03-12 ENCOUNTER — Other Ambulatory Visit (HOSPITAL_COMMUNITY): Payer: Self-pay

## 2023-03-12 ENCOUNTER — Other Ambulatory Visit: Payer: Self-pay

## 2023-03-26 ENCOUNTER — Other Ambulatory Visit (HOSPITAL_COMMUNITY): Payer: Self-pay

## 2023-03-26 MED ORDER — OXYCODONE-ACETAMINOPHEN 10-325 MG PO TABS
1.0000 | ORAL_TABLET | Freq: Every day | ORAL | 0 refills | Status: AC
Start: 2023-03-26 — End: ?
  Filled 2023-04-10: qty 150, 30d supply, fill #0

## 2023-03-26 MED ORDER — ERGOCALCIFEROL 1.25 MG (50000 UT) PO CAPS
50000.0000 [IU] | ORAL_CAPSULE | ORAL | 1 refills | Status: AC
  Filled 2023-03-26: qty 12, 84d supply, fill #0

## 2023-03-26 MED ORDER — CYCLOBENZAPRINE HCL 10 MG PO TABS
10.0000 mg | ORAL_TABLET | Freq: Three times a day (TID) | ORAL | 0 refills | Status: AC | PRN
  Filled 2023-04-10: qty 90, 30d supply, fill #0

## 2023-03-27 ENCOUNTER — Other Ambulatory Visit: Payer: Self-pay

## 2023-03-27 ENCOUNTER — Other Ambulatory Visit (HOSPITAL_COMMUNITY): Payer: Self-pay

## 2023-03-27 MED ORDER — DICLOFENAC SODIUM 3 % EX GEL
CUTANEOUS | 3 refills | Status: AC
Start: 2023-03-26 — End: ?
  Filled 2023-03-27 – 2023-04-10 (×2): qty 100, 30d supply, fill #0

## 2023-03-29 ENCOUNTER — Other Ambulatory Visit (HOSPITAL_COMMUNITY): Payer: Self-pay

## 2023-04-06 ENCOUNTER — Other Ambulatory Visit (HOSPITAL_COMMUNITY): Payer: Self-pay

## 2023-04-10 ENCOUNTER — Other Ambulatory Visit (HOSPITAL_COMMUNITY): Payer: Self-pay

## 2023-04-24 ENCOUNTER — Other Ambulatory Visit (HOSPITAL_COMMUNITY): Payer: Self-pay

## 2023-04-24 MED ORDER — NALOXONE HCL 4 MG/0.1ML NA LIQD
NASAL | 3 refills | Status: AC
  Filled 2023-04-24: qty 2, 30d supply, fill #0

## 2023-04-24 MED ORDER — CYCLOBENZAPRINE HCL 10 MG PO TABS
10.0000 mg | ORAL_TABLET | Freq: Three times a day (TID) | ORAL | 0 refills | Status: AC | PRN
  Filled 2023-04-24 – 2023-05-08 (×2): qty 90, 30d supply, fill #0

## 2023-04-24 MED ORDER — OXYCODONE-ACETAMINOPHEN 10-325 MG PO TABS
1.0000 | ORAL_TABLET | Freq: Every day | ORAL | 0 refills | Status: DC | PRN
  Filled 2023-05-08: qty 150, 30d supply, fill #0

## 2023-04-25 ENCOUNTER — Other Ambulatory Visit (HOSPITAL_COMMUNITY): Payer: Self-pay

## 2023-05-01 ENCOUNTER — Other Ambulatory Visit (HOSPITAL_COMMUNITY): Payer: Self-pay

## 2023-05-03 ENCOUNTER — Other Ambulatory Visit (HOSPITAL_COMMUNITY): Payer: Self-pay

## 2023-05-07 ENCOUNTER — Ambulatory Visit: Payer: BC Managed Care – PPO | Admitting: Dietician

## 2023-05-08 ENCOUNTER — Other Ambulatory Visit (HOSPITAL_COMMUNITY): Payer: Self-pay

## 2023-05-24 ENCOUNTER — Other Ambulatory Visit (HOSPITAL_COMMUNITY): Payer: Self-pay

## 2023-05-24 MED ORDER — DICLOFENAC SODIUM 3 % EX GEL
CUTANEOUS | 3 refills | Status: AC
  Filled 2023-05-24: qty 100, 30d supply, fill #0
  Filled 2023-06-06: qty 100, 50d supply, fill #0

## 2023-05-24 MED ORDER — CYCLOBENZAPRINE HCL 10 MG PO TABS
10.0000 mg | ORAL_TABLET | Freq: Three times a day (TID) | ORAL | 0 refills | Status: AC | PRN
  Filled 2023-05-24 – 2023-06-06 (×2): qty 90, 30d supply, fill #0

## 2023-05-24 MED ORDER — VITAMIN D (ERGOCALCIFEROL) 1.25 MG (50000 UNIT) PO CAPS
50000.0000 [IU] | ORAL_CAPSULE | ORAL | 1 refills | Status: AC
  Filled 2023-05-24 – 2023-06-06 (×2): qty 12, 84d supply, fill #0

## 2023-05-24 MED ORDER — OXYCODONE-ACETAMINOPHEN 10-325 MG PO TABS
1.0000 | ORAL_TABLET | Freq: Every day | ORAL | 0 refills | Status: AC | PRN
  Filled 2023-06-06: qty 150, 30d supply, fill #0

## 2023-05-27 ENCOUNTER — Other Ambulatory Visit (HOSPITAL_COMMUNITY): Payer: Self-pay

## 2023-05-29 ENCOUNTER — Other Ambulatory Visit (HOSPITAL_COMMUNITY): Payer: Self-pay

## 2023-06-06 ENCOUNTER — Other Ambulatory Visit (HOSPITAL_COMMUNITY): Payer: Self-pay

## 2023-06-07 ENCOUNTER — Other Ambulatory Visit (HOSPITAL_COMMUNITY): Payer: Self-pay

## 2023-06-10 ENCOUNTER — Other Ambulatory Visit (HOSPITAL_COMMUNITY): Payer: Self-pay

## 2023-06-21 ENCOUNTER — Other Ambulatory Visit (HOSPITAL_COMMUNITY): Payer: Self-pay

## 2023-06-21 MED ORDER — CYCLOBENZAPRINE HCL 10 MG PO TABS
10.0000 mg | ORAL_TABLET | Freq: Three times a day (TID) | ORAL | 0 refills | Status: AC | PRN
  Filled 2023-06-21 – 2023-08-30 (×2): qty 90, 30d supply, fill #0

## 2023-06-21 MED ORDER — OXYCODONE-ACETAMINOPHEN 10-325 MG PO TABS
1.0000 | ORAL_TABLET | Freq: Every day | ORAL | 0 refills | Status: AC | PRN
  Filled 2023-07-05: qty 150, 30d supply, fill #0

## 2023-06-21 MED ORDER — ERGOCALCIFEROL 1.25 MG (50000 UT) PO CAPS
50000.0000 [IU] | ORAL_CAPSULE | ORAL | 1 refills | Status: AC
  Filled 2023-06-21: qty 12, 84d supply, fill #0

## 2023-06-21 MED ORDER — DICLOFENAC SODIUM 3 % EX GEL
CUTANEOUS | 3 refills | Status: AC
  Filled 2023-06-21: qty 100, 30d supply, fill #0

## 2023-06-22 ENCOUNTER — Other Ambulatory Visit (HOSPITAL_BASED_OUTPATIENT_CLINIC_OR_DEPARTMENT_OTHER): Payer: Self-pay

## 2023-06-25 ENCOUNTER — Other Ambulatory Visit (HOSPITAL_COMMUNITY): Payer: Self-pay

## 2023-06-26 ENCOUNTER — Telehealth: Payer: Self-pay | Admitting: Audiologist

## 2023-06-28 ENCOUNTER — Telehealth: Payer: Self-pay | Admitting: Audiologist

## 2023-07-01 ENCOUNTER — Ambulatory Visit: Payer: 59 | Admitting: Audiologist

## 2023-07-02 ENCOUNTER — Other Ambulatory Visit (HOSPITAL_COMMUNITY): Payer: Self-pay

## 2023-07-05 ENCOUNTER — Other Ambulatory Visit: Payer: Self-pay

## 2023-07-05 ENCOUNTER — Other Ambulatory Visit (HOSPITAL_COMMUNITY): Payer: Self-pay

## 2023-07-17 ENCOUNTER — Other Ambulatory Visit (HOSPITAL_COMMUNITY): Payer: Self-pay

## 2023-07-17 MED ORDER — OXYCODONE-ACETAMINOPHEN 10-325 MG PO TABS
1.0000 | ORAL_TABLET | Freq: Every day | ORAL | 0 refills | Status: DC
  Filled 2023-08-05: qty 150, 30d supply, fill #0

## 2023-07-17 MED ORDER — ERGOCALCIFEROL 1.25 MG (50000 UT) PO CAPS
50000.0000 [IU] | ORAL_CAPSULE | ORAL | 1 refills | Status: AC
  Filled 2023-07-17: qty 12, 84d supply, fill #0

## 2023-07-17 MED ORDER — CYCLOBENZAPRINE HCL 10 MG PO TABS
10.0000 mg | ORAL_TABLET | Freq: Three times a day (TID) | ORAL | 0 refills | Status: AC | PRN
  Filled 2023-07-17 – 2023-09-30 (×2): qty 90, 30d supply, fill #0

## 2023-07-17 MED ORDER — NALOXONE HCL 4 MG/0.1ML NA LIQD
NASAL | 3 refills | Status: AC
  Filled 2023-07-17: qty 2, 2d supply, fill #0

## 2023-07-18 ENCOUNTER — Other Ambulatory Visit (HOSPITAL_COMMUNITY): Payer: Self-pay

## 2023-07-24 ENCOUNTER — Other Ambulatory Visit (HOSPITAL_COMMUNITY): Payer: Self-pay

## 2023-07-27 ENCOUNTER — Other Ambulatory Visit (HOSPITAL_COMMUNITY): Payer: Self-pay

## 2023-07-29 ENCOUNTER — Other Ambulatory Visit (HOSPITAL_COMMUNITY): Payer: Self-pay

## 2023-08-01 ENCOUNTER — Other Ambulatory Visit (HOSPITAL_COMMUNITY): Payer: Self-pay

## 2023-08-05 ENCOUNTER — Other Ambulatory Visit (HOSPITAL_COMMUNITY): Payer: Self-pay

## 2023-08-08 ENCOUNTER — Encounter: Payer: Self-pay | Admitting: Gastroenterology

## 2023-08-16 ENCOUNTER — Other Ambulatory Visit (HOSPITAL_COMMUNITY): Payer: Self-pay

## 2023-08-16 MED ORDER — CYCLOBENZAPRINE HCL 10 MG PO TABS
10.0000 mg | ORAL_TABLET | Freq: Three times a day (TID) | ORAL | 0 refills | Status: AC | PRN
  Filled 2023-08-16 – 2023-10-28 (×2): qty 90, 30d supply, fill #0

## 2023-08-16 MED ORDER — ERGOCALCIFEROL 1.25 MG (50000 UT) PO CAPS
50000.0000 [IU] | ORAL_CAPSULE | ORAL | 1 refills | Status: AC
  Filled 2023-08-16: qty 12, 84d supply, fill #0

## 2023-08-16 MED ORDER — OXYCODONE-ACETAMINOPHEN 10-325 MG PO TABS
1.0000 | ORAL_TABLET | Freq: Every day | ORAL | 0 refills | Status: AC
  Filled 2023-08-16 – 2023-09-02 (×2): qty 150, 30d supply, fill #0

## 2023-08-17 ENCOUNTER — Other Ambulatory Visit (HOSPITAL_COMMUNITY): Payer: Self-pay

## 2023-08-20 ENCOUNTER — Other Ambulatory Visit (HOSPITAL_COMMUNITY): Payer: Self-pay

## 2023-08-22 ENCOUNTER — Other Ambulatory Visit (HOSPITAL_COMMUNITY): Payer: Self-pay

## 2023-08-27 ENCOUNTER — Other Ambulatory Visit (HOSPITAL_COMMUNITY): Payer: Self-pay

## 2023-08-30 ENCOUNTER — Other Ambulatory Visit (HOSPITAL_COMMUNITY): Payer: Self-pay

## 2023-09-02 ENCOUNTER — Other Ambulatory Visit (HOSPITAL_COMMUNITY): Payer: Self-pay

## 2023-09-16 ENCOUNTER — Other Ambulatory Visit (HOSPITAL_COMMUNITY): Payer: Self-pay

## 2023-09-16 MED ORDER — OXYCODONE-ACETAMINOPHEN 10-325 MG PO TABS
1.0000 | ORAL_TABLET | Freq: Every day | ORAL | 0 refills | Status: DC | PRN
  Filled 2023-09-16 – 2023-09-30 (×2): qty 150, 30d supply, fill #0

## 2023-09-16 MED ORDER — CYCLOBENZAPRINE HCL 10 MG PO TABS
10.0000 mg | ORAL_TABLET | Freq: Three times a day (TID) | ORAL | 0 refills | Status: AC | PRN
  Filled 2023-09-16 – 2024-05-13 (×2): qty 90, 30d supply, fill #0

## 2023-09-16 MED ORDER — NALOXONE HCL 4 MG/0.1ML NA LIQD
1.0000 | NASAL | 3 refills | Status: AC | PRN
  Filled 2023-09-16: qty 2, 1d supply, fill #0

## 2023-09-30 ENCOUNTER — Other Ambulatory Visit (HOSPITAL_COMMUNITY): Payer: Self-pay

## 2023-09-30 ENCOUNTER — Other Ambulatory Visit (HOSPITAL_BASED_OUTPATIENT_CLINIC_OR_DEPARTMENT_OTHER): Payer: Self-pay

## 2023-10-11 ENCOUNTER — Other Ambulatory Visit (HOSPITAL_COMMUNITY): Payer: Self-pay

## 2023-10-11 MED ORDER — ERGOCALCIFEROL 1.25 MG (50000 UT) PO CAPS
50000.0000 [IU] | ORAL_CAPSULE | ORAL | 1 refills | Status: AC
  Filled 2023-10-11 – 2023-10-28 (×2): qty 12, 84d supply, fill #0
  Filled 2024-01-21 (×2): qty 12, 84d supply, fill #1

## 2023-10-11 MED ORDER — CYCLOBENZAPRINE HCL 10 MG PO TABS
10.0000 mg | ORAL_TABLET | Freq: Three times a day (TID) | ORAL | 0 refills | Status: AC
Start: 2023-10-11 — End: ?
  Filled 2024-02-18: qty 90, 30d supply, fill #0

## 2023-10-11 MED ORDER — OXYCODONE-ACETAMINOPHEN 10-325 MG PO TABS
1.0000 | ORAL_TABLET | Freq: Every day | ORAL | 0 refills | Status: AC | PRN
  Filled 2023-10-28: qty 150, 30d supply, fill #0

## 2023-10-17 ENCOUNTER — Other Ambulatory Visit (HOSPITAL_COMMUNITY): Payer: Self-pay

## 2023-10-21 ENCOUNTER — Other Ambulatory Visit (HOSPITAL_COMMUNITY): Payer: Self-pay

## 2023-10-28 ENCOUNTER — Other Ambulatory Visit (HOSPITAL_COMMUNITY): Payer: Self-pay

## 2023-11-11 ENCOUNTER — Other Ambulatory Visit (HOSPITAL_COMMUNITY): Payer: Self-pay

## 2023-11-11 MED ORDER — CYCLOBENZAPRINE HCL 10 MG PO TABS
10.0000 mg | ORAL_TABLET | Freq: Three times a day (TID) | ORAL | 0 refills | Status: AC | PRN
  Filled 2023-11-11 – 2023-11-22 (×4): qty 90, 30d supply, fill #0

## 2023-11-11 MED ORDER — OXYCODONE-ACETAMINOPHEN 10-325 MG PO TABS
1.0000 | ORAL_TABLET | Freq: Every day | ORAL | 0 refills | Status: AC
  Filled 2023-11-19 – 2023-11-25 (×4): qty 150, 30d supply, fill #0

## 2023-11-11 MED ORDER — ERGOCALCIFEROL 1.25 MG (50000 UT) PO CAPS
1.0000 | ORAL_CAPSULE | ORAL | 1 refills | Status: AC
  Filled 2023-11-11 – 2023-11-19 (×3): qty 12, 84d supply, fill #0

## 2023-11-12 ENCOUNTER — Other Ambulatory Visit (HOSPITAL_COMMUNITY): Payer: Self-pay

## 2023-11-19 ENCOUNTER — Other Ambulatory Visit (HOSPITAL_COMMUNITY): Payer: Self-pay

## 2023-11-22 ENCOUNTER — Other Ambulatory Visit (HOSPITAL_COMMUNITY): Payer: Self-pay

## 2023-11-22 ENCOUNTER — Other Ambulatory Visit (HOSPITAL_BASED_OUTPATIENT_CLINIC_OR_DEPARTMENT_OTHER): Payer: Self-pay

## 2023-11-22 ENCOUNTER — Other Ambulatory Visit: Payer: Self-pay

## 2023-11-25 ENCOUNTER — Other Ambulatory Visit: Payer: Self-pay

## 2023-11-25 ENCOUNTER — Other Ambulatory Visit (HOSPITAL_COMMUNITY): Payer: Self-pay

## 2023-12-09 ENCOUNTER — Other Ambulatory Visit (HOSPITAL_COMMUNITY): Payer: Self-pay

## 2023-12-09 MED ORDER — CYCLOBENZAPRINE HCL 10 MG PO TABS
10.0000 mg | ORAL_TABLET | Freq: Three times a day (TID) | ORAL | 0 refills | Status: AC
  Filled 2023-12-09 – 2024-01-21 (×3): qty 90, 30d supply, fill #0

## 2023-12-09 MED ORDER — DICLOFENAC SODIUM 3 % EX GEL
1.0000 | Freq: Two times a day (BID) | CUTANEOUS | 3 refills | Status: AC
  Filled 2023-12-09: qty 100, 30d supply, fill #0

## 2023-12-09 MED ORDER — OXYCODONE-ACETAMINOPHEN 10-325 MG PO TABS
1.0000 | ORAL_TABLET | Freq: Every day | ORAL | 0 refills | Status: DC | PRN
  Filled 2023-12-09 – 2023-12-23 (×2): qty 150, 30d supply, fill #0

## 2023-12-09 MED ORDER — ERGOCALCIFEROL 1.25 MG (50000 UT) PO CAPS
50000.0000 [IU] | ORAL_CAPSULE | ORAL | 1 refills | Status: AC
  Filled 2023-12-09 – 2024-05-11 (×2): qty 12, 84d supply, fill #0

## 2023-12-17 ENCOUNTER — Other Ambulatory Visit (HOSPITAL_COMMUNITY): Payer: Self-pay

## 2023-12-23 ENCOUNTER — Other Ambulatory Visit (HOSPITAL_COMMUNITY): Payer: Self-pay

## 2024-01-06 ENCOUNTER — Other Ambulatory Visit (HOSPITAL_BASED_OUTPATIENT_CLINIC_OR_DEPARTMENT_OTHER): Payer: Self-pay

## 2024-01-06 ENCOUNTER — Other Ambulatory Visit (HOSPITAL_COMMUNITY): Payer: Self-pay

## 2024-01-06 MED ORDER — OXYCODONE-ACETAMINOPHEN 10-325 MG PO TABS
1.0000 | ORAL_TABLET | Freq: Every day | ORAL | 0 refills | Status: AC | PRN
  Filled 2024-01-06: qty 150, 32d supply, fill #0
  Filled 2024-01-21: qty 150, 30d supply, fill #0

## 2024-01-06 MED ORDER — CYCLOBENZAPRINE HCL 10 MG PO TABS
10.0000 mg | ORAL_TABLET | Freq: Three times a day (TID) | ORAL | 0 refills | Status: AC
  Filled 2024-01-06 – 2024-03-26 (×3): qty 90, 30d supply, fill #0

## 2024-01-06 MED ORDER — DICLOFENAC SODIUM 3 % EX GEL
1.0000 | Freq: Two times a day (BID) | CUTANEOUS | 3 refills | Status: AC
  Filled 2024-01-06: qty 100, 30d supply, fill #0

## 2024-01-09 ENCOUNTER — Other Ambulatory Visit (HOSPITAL_COMMUNITY): Payer: Self-pay

## 2024-01-16 ENCOUNTER — Other Ambulatory Visit (HOSPITAL_COMMUNITY): Payer: Self-pay

## 2024-01-21 ENCOUNTER — Other Ambulatory Visit: Payer: Self-pay

## 2024-01-21 ENCOUNTER — Other Ambulatory Visit (HOSPITAL_COMMUNITY): Payer: Self-pay

## 2024-01-30 ENCOUNTER — Other Ambulatory Visit (HOSPITAL_COMMUNITY): Payer: Self-pay

## 2024-01-30 MED ORDER — CYCLOBENZAPRINE HCL 10 MG PO TABS
ORAL_TABLET | ORAL | 0 refills | Status: AC
  Filled 2024-01-30: qty 90, 30d supply, fill #0

## 2024-01-30 MED ORDER — OXYCODONE-ACETAMINOPHEN 10-325 MG PO TABS
ORAL_TABLET | ORAL | 0 refills | Status: DC
  Filled 2024-01-30 – 2024-02-18 (×2): qty 150, 30d supply, fill #0

## 2024-02-11 ENCOUNTER — Other Ambulatory Visit (HOSPITAL_COMMUNITY): Payer: Self-pay

## 2024-02-15 ENCOUNTER — Other Ambulatory Visit (HOSPITAL_COMMUNITY): Payer: Self-pay

## 2024-02-17 ENCOUNTER — Other Ambulatory Visit (HOSPITAL_COMMUNITY): Payer: Self-pay

## 2024-02-17 MED ORDER — LISINOPRIL 10 MG PO TABS
10.0000 mg | ORAL_TABLET | Freq: Every evening | ORAL | 1 refills | Status: AC
  Filled 2024-02-17: qty 90, 90d supply, fill #0
  Filled 2024-02-17 – 2024-03-03 (×3): qty 30, 30d supply, fill #0
  Filled 2024-03-16 – 2024-04-15 (×4): qty 30, 30d supply, fill #1
  Filled 2024-05-11: qty 30, 30d supply, fill #2

## 2024-02-17 MED ORDER — VITAMIN D (ERGOCALCIFEROL) 1.25 MG (50000 UNIT) PO CAPS
50000.0000 [IU] | ORAL_CAPSULE | ORAL | 1 refills | Status: AC
  Filled 2024-02-17 – 2024-03-17 (×3): qty 12, 84d supply, fill #0

## 2024-02-17 MED ORDER — METOPROLOL SUCCINATE ER 25 MG PO TB24
25.0000 mg | ORAL_TABLET | Freq: Every day | ORAL | 1 refills | Status: AC
  Filled 2024-02-17: qty 30, 30d supply, fill #0
  Filled 2024-02-17: qty 90, 90d supply, fill #0
  Filled 2024-03-03: qty 30, 30d supply, fill #0

## 2024-02-18 ENCOUNTER — Other Ambulatory Visit (HOSPITAL_COMMUNITY): Payer: Self-pay

## 2024-03-02 ENCOUNTER — Other Ambulatory Visit (HOSPITAL_COMMUNITY): Payer: Self-pay

## 2024-03-02 MED ORDER — METOPROLOL SUCCINATE ER 25 MG PO TB24
25.0000 mg | ORAL_TABLET | Freq: Every day | ORAL | 1 refills | Status: AC
  Filled 2024-03-02 – 2024-04-15 (×3): qty 90, 90d supply, fill #0

## 2024-03-02 MED ORDER — CYCLOBENZAPRINE HCL 10 MG PO TABS
ORAL_TABLET | ORAL | 0 refills | Status: AC
  Filled 2024-03-02 – 2024-04-15 (×3): qty 90, 30d supply, fill #0

## 2024-03-02 MED ORDER — OXYCODONE-ACETAMINOPHEN 10-325 MG PO TABS
1.0000 | ORAL_TABLET | Freq: Every day | ORAL | 0 refills | Status: AC | PRN
  Filled 2024-03-02 – 2024-03-17 (×2): qty 150, 30d supply, fill #0

## 2024-03-02 MED ORDER — VITAMIN D (ERGOCALCIFEROL) 1.25 MG (50000 UNIT) PO CAPS
ORAL_CAPSULE | ORAL | 1 refills | Status: AC
  Filled 2024-03-02: qty 12, 90d supply, fill #0

## 2024-03-03 ENCOUNTER — Other Ambulatory Visit (HOSPITAL_COMMUNITY): Payer: Self-pay

## 2024-03-16 ENCOUNTER — Other Ambulatory Visit (HOSPITAL_COMMUNITY): Payer: Self-pay

## 2024-03-17 ENCOUNTER — Other Ambulatory Visit (HOSPITAL_COMMUNITY): Payer: Self-pay

## 2024-03-17 ENCOUNTER — Other Ambulatory Visit: Payer: Self-pay

## 2024-03-26 ENCOUNTER — Other Ambulatory Visit (HOSPITAL_COMMUNITY): Payer: Self-pay

## 2024-03-26 ENCOUNTER — Other Ambulatory Visit: Payer: Self-pay

## 2024-03-31 ENCOUNTER — Other Ambulatory Visit (HOSPITAL_COMMUNITY): Payer: Self-pay

## 2024-03-31 MED ORDER — OXYCODONE-ACETAMINOPHEN 10-325 MG PO TABS
1.0000 | ORAL_TABLET | Freq: Every day | ORAL | 0 refills | Status: DC
  Filled 2024-04-15: qty 150, 30d supply, fill #0

## 2024-04-01 ENCOUNTER — Other Ambulatory Visit (HOSPITAL_COMMUNITY): Payer: Self-pay

## 2024-04-05 ENCOUNTER — Other Ambulatory Visit (HOSPITAL_COMMUNITY): Payer: Self-pay

## 2024-04-09 ENCOUNTER — Other Ambulatory Visit (HOSPITAL_COMMUNITY): Payer: Self-pay

## 2024-04-15 ENCOUNTER — Other Ambulatory Visit (HOSPITAL_COMMUNITY): Payer: Self-pay

## 2024-04-28 ENCOUNTER — Other Ambulatory Visit (HOSPITAL_COMMUNITY): Payer: Self-pay

## 2024-04-28 MED ORDER — CYCLOBENZAPRINE HCL 10 MG PO TABS: 10.0000 mg | ORAL_TABLET | Freq: Three times a day (TID) | ORAL | 0 refills | Status: AC

## 2024-04-28 MED ORDER — LISINOPRIL 20 MG PO TABS
20.0000 mg | ORAL_TABLET | Freq: Every evening | ORAL | 1 refills | Status: AC
  Filled 2024-04-28 – 2024-05-11 (×2): qty 90, 90d supply, fill #0

## 2024-04-28 MED ORDER — OXYCODONE-ACETAMINOPHEN 10-325 MG PO TABS
1.0000 | ORAL_TABLET | Freq: Every day | ORAL | 0 refills | Status: DC
  Filled 2024-05-13: qty 150, 30d supply, fill #0

## 2024-04-28 MED ORDER — VITAMIN D (ERGOCALCIFEROL) 1.25 MG (50000 UNIT) PO CAPS
50000.0000 [IU] | ORAL_CAPSULE | ORAL | 1 refills | Status: AC
  Filled 2024-04-28: qty 12, 84d supply, fill #0

## 2024-05-04 ENCOUNTER — Other Ambulatory Visit (HOSPITAL_COMMUNITY): Payer: Self-pay

## 2024-05-08 ENCOUNTER — Other Ambulatory Visit (HOSPITAL_COMMUNITY): Payer: Self-pay

## 2024-05-11 ENCOUNTER — Other Ambulatory Visit (HOSPITAL_COMMUNITY): Payer: Self-pay

## 2024-05-11 ENCOUNTER — Other Ambulatory Visit: Payer: Self-pay

## 2024-05-13 ENCOUNTER — Other Ambulatory Visit (HOSPITAL_COMMUNITY): Payer: Self-pay

## 2024-05-13 ENCOUNTER — Other Ambulatory Visit: Payer: Self-pay

## 2024-05-26 ENCOUNTER — Other Ambulatory Visit (HOSPITAL_COMMUNITY): Payer: Self-pay

## 2024-05-27 ENCOUNTER — Other Ambulatory Visit (HOSPITAL_COMMUNITY): Payer: Self-pay

## 2024-05-27 MED ORDER — CYCLOBENZAPRINE HCL 10 MG PO TABS
10.0000 mg | ORAL_TABLET | Freq: Three times a day (TID) | ORAL | 0 refills | Status: AC | PRN
  Filled 2024-05-27 – 2024-06-09 (×2): qty 90, 30d supply, fill #0

## 2024-05-27 MED ORDER — OXYCODONE-ACETAMINOPHEN 10-325 MG PO TABS
1.0000 | ORAL_TABLET | Freq: Every day | ORAL | 0 refills | Status: AC
  Filled 2024-05-27 – 2024-06-10 (×2): qty 150, 30d supply, fill #0

## 2024-05-28 ENCOUNTER — Other Ambulatory Visit (HOSPITAL_COMMUNITY): Payer: Self-pay

## 2024-05-30 ENCOUNTER — Other Ambulatory Visit (HOSPITAL_COMMUNITY): Payer: Self-pay

## 2024-06-05 ENCOUNTER — Other Ambulatory Visit (HOSPITAL_COMMUNITY): Payer: Self-pay

## 2024-06-09 ENCOUNTER — Other Ambulatory Visit (HOSPITAL_COMMUNITY): Payer: Self-pay

## 2024-06-10 ENCOUNTER — Other Ambulatory Visit (HOSPITAL_COMMUNITY): Payer: Self-pay
